# Patient Record
Sex: Male | Born: 1935 | Race: White | Hispanic: No | Marital: Married | Smoking: Never smoker
Health system: Southern US, Community
[De-identification: ages and names within clinical notes are randomized; demographics above are authoritative.]

## PROBLEM LIST (undated history)

## (undated) DIAGNOSIS — I472 Ventricular tachycardia, unspecified: Secondary | ICD-10-CM

## (undated) DIAGNOSIS — F32A Depression, unspecified: Secondary | ICD-10-CM

## (undated) DIAGNOSIS — M1990 Unspecified osteoarthritis, unspecified site: Secondary | ICD-10-CM

## (undated) DIAGNOSIS — I4821 Permanent atrial fibrillation: Secondary | ICD-10-CM

## (undated) DIAGNOSIS — I10 Essential (primary) hypertension: Secondary | ICD-10-CM

## (undated) DIAGNOSIS — I2510 Atherosclerotic heart disease of native coronary artery without angina pectoris: Secondary | ICD-10-CM

## (undated) DIAGNOSIS — I255 Ischemic cardiomyopathy: Secondary | ICD-10-CM

## (undated) DIAGNOSIS — N400 Enlarged prostate without lower urinary tract symptoms: Secondary | ICD-10-CM

## (undated) DIAGNOSIS — K219 Gastro-esophageal reflux disease without esophagitis: Secondary | ICD-10-CM

## (undated) DIAGNOSIS — N62 Hypertrophy of breast: Secondary | ICD-10-CM

## (undated) DIAGNOSIS — F329 Major depressive disorder, single episode, unspecified: Secondary | ICD-10-CM

## (undated) DIAGNOSIS — E785 Hyperlipidemia, unspecified: Secondary | ICD-10-CM

## (undated) DIAGNOSIS — F419 Anxiety disorder, unspecified: Secondary | ICD-10-CM

## (undated) HISTORY — DX: Major depressive disorder, single episode, unspecified: F32.9

## (undated) HISTORY — PX: CATARACT EXTRACTION, BILATERAL: SHX1313

## (undated) HISTORY — DX: Gastro-esophageal reflux disease without esophagitis: K21.9

## (undated) HISTORY — DX: Depression, unspecified: F32.A

## (undated) HISTORY — DX: Ischemic cardiomyopathy: I25.5

## (undated) HISTORY — DX: Ventricular tachycardia: I47.2

## (undated) HISTORY — DX: Hypertrophy of breast: N62

## (undated) HISTORY — DX: Anxiety disorder, unspecified: F41.9

## (undated) HISTORY — DX: Essential (primary) hypertension: I10

## (undated) HISTORY — DX: Hyperlipidemia, unspecified: E78.5

## (undated) HISTORY — PX: ABLATION OF DYSRHYTHMIC FOCUS: SHX254

## (undated) HISTORY — PX: CARDIAC DEFIBRILLATOR REMOVAL: SHX1291

## (undated) HISTORY — DX: Atherosclerotic heart disease of native coronary artery without angina pectoris: I25.10

## (undated) HISTORY — DX: Benign prostatic hyperplasia without lower urinary tract symptoms: N40.0

## (undated) HISTORY — DX: Ventricular tachycardia, unspecified: I47.20

## (undated) HISTORY — DX: Unspecified osteoarthritis, unspecified site: M19.90

---

## 1998-10-04 HISTORY — PX: CORONARY ARTERY BYPASS GRAFT: SHX141

## 1999-02-12 ENCOUNTER — Inpatient Hospital Stay (HOSPITAL_COMMUNITY): Admission: AD | Admit: 1999-02-12 | Discharge: 1999-02-20 | Payer: Self-pay | Admitting: Cardiology

## 1999-02-16 ENCOUNTER — Encounter: Payer: Self-pay | Admitting: Cardiothoracic Surgery

## 1999-02-17 ENCOUNTER — Encounter: Payer: Self-pay | Admitting: Cardiothoracic Surgery

## 1999-02-18 ENCOUNTER — Encounter: Payer: Self-pay | Admitting: Cardiothoracic Surgery

## 2000-05-01 ENCOUNTER — Inpatient Hospital Stay (HOSPITAL_COMMUNITY): Admission: EM | Admit: 2000-05-01 | Discharge: 2000-05-06 | Payer: Self-pay | Admitting: Cardiology

## 2000-05-02 ENCOUNTER — Encounter: Payer: Self-pay | Admitting: Cardiology

## 2000-05-06 ENCOUNTER — Encounter: Payer: Self-pay | Admitting: Cardiology

## 2001-06-10 ENCOUNTER — Inpatient Hospital Stay (HOSPITAL_COMMUNITY): Admission: EM | Admit: 2001-06-10 | Discharge: 2001-06-27 | Payer: Self-pay | Admitting: Cardiology

## 2001-06-22 ENCOUNTER — Encounter: Payer: Self-pay | Admitting: Cardiothoracic Surgery

## 2001-06-26 ENCOUNTER — Encounter: Payer: Self-pay | Admitting: Cardiology

## 2001-06-27 ENCOUNTER — Encounter: Payer: Self-pay | Admitting: Internal Medicine

## 2002-05-30 ENCOUNTER — Ambulatory Visit (HOSPITAL_COMMUNITY): Admission: RE | Admit: 2002-05-30 | Discharge: 2002-05-31 | Payer: Self-pay | Admitting: Cardiology

## 2002-05-30 ENCOUNTER — Encounter: Payer: Self-pay | Admitting: Internal Medicine

## 2002-08-24 ENCOUNTER — Encounter: Admission: RE | Admit: 2002-08-24 | Discharge: 2002-08-24 | Payer: Self-pay | Admitting: Gastroenterology

## 2002-08-24 ENCOUNTER — Encounter: Payer: Self-pay | Admitting: Gastroenterology

## 2004-08-28 ENCOUNTER — Ambulatory Visit: Payer: Self-pay | Admitting: Internal Medicine

## 2004-11-11 ENCOUNTER — Ambulatory Visit: Payer: Self-pay | Admitting: Internal Medicine

## 2005-03-08 ENCOUNTER — Ambulatory Visit: Payer: Self-pay | Admitting: Internal Medicine

## 2005-06-14 ENCOUNTER — Ambulatory Visit: Payer: Self-pay | Admitting: Internal Medicine

## 2005-09-14 ENCOUNTER — Ambulatory Visit: Payer: Self-pay | Admitting: Internal Medicine

## 2005-10-04 HISTORY — PX: US ECHOCARDIOGRAPHY: HXRAD669

## 2005-10-06 ENCOUNTER — Encounter: Payer: Self-pay | Admitting: Cardiology

## 2005-10-18 ENCOUNTER — Ambulatory Visit (HOSPITAL_COMMUNITY): Admission: RE | Admit: 2005-10-18 | Discharge: 2005-10-18 | Payer: Self-pay | Admitting: Cardiology

## 2006-08-16 ENCOUNTER — Encounter: Admission: RE | Admit: 2006-08-16 | Discharge: 2006-08-16 | Payer: Self-pay | Admitting: Cardiology

## 2007-06-30 ENCOUNTER — Ambulatory Visit: Payer: Self-pay | Admitting: Internal Medicine

## 2007-09-11 ENCOUNTER — Ambulatory Visit: Payer: Self-pay

## 2007-12-13 ENCOUNTER — Ambulatory Visit: Payer: Self-pay

## 2008-03-14 ENCOUNTER — Ambulatory Visit: Payer: Self-pay

## 2008-07-01 ENCOUNTER — Encounter: Payer: Self-pay | Admitting: Cardiology

## 2008-07-02 ENCOUNTER — Ambulatory Visit: Payer: Self-pay | Admitting: Internal Medicine

## 2008-09-18 ENCOUNTER — Ambulatory Visit: Payer: Self-pay

## 2008-11-07 ENCOUNTER — Encounter: Payer: Self-pay | Admitting: Internal Medicine

## 2008-11-21 ENCOUNTER — Ambulatory Visit: Payer: Self-pay

## 2008-12-02 HISTORY — PX: CARDIAC CATHETERIZATION: SHX172

## 2008-12-11 ENCOUNTER — Ambulatory Visit: Payer: Self-pay | Admitting: Internal Medicine

## 2008-12-11 LAB — CONVERTED CEMR LAB
Basophils Absolute: 0 10*3/uL (ref 0.0–0.1)
Basophils Relative: 0.7 % (ref 0.0–3.0)
CO2: 29 meq/L (ref 19–32)
Calcium: 9.2 mg/dL (ref 8.4–10.5)
Chloride: 102 meq/L (ref 96–112)
Glucose, Bld: 207 mg/dL — ABNORMAL HIGH (ref 70–99)
Hemoglobin: 13.3 g/dL (ref 13.0–17.0)
INR: 2.8 — ABNORMAL HIGH (ref 0.8–1.0)
Lymphocytes Relative: 17.4 % (ref 12.0–46.0)
MCHC: 34.3 g/dL (ref 30.0–36.0)
Monocytes Relative: 5 % (ref 3.0–12.0)
Neutro Abs: 5 10*3/uL (ref 1.4–7.7)
Neutrophils Relative %: 75.8 % (ref 43.0–77.0)
Prothrombin Time: 28.3 s — ABNORMAL HIGH (ref 10.9–13.3)
RBC: 4.41 M/uL (ref 4.22–5.81)
RDW: 14.2 % (ref 11.5–14.6)
Sodium: 139 meq/L (ref 135–145)
aPTT: 39.8 s — ABNORMAL HIGH (ref 21.7–29.8)

## 2008-12-19 ENCOUNTER — Inpatient Hospital Stay (HOSPITAL_COMMUNITY): Admission: RE | Admit: 2008-12-19 | Discharge: 2009-01-02 | Payer: Self-pay | Admitting: Internal Medicine

## 2008-12-19 ENCOUNTER — Ambulatory Visit: Payer: Self-pay | Admitting: Internal Medicine

## 2008-12-25 ENCOUNTER — Ambulatory Visit: Payer: Self-pay | Admitting: Thoracic Surgery (Cardiothoracic Vascular Surgery)

## 2008-12-25 ENCOUNTER — Encounter: Payer: Self-pay | Admitting: Thoracic Surgery (Cardiothoracic Vascular Surgery)

## 2008-12-25 HISTORY — PX: CARDIAC CATHETERIZATION: SHX172

## 2009-01-09 ENCOUNTER — Ambulatory Visit: Payer: Self-pay | Admitting: Internal Medicine

## 2009-01-09 DIAGNOSIS — I238 Other current complications following acute myocardial infarction: Secondary | ICD-10-CM | POA: Insufficient documentation

## 2009-01-09 DIAGNOSIS — D649 Anemia, unspecified: Secondary | ICD-10-CM

## 2009-01-09 DIAGNOSIS — E785 Hyperlipidemia, unspecified: Secondary | ICD-10-CM

## 2009-01-09 DIAGNOSIS — I472 Ventricular tachycardia, unspecified: Secondary | ICD-10-CM | POA: Insufficient documentation

## 2009-01-09 DIAGNOSIS — Z8719 Personal history of other diseases of the digestive system: Secondary | ICD-10-CM

## 2009-01-09 DIAGNOSIS — I219 Acute myocardial infarction, unspecified: Secondary | ICD-10-CM | POA: Insufficient documentation

## 2009-01-09 DIAGNOSIS — I10 Essential (primary) hypertension: Secondary | ICD-10-CM | POA: Insufficient documentation

## 2009-01-09 LAB — CONVERTED CEMR LAB
Basophils Absolute: 0 10*3/uL (ref 0.0–0.1)
Basophils Relative: 0.1 % (ref 0.0–3.0)
Eosinophils Absolute: 0.1 10*3/uL (ref 0.0–0.7)
Eosinophils Relative: 3.4 % (ref 0.0–5.0)
HCT: 34.1 % — ABNORMAL LOW (ref 39.0–52.0)
Hemoglobin: 11.8 g/dL — ABNORMAL LOW (ref 13.0–17.0)
Lymphocytes Relative: 18.5 % (ref 12.0–46.0)
Lymphs Abs: 0.8 10*3/uL (ref 0.7–4.0)
MCHC: 34.6 g/dL (ref 30.0–36.0)
MCV: 90.5 fL (ref 78.0–100.0)
Monocytes Absolute: 0.4 10*3/uL (ref 0.1–1.0)
Monocytes Relative: 10.3 % (ref 3.0–12.0)
Neutro Abs: 2.9 10*3/uL (ref 1.4–7.7)
Neutrophils Relative %: 67.7 % (ref 43.0–77.0)
Platelets: 263 10*3/uL (ref 150.0–400.0)
RBC: 3.76 M/uL — ABNORMAL LOW (ref 4.22–5.81)
RDW: 16.7 % — ABNORMAL HIGH (ref 11.5–14.6)
WBC: 4.2 10*3/uL — ABNORMAL LOW (ref 4.5–10.5)

## 2009-01-16 ENCOUNTER — Ambulatory Visit: Payer: Self-pay

## 2009-01-16 ENCOUNTER — Encounter: Payer: Self-pay | Admitting: Internal Medicine

## 2009-02-12 ENCOUNTER — Telehealth: Payer: Self-pay | Admitting: Internal Medicine

## 2009-02-19 ENCOUNTER — Telehealth: Payer: Self-pay | Admitting: Internal Medicine

## 2009-02-20 ENCOUNTER — Encounter: Payer: Self-pay | Admitting: Internal Medicine

## 2009-02-26 ENCOUNTER — Ambulatory Visit: Payer: Self-pay | Admitting: Internal Medicine

## 2009-02-26 DIAGNOSIS — I255 Ischemic cardiomyopathy: Secondary | ICD-10-CM

## 2009-02-26 DIAGNOSIS — R259 Unspecified abnormal involuntary movements: Secondary | ICD-10-CM | POA: Insufficient documentation

## 2009-02-26 DIAGNOSIS — I5023 Acute on chronic systolic (congestive) heart failure: Secondary | ICD-10-CM | POA: Insufficient documentation

## 2009-02-26 DIAGNOSIS — I495 Sick sinus syndrome: Secondary | ICD-10-CM

## 2009-03-04 ENCOUNTER — Encounter: Payer: Self-pay | Admitting: Internal Medicine

## 2009-04-09 ENCOUNTER — Encounter: Payer: Self-pay | Admitting: Internal Medicine

## 2009-06-18 ENCOUNTER — Encounter (INDEPENDENT_AMBULATORY_CARE_PROVIDER_SITE_OTHER): Payer: Self-pay | Admitting: *Deleted

## 2009-07-28 ENCOUNTER — Encounter (INDEPENDENT_AMBULATORY_CARE_PROVIDER_SITE_OTHER): Payer: Self-pay | Admitting: *Deleted

## 2010-05-19 ENCOUNTER — Ambulatory Visit: Payer: Self-pay | Admitting: Cardiology

## 2010-06-02 ENCOUNTER — Ambulatory Visit: Payer: Self-pay | Admitting: Cardiology

## 2010-06-30 ENCOUNTER — Ambulatory Visit: Payer: Self-pay | Admitting: Cardiology

## 2010-07-28 ENCOUNTER — Ambulatory Visit: Payer: Self-pay | Admitting: Cardiology

## 2010-07-31 ENCOUNTER — Ambulatory Visit: Payer: Self-pay | Admitting: Cardiovascular Disease

## 2010-08-11 ENCOUNTER — Ambulatory Visit: Payer: Self-pay | Admitting: Cardiovascular Disease

## 2010-09-02 ENCOUNTER — Ambulatory Visit: Payer: Self-pay | Admitting: Cardiology

## 2010-10-02 ENCOUNTER — Ambulatory Visit: Payer: Self-pay | Admitting: Cardiology

## 2010-11-02 ENCOUNTER — Ambulatory Visit: Payer: Self-pay | Admitting: Cardiology

## 2010-12-01 ENCOUNTER — Ambulatory Visit (INDEPENDENT_AMBULATORY_CARE_PROVIDER_SITE_OTHER): Payer: Medicare Other | Admitting: Cardiology

## 2010-12-01 ENCOUNTER — Encounter: Payer: Self-pay | Admitting: Cardiology

## 2010-12-01 DIAGNOSIS — I5021 Acute systolic (congestive) heart failure: Secondary | ICD-10-CM

## 2010-12-01 DIAGNOSIS — Z7901 Long term (current) use of anticoagulants: Secondary | ICD-10-CM

## 2010-12-01 DIAGNOSIS — I2510 Atherosclerotic heart disease of native coronary artery without angina pectoris: Secondary | ICD-10-CM

## 2010-12-17 ENCOUNTER — Other Ambulatory Visit: Payer: Self-pay | Admitting: Orthopaedic Surgery

## 2010-12-17 DIAGNOSIS — M48061 Spinal stenosis, lumbar region without neurogenic claudication: Secondary | ICD-10-CM

## 2010-12-25 ENCOUNTER — Ambulatory Visit
Admission: RE | Admit: 2010-12-25 | Discharge: 2010-12-25 | Disposition: A | Discharge status: Home / Self Care | Payer: Medicare Other | Source: Ambulatory Visit | Attending: Orthopaedic Surgery | Admitting: Orthopaedic Surgery

## 2010-12-25 DIAGNOSIS — M48061 Spinal stenosis, lumbar region without neurogenic claudication: Secondary | ICD-10-CM

## 2010-12-25 MED ORDER — GADOBENATE DIMEGLUMINE 529 MG/ML IV SOLN
20.0000 mL | Freq: Once | INTRAVENOUS | Status: AC | PRN
  Administered 2010-12-25: 20 mL via INTRAVENOUS

## 2010-12-29 ENCOUNTER — Ambulatory Visit (INDEPENDENT_AMBULATORY_CARE_PROVIDER_SITE_OTHER): Payer: Medicare Other | Admitting: *Deleted

## 2010-12-29 DIAGNOSIS — I2510 Atherosclerotic heart disease of native coronary artery without angina pectoris: Secondary | ICD-10-CM

## 2010-12-29 DIAGNOSIS — I4891 Unspecified atrial fibrillation: Secondary | ICD-10-CM | POA: Insufficient documentation

## 2010-12-29 DIAGNOSIS — Z7901 Long term (current) use of anticoagulants: Secondary | ICD-10-CM

## 2010-12-29 LAB — POCT INR: INR: 3.5

## 2011-01-12 ENCOUNTER — Ambulatory Visit (INDEPENDENT_AMBULATORY_CARE_PROVIDER_SITE_OTHER): Payer: Medicare Other | Admitting: *Deleted

## 2011-01-12 ENCOUNTER — Encounter: Payer: Medicare Other | Admitting: *Deleted

## 2011-01-12 DIAGNOSIS — I4891 Unspecified atrial fibrillation: Secondary | ICD-10-CM

## 2011-01-13 LAB — CBC
HCT: 28.3 % — ABNORMAL LOW (ref 39.0–52.0)
Hemoglobin: 9.9 g/dL — ABNORMAL LOW (ref 13.0–17.0)
MCV: 90.1 fL (ref 78.0–100.0)
RDW: 16.3 % — ABNORMAL HIGH (ref 11.5–15.5)

## 2011-01-13 LAB — BASIC METABOLIC PANEL
BUN: 14 mg/dL (ref 6–23)
CO2: 24 mEq/L (ref 19–32)
Chloride: 104 mEq/L (ref 96–112)
GFR calc non Af Amer: 51 mL/min — ABNORMAL LOW (ref >60)
Glucose, Bld: 94 mg/dL (ref 70–99)
Potassium: 3.8 mEq/L (ref 3.5–5.1)
Sodium: 133 mEq/L — ABNORMAL LOW (ref 135–145)

## 2011-01-13 LAB — GLUCOSE, CAPILLARY: Glucose-Capillary: 110 mg/dL — ABNORMAL HIGH (ref 70–99)

## 2011-01-14 LAB — GLUCOSE, CAPILLARY
Glucose-Capillary: 105 mg/dL — ABNORMAL HIGH (ref 70–99)
Glucose-Capillary: 108 mg/dL — ABNORMAL HIGH (ref 70–99)
Glucose-Capillary: 109 mg/dL — ABNORMAL HIGH (ref 70–99)
Glucose-Capillary: 115 mg/dL — ABNORMAL HIGH (ref 70–99)
Glucose-Capillary: 130 mg/dL — ABNORMAL HIGH (ref 70–99)
Glucose-Capillary: 132 mg/dL — ABNORMAL HIGH (ref 70–99)
Glucose-Capillary: 132 mg/dL — ABNORMAL HIGH (ref 70–99)
Glucose-Capillary: 132 mg/dL — ABNORMAL HIGH (ref 70–99)
Glucose-Capillary: 133 mg/dL — ABNORMAL HIGH (ref 70–99)
Glucose-Capillary: 134 mg/dL — ABNORMAL HIGH (ref 70–99)
Glucose-Capillary: 138 mg/dL — ABNORMAL HIGH (ref 70–99)
Glucose-Capillary: 139 mg/dL — ABNORMAL HIGH (ref 70–99)
Glucose-Capillary: 140 mg/dL — ABNORMAL HIGH (ref 70–99)
Glucose-Capillary: 140 mg/dL — ABNORMAL HIGH (ref 70–99)
Glucose-Capillary: 145 mg/dL — ABNORMAL HIGH (ref 70–99)
Glucose-Capillary: 145 mg/dL — ABNORMAL HIGH (ref 70–99)
Glucose-Capillary: 147 mg/dL — ABNORMAL HIGH (ref 70–99)
Glucose-Capillary: 147 mg/dL — ABNORMAL HIGH (ref 70–99)
Glucose-Capillary: 148 mg/dL — ABNORMAL HIGH (ref 70–99)
Glucose-Capillary: 150 mg/dL — ABNORMAL HIGH (ref 70–99)
Glucose-Capillary: 151 mg/dL — ABNORMAL HIGH (ref 70–99)
Glucose-Capillary: 154 mg/dL — ABNORMAL HIGH (ref 70–99)
Glucose-Capillary: 158 mg/dL — ABNORMAL HIGH (ref 70–99)
Glucose-Capillary: 162 mg/dL — ABNORMAL HIGH (ref 70–99)
Glucose-Capillary: 164 mg/dL — ABNORMAL HIGH (ref 70–99)
Glucose-Capillary: 167 mg/dL — ABNORMAL HIGH (ref 70–99)
Glucose-Capillary: 168 mg/dL — ABNORMAL HIGH (ref 70–99)
Glucose-Capillary: 171 mg/dL — ABNORMAL HIGH (ref 70–99)
Glucose-Capillary: 225 mg/dL — ABNORMAL HIGH (ref 70–99)
Glucose-Capillary: 257 mg/dL — ABNORMAL HIGH (ref 70–99)
Glucose-Capillary: 80 mg/dL (ref 70–99)

## 2011-01-14 LAB — BASIC METABOLIC PANEL
BUN: 13 mg/dL (ref 6–23)
BUN: 15 mg/dL (ref 6–23)
BUN: 15 mg/dL (ref 6–23)
BUN: 15 mg/dL (ref 6–23)
BUN: 20 mg/dL (ref 6–23)
Calcium: 8 mg/dL — ABNORMAL LOW (ref 8.4–10.5)
Calcium: 8.2 mg/dL — ABNORMAL LOW (ref 8.4–10.5)
Calcium: 8.2 mg/dL — ABNORMAL LOW (ref 8.4–10.5)
Calcium: 8.8 mg/dL (ref 8.4–10.5)
Chloride: 104 mEq/L (ref 96–112)
Chloride: 104 mEq/L (ref 96–112)
Chloride: 105 mEq/L (ref 96–112)
Chloride: 107 mEq/L (ref 96–112)
Creatinine, Ser: 1.18 mg/dL (ref 0.4–1.5)
Creatinine, Ser: 1.19 mg/dL (ref 0.4–1.5)
Creatinine, Ser: 1.21 mg/dL (ref 0.4–1.5)
Creatinine, Ser: 1.26 mg/dL (ref 0.4–1.5)
Creatinine, Ser: 1.29 mg/dL (ref 0.4–1.5)
GFR calc Af Amer: >60 mL/min (ref >60)
GFR calc Af Amer: >60 mL/min (ref >60)
GFR calc Af Amer: >60 mL/min (ref >60)
GFR calc Af Amer: >60 mL/min (ref >60)
GFR calc non Af Amer: 51 mL/min — ABNORMAL LOW (ref >60)
GFR calc non Af Amer: 52 mL/min — ABNORMAL LOW (ref >60)
GFR calc non Af Amer: 55 mL/min — ABNORMAL LOW (ref >60)
GFR calc non Af Amer: 59 mL/min — ABNORMAL LOW (ref >60)
GFR calc non Af Amer: >60 mL/min (ref >60)
GFR calc non Af Amer: >60 mL/min (ref >60)
GFR calc non Af Amer: >60 mL/min (ref >60)
Glucose, Bld: 129 mg/dL — ABNORMAL HIGH (ref 70–99)
Glucose, Bld: 133 mg/dL — ABNORMAL HIGH (ref 70–99)
Glucose, Bld: 148 mg/dL — ABNORMAL HIGH (ref 70–99)
Glucose, Bld: 158 mg/dL — ABNORMAL HIGH (ref 70–99)
Glucose, Bld: 235 mg/dL — ABNORMAL HIGH (ref 70–99)
Glucose, Bld: 90 mg/dL (ref 70–99)
Potassium: 3.3 mEq/L — ABNORMAL LOW (ref 3.5–5.1)
Potassium: 3.7 mEq/L (ref 3.5–5.1)
Potassium: 4.2 mEq/L (ref 3.5–5.1)
Sodium: 134 mEq/L — ABNORMAL LOW (ref 135–145)
Sodium: 135 mEq/L (ref 135–145)
Sodium: 137 mEq/L (ref 135–145)
Sodium: 138 mEq/L (ref 135–145)

## 2011-01-14 LAB — CBC
HCT: 24 % — ABNORMAL LOW (ref 39.0–52.0)
HCT: 28.2 % — ABNORMAL LOW (ref 39.0–52.0)
HCT: 28.6 % — ABNORMAL LOW (ref 39.0–52.0)
HCT: 28.9 % — ABNORMAL LOW (ref 39.0–52.0)
HCT: 30.8 % — ABNORMAL LOW (ref 39.0–52.0)
Hemoglobin: 12.7 g/dL — ABNORMAL LOW (ref 13.0–17.0)
Hemoglobin: 8.9 g/dL — ABNORMAL LOW (ref 13.0–17.0)
Hemoglobin: 9 g/dL — ABNORMAL LOW (ref 13.0–17.0)
Hemoglobin: 9.6 g/dL — ABNORMAL LOW (ref 13.0–17.0)
Hemoglobin: 9.8 g/dL — ABNORMAL LOW (ref 13.0–17.0)
MCHC: 34.3 g/dL (ref 30.0–36.0)
MCHC: 34.4 g/dL (ref 30.0–36.0)
MCHC: 34.8 g/dL (ref 30.0–36.0)
MCHC: 35 g/dL (ref 30.0–36.0)
MCV: 87.8 fL (ref 78.0–100.0)
MCV: 87.9 fL (ref 78.0–100.0)
MCV: 88.1 fL (ref 78.0–100.0)
MCV: 88.5 fL (ref 78.0–100.0)
MCV: 88.6 fL (ref 78.0–100.0)
MCV: 89.1 fL (ref 78.0–100.0)
MCV: 89.2 fL (ref 78.0–100.0)
MCV: 89.8 fL (ref 78.0–100.0)
Platelets: 116 10*3/uL — ABNORMAL LOW (ref 150–400)
Platelets: 147 10*3/uL — ABNORMAL LOW (ref 150–400)
Platelets: 150 10*3/uL (ref 150–400)
Platelets: 181 10*3/uL (ref 150–400)
Platelets: 203 10*3/uL (ref 150–400)
Platelets: 250 10*3/uL (ref 150–400)
Platelets: 251 10*3/uL (ref 150–400)
RBC: 2.89 MIL/uL — ABNORMAL LOW (ref 4.22–5.81)
RBC: 3.1 MIL/uL — ABNORMAL LOW (ref 4.22–5.81)
RBC: 3.15 MIL/uL — ABNORMAL LOW (ref 4.22–5.81)
RBC: 3.16 MIL/uL — ABNORMAL LOW (ref 4.22–5.81)
RBC: 3.24 MIL/uL — ABNORMAL LOW (ref 4.22–5.81)
RBC: 4.11 MIL/uL — ABNORMAL LOW (ref 4.22–5.81)
RDW: 14.9 % (ref 11.5–15.5)
RDW: 14.9 % (ref 11.5–15.5)
RDW: 15 % (ref 11.5–15.5)
RDW: 15.2 % (ref 11.5–15.5)
RDW: 15.6 % — ABNORMAL HIGH (ref 11.5–15.5)
RDW: 15.6 % — ABNORMAL HIGH (ref 11.5–15.5)
RDW: 15.7 % — ABNORMAL HIGH (ref 11.5–15.5)
RDW: 16.2 % — ABNORMAL HIGH (ref 11.5–15.5)
WBC: 4.4 10*3/uL (ref 4.0–10.5)
WBC: 5.8 10*3/uL (ref 4.0–10.5)
WBC: 7 10*3/uL (ref 4.0–10.5)
WBC: 7.7 10*3/uL (ref 4.0–10.5)
WBC: 8.1 10*3/uL (ref 4.0–10.5)
WBC: 8.4 10*3/uL (ref 4.0–10.5)
WBC: 9.9 10*3/uL (ref 4.0–10.5)

## 2011-01-14 LAB — POCT I-STAT 7, (LYTES, BLD GAS, ICA,H+H)
HCT: 32 % — ABNORMAL LOW (ref 39.0–52.0)
Hemoglobin: 10.9 g/dL — ABNORMAL LOW (ref 13.0–17.0)
Sodium: 138 mEq/L (ref 135–145)
pCO2 arterial: 39.2 mmHg (ref 35.0–45.0)
pH, Arterial: 7.4 (ref 7.350–7.450)
pO2, Arterial: 323 mmHg — ABNORMAL HIGH (ref 80.0–100.0)

## 2011-01-14 LAB — TYPE AND SCREEN

## 2011-01-14 LAB — HEPARIN LEVEL (UNFRACTIONATED)
Heparin Unfractionated: 0.11 IU/mL — ABNORMAL LOW (ref 0.30–0.70)
Heparin Unfractionated: 0.12 IU/mL — ABNORMAL LOW (ref 0.30–0.70)
Heparin Unfractionated: 0.15 IU/mL — ABNORMAL LOW (ref 0.30–0.70)
Heparin Unfractionated: 0.23 IU/mL — ABNORMAL LOW (ref 0.30–0.70)
Heparin Unfractionated: 0.31 IU/mL (ref 0.30–0.70)
Heparin Unfractionated: 0.42 IU/mL (ref 0.30–0.70)
Heparin Unfractionated: <0.1 IU/mL — ABNORMAL LOW (ref 0.30–0.70)
Heparin Unfractionated: <0.1 IU/mL — ABNORMAL LOW (ref 0.30–0.70)

## 2011-01-14 LAB — VANCOMYCIN, TROUGH: Vancomycin Tr: 33.7 ug/mL (ref 10.0–20.0)

## 2011-01-14 LAB — PROTIME-INR
INR: 1.4 (ref 0.00–1.49)
INR: 1.4 (ref 0.00–1.49)
INR: 1.6 — ABNORMAL HIGH (ref 0.00–1.49)
INR: 1.6 — ABNORMAL HIGH (ref 0.00–1.49)
INR: 1.6 — ABNORMAL HIGH (ref 0.00–1.49)
INR: 1.9 — ABNORMAL HIGH (ref 0.00–1.49)
INR: 2.5 — ABNORMAL HIGH (ref 0.00–1.49)
Prothrombin Time: 18.1 seconds — ABNORMAL HIGH (ref 11.6–15.2)
Prothrombin Time: 19.9 seconds — ABNORMAL HIGH (ref 11.6–15.2)
Prothrombin Time: 22.5 seconds — ABNORMAL HIGH (ref 11.6–15.2)
Prothrombin Time: 23.3 seconds — ABNORMAL HIGH (ref 11.6–15.2)
Prothrombin Time: 26.1 seconds — ABNORMAL HIGH (ref 11.6–15.2)

## 2011-01-14 LAB — DIFFERENTIAL
Basophils Absolute: 0 10*3/uL (ref 0.0–0.1)
Basophils Absolute: 0 10*3/uL (ref 0.0–0.1)
Basophils Absolute: 0 10*3/uL (ref 0.0–0.1)
Basophils Relative: 1 % (ref 0–1)
Eosinophils Absolute: 0 10*3/uL (ref 0.0–0.7)
Eosinophils Absolute: 0 10*3/uL (ref 0.0–0.7)
Eosinophils Absolute: 0 10*3/uL (ref 0.0–0.7)
Eosinophils Absolute: 0.1 10*3/uL (ref 0.0–0.7)
Eosinophils Absolute: 0.2 10*3/uL (ref 0.0–0.7)
Eosinophils Relative: 0 % (ref 0–5)
Eosinophils Relative: 1 % (ref 0–5)
Eosinophils Relative: 1 % (ref 0–5)
Eosinophils Relative: 1 % (ref 0–5)
Lymphocytes Relative: 4 % — ABNORMAL LOW (ref 12–46)
Lymphocytes Relative: 8 % — ABNORMAL LOW (ref 12–46)
Lymphocytes Relative: 8 % — ABNORMAL LOW (ref 12–46)
Lymphs Abs: 0.6 10*3/uL — ABNORMAL LOW (ref 0.7–4.0)
Lymphs Abs: 0.8 10*3/uL (ref 0.7–4.0)
Lymphs Abs: 0.8 10*3/uL (ref 0.7–4.0)
Lymphs Abs: 0.8 10*3/uL (ref 0.7–4.0)
Monocytes Absolute: 0.4 10*3/uL (ref 0.1–1.0)
Monocytes Relative: 10 % (ref 3–12)
Neutro Abs: 6.5 10*3/uL (ref 1.7–7.7)
Neutrophils Relative %: 77 % (ref 43–77)
Neutrophils Relative %: 80 % — ABNORMAL HIGH (ref 43–77)
Neutrophils Relative %: 83 % — ABNORMAL HIGH (ref 43–77)
Neutrophils Relative %: 86 % — ABNORMAL HIGH (ref 43–77)

## 2011-01-14 LAB — POCT I-STAT GLUCOSE: Glucose, Bld: 167 mg/dL — ABNORMAL HIGH (ref 70–99)

## 2011-01-14 LAB — COMPREHENSIVE METABOLIC PANEL
AST: 20 U/L (ref 0–37)
CO2: 25 mEq/L (ref 19–32)
Calcium: 8.4 mg/dL (ref 8.4–10.5)
Creatinine, Ser: 1.33 mg/dL (ref 0.4–1.5)
GFR calc Af Amer: >60 mL/min (ref >60)
GFR calc non Af Amer: 53 mL/min — ABNORMAL LOW (ref >60)
Sodium: 139 mEq/L (ref 135–145)
Total Protein: 5.3 g/dL — ABNORMAL LOW (ref 6.0–8.3)

## 2011-01-14 LAB — WOUND CULTURE

## 2011-01-14 LAB — VANCOMYCIN, RANDOM: Vancomycin Rm: 20.1 ug/mL

## 2011-01-14 LAB — MAGNESIUM: Magnesium: 2.3 mg/dL (ref 1.5–2.5)

## 2011-01-14 LAB — RETICULOCYTES: Retic Count, Absolute: 87.8 10*3/uL (ref 19.0–186.0)

## 2011-02-02 ENCOUNTER — Other Ambulatory Visit: Payer: Self-pay | Admitting: *Deleted

## 2011-02-02 ENCOUNTER — Ambulatory Visit (INDEPENDENT_AMBULATORY_CARE_PROVIDER_SITE_OTHER): Payer: Medicare Other | Admitting: *Deleted

## 2011-02-02 DIAGNOSIS — Z79899 Other long term (current) drug therapy: Secondary | ICD-10-CM

## 2011-02-02 DIAGNOSIS — I4891 Unspecified atrial fibrillation: Secondary | ICD-10-CM

## 2011-02-02 LAB — POCT INR: INR: 2.6

## 2011-02-16 NOTE — Discharge Summary (Signed)
[name], [name]                ACCOUNT NO.:  [id number]   MEDICAL RECORD NO.:  [id number]          PATIENT TYPE:  INP   LOCATION:  2006                         FACILITY:  [facility]   PHYSICIAN:  Colleen Can. Deborah Chalk, M.D.DATE OF BIRTH:  Feb 27, 1935   DATE OF ADMISSION:  12/19/2008  DATE OF DISCHARGE:  01/02/2009                               DISCHARGE SUMMARY   DISCHARGE DIAGNOSES:  1. Original implantable cardioverter-defibrillator upgrade with left      ventricular lead placement with subsequent implantable cardioverter-      defibrillator extraction secondary to noted abscess of the pocket.  2. Repeated attempts for lead extraction on December 20, 2008, December 23, 2008, and again on December 26, 2008.  3. Known ischemic cardiomyopathy with chronic left ventricular      systolic failure.  4. Coronary artery disease with remote anterior apical myocardial      infarction with remote coronary artery bypass grafting x5 in 2000.      He has undergone repeat cardiac catheterization in this admission      on December 25, 2008.  The bypass grafts are patent.  5. Severe left ventricular dysfunction, ejection fraction is 23%.  6. Chronic anxieties and depression.  7. History of ventricular tachycardia storm.  He has had previous      ablation in 2002.  8. Chronic antiarrhythmic therapy with amiodarone.  9. Paroxysmal atrial fibrillation.  He is currently back in sinus      rhythm on higher doses of amiodarone.  10.Coumadin therapy.  11.Hyperlipidemia.  12.Hypokalemia on low-dose replacement.  13.Confusion.  14.History of sinus node dysfunction.  15.Gynecomastia.  16.Diabetes.  17.Hypertension.  18.Degenerative joint disease.  19.Anemia secondary to acute blood loss.   HISTORY OF PRESENT ILLNESS:  [name] is a 76 year old white male  who was originally brought in for upgrade of his ICD because of  congestive symptoms manifested by fatigue.  He was admitted after having  his Coumadin  held on December 19, 2008.  His device had reached end of  life.  Significant effort went into placing the LV lead.  This was very  difficult for variety of reasons.  Subsequent lead placement was  obtained and when the pocket was subsequently opened, unfortunately  there was a flow of pus.  The device subsequently was going to need to  be explanted and the patient was subsequently admitted for further  medical management.   Please see the history and physical for further patient presentation and  profile.   LABORATORY DATA ON ADMISSION:  His CBC showed a hemoglobin of 12.7,  hematocrit was 36, white count was 4000, platelets 165.  His INR on  admission was down to 1.6.  His chemistries showed a BUN of 18,  creatinine was 1.3, glucose was 138.  Portable chest x-ray on admission  showed no acute findings.   PROCEDURES PERFORMED:  1. Initial successful LV lead placement with much difficulty on December 19, 2008, however, with subsequent identification of a pocket      abscess and subsequent  extraction.  2. Attempted lead extraction on December 20, 2008, with the subcutaneous      array being explanted by Dr. Lewayne Bunting.  3. Repeat attempts at lead extraction via femoral approach on December 23, 2008, and again subsequently on December 26, 2008.  It should be      noted that the distal tip of the distal bi-pole remains imbedded in      the RV myocardium, but all other material appears to be removed.  4. Cardiac catheterization performed by Dr. Roger Shelter on December 25, 2008, showing the left main to be relatively long with      irregularities.  The LAD is totally occluded proximally.  The      intermediate has irregularities.  The right coronary is totally      occluded proximally.  Saphenous vein graft to the LAD has a 30%      proximal narrowing and adequate distal flow.  Saphenous vein graft      to the acute marginal right coronary is patent.  Saphenous vein      graft to the  OM had a valve present, but the saphenous vein graft      is patent with satisfactory flow with a 30% proximal narrowing in      the body of the vein graft.  Left internal mammary to the LAD is      patent.  The ejection fraction was estimated to be 20%.   HOSPITAL COURSE:  As stated earlier, the patient was originally brought  in to have an upgrade of his ICD; however, following successful LV lead  placement and opening of the pocket, an abscess was discovered.  Explantation subsequently began.  The patient underwent lead extraction  by Dr. Lewayne Bunting the following day.  Antibiotics had been started.  The patient was followed closely by Infectious Disease.  The cultures  have remained negative throughout this admission.  On December 20, 2008,  the atrial lead was removed, the LV lead was easily removed, the subcu  array was removed.  However, the ICD lead removal resulted in breakage  of the distal half of the lead.  Repeat attempts were carried out via  femoral approach in order to snare the remaining ICD lead on December 23, 2008.  The patient did have significant bleeding and required  intermittent transfusions.  This was felt to be due to acute blood loss.  On December 23, 2008, repeat attempts at ICD extraction was carried out.  The lead was successfully snared on 2 attempts, but twice the lead broke  during the attempt to pull it out of the heart.  The subsequent  procedure was abandoned.  Following that, it was felt that the patient  was going to need to have possible surgical intervention.  In light of  those findings, it was felt he would need to have repeat cardiac  catheterization in order to ascertain if his bypass grafts were patent  when we were looking at the possibility for redo coronary artery bypass  grafting as well.  The patient was seen in consultation by Dr. Charlett Lango, who did not feel that it would be in Mr. Osburn's best  interest.  He thought that it would  be an extremely high-risk procedure,  especially in the setting of his low ejection fraction, his chronic  mental status changes, and the fact that his bypass grafts are patent  yet overall with poor performance status.  On December 26, 2008, Dr. Ladona Ridgel  once again attempted the ICD lead extraction via the right and left  femoral vein.  This was carried out without complications.  The distal  tip of the distal bi-pole does remain imbedded in the RV myocardium, but  all other material was removed successfully.  The patient tolerated the  procedure well, subsequently transferred where we then began to work on  mobilization and towards discharge planning.  He has not required any  further blood transfusions.  His cultures continue to be negative.  He  has had some runs of atrial fibrillation which has been paroxysmal.  He  is currently back in sinus rhythm and today on January 02, 2009, he is  doing well with plans for discharge.  It will be our plan to send the  patient home with a life jacket on here for the next 2-3 weeks before he  is brought back in to be readmitted for a new device implantation.  Based on the significant difficulty of the LV lead placement, there may  be discussion as to whether we should just hold off on re-implanting Mr.  Mcgillis with a new device in general.  These discussions will occur  between Dr. Deborah Chalk and Dr. Graciela Husbands early next week.  However, the patient  will be sent home with a life jacket in place for now.  It should be  noted that the patient has had no ICD discharges since his VT ablation  in 2002.   Discharge lab shows hemoglobin is stable at 28, his INR is 2.6, his  chemistries are satisfactory.   DISCHARGE DIET:  Low salt, heart healthy.   Activity is to be increased as tolerated.  He may shower and bathe.  He  is not to drive.   Home health has been arranged to follow up for wound care.  His sutures  will need to be removed at his first visit next  week.   DISCHARGE MEDICINES:  Lipitor 10 mg a day, baby aspirin daily,  finasteride 5 mg a day, Lexapro 10 mg a day, amiodarone is 200 mg  b.i.d., Tenormin 50 mg b.i.d., Coumadin will be cut back to 2-1/2 mg  each day starting on Friday, Protonix 40 mg a day, potassium 10 mEq a  day, stool softener if needed over the counter, and Xanax as he was  taking before at home 0.5 three times a day.   We will plan on seeing him back in the office on Tuesday, January 07, 2009,  at 11 a.m.  He will have sutures removed at that time and we will do  followup lab work.  He will follow up with Dr. Odessa Fleming office on  Thursday, January 09, 2009, at 2 p.m.  By that time, we  should be able to  have a formal plan in place for possible readmission versus withdrawal  implants for repeat device implantation.   He is to call if any problems arise in the interim.   Greater than 60 minutes spent for discharge.      Sharlee Blew, N.P.      Colleen Can. Deborah Chalk, M.D.  Electronically Signed    LC/MEDQ  D:  01/02/2009  T:  01/02/2009  Job:  [id number]   cc:   Duke Salvia, MD, [facility]

## 2011-02-16 NOTE — Letter (Signed)
December 11, 2008    Colleen Can. Deborah Chalk, M.D.  [address]  [place], [place] [address]   RE:  KHALIL, SZCZEPANIK  MRN:  [id number]  /  DOB:  05/24/1935   Dear Weyman Croon,   Mr. Pembleton came in today in followup for ventricular tachycardia in  the setting of ischemic [place] at [facility] about 4 or 5 years ago, since  which time he has had no recurrent ventricular tachycardia.   His complaint now is that he has increasing fatigue.  He is unable to  walk 100 feet without having to stop.  He does not have orthopnea.  He  has had a little bit of peripheral edema.  He denies intercurrent chest  pain.   He underwent Myoview scanning at your office.  I appreciated your  filling me in on the results of that.  His ejection fraction was 23% and  there was no ischemia.   His current medications include amiodarone 100 mg three times a week,  Lexapro 10, finasteride 5, aspirin, atenolol 50, Lipitor 10 and  Coumadin.   His review of systems is notable for the intercurrent development of  gynecomastia, which is attributed to his finasteride.  I have suggested  that he call Dr. Belva Crome office about that.   HE HAS NO KNOWN DRUG ALLERGIES.   On examination his blood pressure today was 130/71.  His pulse was 61.  His weight was 232, which was stable.  His HEENT exam demonstrated no icterus, no xanthoma.  The lungs were clear.  The heart sounds were regular with a S4 and a displaced PMI.  The device  pocket was well-healed.  The abdomen was protuberant but soft.  Neck veins were about 7 cm.  Femoral pulses were 2+.  Distal pulses were  intact.  There was 1+ peripheral edema on the left, not on the right.   Electrocardiogram dated today demonstrated AV pacing with a long AV  delay of 240 milliseconds and a broad QRS with a duration of 200  milliseconds.   IMPRESSION:  1. Ischemic cardiomyopathy with:      a.     Prior bypass.      b.     Recent ejection fraction of 23%.      c.     no  ischemia.  2. Congestive heart failure - acute on chronic - systolic.  3. Ventricular tachycardia with a prior history of storm, status post      ablation.  4. Status post implantable cardioverter defibrillator for the above,      now at elective replacement indicator.  5. Sinus node dysfunction with arteriovenous block resulting in dual-      chamber pacing.  6. Gynecomastia thought to be related to finasteride.   Mr. Abdulloh, Ullom, is at [facility], and with his symptoms of progressive  heart failure and his depressed LV function and a broad QRS, I think  upgrade to a CRT device is worthy of consideration.  I have reviewed  with him the potential benefits as well as the potential risks, not  limited to infection.  The REPLACE data were very sobering  in this  regard.  He understands and would like to proceed.   We will stop his Coumadin a couple of days ahead of time.   I have suggested that he take it up with Dr. Annabell Howells about his  finasteride.   We will see him and plan to get his blood work today  in anticipation.    Sincerely,      Duke Salvia, MD, [facility]  Electronically Signed    SCK/MedQ  DD: 12/11/2008  DT: 12/11/2008  Job #: [id number]

## 2011-02-16 NOTE — Op Note (Signed)
[name], [name]                ACCOUNT NO.:  [id number]   MEDICAL RECORD NO.:  [id number]          PATIENT TYPE:  INP   LOCATION:                               FACILITY:  [facility]   PHYSICIAN:  Doylene Canning. Ladona Ridgel, MD    DATE OF BIRTH:  October 23, 1934   DATE OF PROCEDURE:  12/20/2008  DATE OF DISCHARGE:                               OPERATIVE REPORT   PROCEDURE PERFORMED:  ICD pacemaker lead and subcutaneous array  extraction without immediate procedural complication.  Of note, the  distal half of the ICD lead broke at a portion between the proximal and  distal coils and with the distal portion not being able to be removed at  this explantation.   INTRODUCTION:  The patient is a 76 year old man who was admitted to the  hospital for upgrade to a biventricular ICD.  After LV lead placement  was carried out and the ICD pocket was opened, he was found to have  purulent material draining from the pocket.  This appeared to be a  chronic abscess with an indolent infection.  Cultures were sent.  The  patient's generator was removed, and the LV lead was allowed to remain  in place, and he is now referred for removal of his transvenous pacing  leads along with the removal of a subcutaneous array.   PROCEDURE:  After informed consent was obtained, the patient was taken  to the operating room in the fasting state.  After the usual preparation  and draping and an art line placed, he was placed under general  anesthesia.  A 9-cm transverse incision was carried out in the left  infraclavicular region.  Electrocautery was utilized to dissect down to  the leads.  It should be noted that the pocket was chronically inflamed  with very dense fibrous scar tissue and small areas of purulent material  throughout the pocket.  The pocket was debrided extensively, and it was  very difficult to free up the defibrillator and other leads because of  the very extensive scar tissue demonstrated.  At this point,  initial  attention was turned to removal of the subcutaneous array.  A transverse  incision was made along the mid axillary line, and electrocautery  utilized to dissect down to the array pocket.  The array was pulled  through from the pacemaker defibrillator pocket to the mid axillary line  pocket.  At this point, there was very dense fibrous adhesions and scar  tissue present.  After approximately 20 minutes of electrocautery and  debridement, it was deemed most appropriate at this point to save the  residual dissection of the subcutaneous array for later in the  procedure.  Attention was then turned to removal of the transvenous  pacing and defibrillator leads.  Initially, the atrial lead was chosen  for attempt to removal.  A Liberator locking stylet was advanced into  the lead after a 58-cm stylet was utilized to retract the active  fixation portion of the lead.  Having accomplished this and having  locked the Liberator locking stylet into the lead body and  with a silk  suture placed on the outer insulation of the more proximal portion of  the lead, the stainless steel sheath was advanced into the left  subclavian vein.  This sheath was removed, and the 13-[facility]  electrosurgical dissection sheath was advanced into the left subclavian  vein.  There was very extensive scar tissue present.  After  approximately 20 minutes of electrocautery dissection sheath action, the  sheath had gotten just into the innominate vein.  At this point, the  sheath was removed, and attention was then to turned removal of the  defibrillator lead.  Again, a locking stylet was advanced into the lead  after the lead body was cut and a silk suture placed on the outer  insulation of the more proximal portion of the lead.  Again, the  stainless steel sheath was advanced into the left subclavian vein,  freeing up the very dense fibrous adhesions.  The 13-[facility]  electrosurgical dissection sheath was  then advanced over the  defibrillator lead.  At this time and again, there was very dense scar  tissue and at least 30 minutes of a combination of pressure,  counterpressure, traction, countertraction with the electrosurgical  dissection sheath cautery on were utilized, and with a very significant  effort the dissection sheath was allowed to advance through the  innominate vein into the superior vena cava.  Getting through the  proximal coil was very difficult but eventually accomplished, and the  electrosurgical dissection sheath was then advanced across the tricuspid  valve into the right ventricle.  There was very dense fibrous tissue  ingrowth into the distal coil of the defibrillator lead at a distance  about literally about 2 cm from the distal tip of the distal coil  utilizing a combination of pressure, counterpressure, traction,  countertraction, the lead broke and further attempts to try to remove  the defibrillator lead were unsuccessful.  At this point, attention was  then returned to the atrial lead, and again with the [facility]  electrosurgical dissection sheath, the lead was subsequently removed  without particular difficulty.  The left ventricular lead was then also  removed without difficulty.  At this point, there was a residual half of  the ICD lead with the distal coil and a portion and the more proximal  portion of the lead still left in the heart.  The electrocautery and a  malleable blade were then advanced into the mid axillary line pocket,  and utilizing electrocautery and firm pressure the 3 subcutaneous arrays  were removed in total.  It should be noted that the leads were scarred  very, very tightly, and there was very extensive fibrous tissue bound  around the subcutaneous array which had been in place for approximately  5 years.  It should be noted that during this time the patient remained  hemodynamically stable.  At this point, each wound was irrigated with   gentamicin irrigation, and electrocautery was utilized to assure  hemostasis.  Each incision was closed with 3-0 Prolene utilizing simple  mattress sutures.  Pressure dressings were placed on each wound, and the  patient was returned to the recovery area in stable condition.   COMPLICATIONS:  There were no immediate procedure complications.   RESULTS:  Successful removal of all but the distal portion of the  defibrillator lead including removal of the atrial lead including  removal of the left ventricular lead including removal of the  subcutaneous array in a patient with a chronic  indolent ICD pocket  abscess.      Doylene Canning. Ladona Ridgel, MD  Electronically Signed     GWT/MEDQ  D:  12/20/2008  T:  12/21/2008  Job:  [id number]   cc:   Colleen Can. Deborah Chalk, M.D.  Duke Salvia, MD, [facility]

## 2011-02-16 NOTE — Consult Note (Signed)
NAMETRESHON, STANNARD                ACCOUNT NO.:  [id number]   MEDICAL RECORD NO.:  [id number]          PATIENT TYPE:  INP   LOCATION:  2312                         FACILITY:  [facility]   PHYSICIAN:  Doylene Canning. Ladona Ridgel, MD    DATE OF BIRTH:  [birth year]   DATE OF CONSULTATION:  12/20/2008  DATE OF DISCHARGE:                                 CONSULTATION   REQUESTING PHYSICIAN:  Duke Salvia, MD, [facility]   INDICATIONS FOR CONSULTATION:  Evaluation of ICD pocket infection.   HISTORY OF PRESENT ILLNESS:  The patient is a 76 year old man who is  admitted to the hospital for upgrade from a dual chamber to a  biventricular ICD.  Initially with insertion of the LV lead which was  quite difficult, care was taken not to enter the prior ICD pocket.  After the lead had been placed, the ICD pocket was subsequently entered  and was found to be purulent with clear abscess formation.  This was  carried out yesterday.  The patient has had night sweats but has not  been critically ill denying fevers and chills.  He was noted to have  some swelling at his ICD insertion site.  He is now referred for  additional evaluation.   His past medical history is notable for coronary artery disease with  prior MI in 1984 and angioplasty at that time.  He has also had a  angioplasty in 2001 and bypass surgery apparently in 2002.  The patient  developed a VT and initially underwent ICD implantation in 2001 with  lead revision in 2002 and device exchange in 2003.  He subsequently  developed ventricular tachycardia, underwent ablation in 2005 and has  been fairly free with 4 ICD therapies since then.  The patient also has  a history of dyslipidemia, a history of anemia a history of prior  cataract surgery, and a history of hypertension.   SOCIAL HISTORY:  The patient is married.  Denies tobacco or ethanol  abuse.   FAMILY HISTORY:  Noncontributory.   REVIEW OF SYSTEMS:  Except for the problem of night sweats, fatigue,  and  weakness and dyspnea on exertion, all systems were negative except as  noted above.   PHYSICAL EXAMINATION:  GENERAL:  He is a pleasant, well-appearing 55-  year-old man in no acute distress.  VITAL SIGNS:  The blood pressure was 110/60, the pulse was 62 and  regular, respirations were 18, temperature is 99.  HEENT:  Normocephalic and atraumatic.  Pupils are equal and round.  Oropharynx is moist.  Sclerae are anicteric.  NECK:  No jugular distention.  CHEST:  The chest wall demonstrated an area of erythema and swelling  over his ICD insertion site with fresh suture material in place.  CARDIOVASCULAR:  Regular rate and rhythm.  Normal S1 and S2.  No  murmurs, rubs, or gallops.  The PMI was enlarged and laterally  displaced.  LUNGS:  Clear bilaterally on auscultation.  No wheezes, rales, or  rhonchi were demonstrated.  ABDOMEN:  Soft, nontender.  There was no organomegaly.  EXTREMITIES:  No cyanosis,  clubbing, or edema.  The pulses were 2+ and  symmetric.   The EKG demonstrates sinus rhythm with a fairly prolonged  intraventricular conduction delay.   IMPRESSION:  1. Implantable cardioverter-defibrillator pocket infection discovered      1 day ago at the time of attempted upgrade to a biventricular      device.  2. Congestive heart failure with severe left ventricular dysfunction,      ejection fraction varying between 20 and 35%.  3. Ischemic cardiomyopathy, status post myocardial infarction.  4. Ventricular tachycardia, status post ablation with ventricular      tachycardia being fairly quiescent since his ablation.   DISCUSSION:  I have discussed the treatment options with the patient.  The risks, benefits, goals, and expectations of ICD lead extraction has  been discussed with the patient along with removal of his subcutaneous  array.  The patient wishes to proceed with the procedure.      Doylene Canning. Ladona Ridgel, MD  Electronically Signed     GWT/MEDQ  D:  12/20/2008   T:  12/21/2008  Job:  [id number]   cc:   Colleen Can. Deborah Chalk, M.D.  Duke Salvia, MD, [facility]

## 2011-02-16 NOTE — Op Note (Signed)
[name], [name]                ACCOUNT NO.:  [id number]   MEDICAL RECORD NO.:  [id number]          PATIENT TYPE:  INP   LOCATION:  3740                         FACILITY:  [facility]   PHYSICIAN:  Duke Salvia, MD, FACCDATE OF BIRTH:  10-18-1934   DATE OF PROCEDURE:  12/19/2008  DATE OF DISCHARGE:                               OPERATIVE REPORT   PREOPERATIVE DIAGNOSIS:  Previously implanted implantable cardioverter-  defibrillator with congestive symptoms manifested by fatigue.   POSTOPERATIVE DIAGNOSES:  Previously implanted implantable cardioverter-  defibrillator with congestive symptoms manifested by fatigue; device  pocket abscess; occlusion of the left subclavian vein.   PROCEDURES:  Contrast venogram, left ventricular lead placement, and  device explantation.   DESCRIPTION OF PROCEDURE:  Following obtaining informed consent, the  patient was brought to the electrophysiology laboratory and placed on  the fluoroscopic table in the supine position.  After routine prep and  drape, contrast venography demonstrated occlusion of the left subclavian  vein, but rapid reconstitution just in the intrathoracic cavity.  Lidocaine was then infiltrated along the line of the previous incision  and incision was made and carried down to the prepectoral fascia with  care to avoid the pocket.  We then spent a little bit of time and some  difficulty getting access to the left subclavian vein, but we were able  to do that.  There was no aspiration of air.  We able to pass a  micropuncture wire and then we gradually upsized using 5-French, 6-  [place], 7-French, 8-French, 9-French, and 10-French dilators and  subsequently, placed a 9.5-French sheath through which was passed  Medtronic MB2 coronary sinus cannulation catheter, which allowed for  ready cannulation of the coronary sinus.  However, there was a posterior  bend to the coronary sinus os and it was difficult to get the MB2  catheters to  seat.  We ended up using an Attain 1 Select dilator, which  allowed for deep seating, but unfortunately there was still torque and  the sheath almost prolapsed out.  We then used an Attain 2 system to  allow for deeper seating and we were then able to accomplish deep  seating of the vein.  Contrast venography demonstrated a paucity of  veins with the best prospect being high anterolateral branch.   We then spent the next hour and half or so trying to gain access to  this.  It was very difficult for a variety of reasons.  Dr. Tonny Bollman came in and helped and ultimately, what we were able to do was  using an Attain 2 90-degree sheath, able to cannulate this vein, and  place a 4196 88-cm lead into the left lateral branch, serial number  [id number].  In its final location, the parameters were really quite good  with a threshold of 2.7 V with an R-wave of 30, impedance of 1529 at the  current of 2.4 mA, and no diaphragmatic pacing left at 10 V.  With great  alacrity, we removed the insertion systems because of all the associated  torque and the lead placement was  stable.  We then secured this lead to  the prepectoral fascia, then proceeded to open the pocket.  It took some  time to open the pocket and it was noted that leads were placed on top  of the device.  The pocket was exceedingly difficult to open and when I  finally was able to open through the scar tissue, we were greeted by a  flow of pus.  Cultures were obtained and sent albeit with antecedent  antibiotics.  I then discussed the case with Dr. Ladona Ridgel over the phone  and it was appreciated that the device was going to need to be  explanted.  The system was going to need to be explanted and no new  device was appropriate.  We then further excavated the pocket and opened  the pocket, so we could remove the device.  The leads were not  interrogated.  They were placed into the pocket and loosely secured with  a 2-0 silk suture.  The  pocket was copiously irrigated with antibiotic-  containing saline solution.  A little bit of Surgicel was used and the  pocket was then closed relatively firmly with hopes that there would be  a sterile abscess, although I think at the end of the day it was not  going to matter and the device was going to come out anyway.   The patient tolerated the procedure without apparent complication.      Duke Salvia, MD, [facility]  Electronically Signed     SCK/MEDQ  D:  12/19/2008  T:  12/20/2008  Job:  [id number]   cc:   Colleen Can. Deborah Chalk, M.D.

## 2011-02-16 NOTE — Cardiovascular Report (Signed)
[name], [name]                ACCOUNT NO.:  [id number]   MEDICAL RECORD NO.:  [id number]           PATIENT TYPE:   LOCATION:                                 FACILITY:   PHYSICIAN:  Colleen Can. Deborah Chalk, M.D.DATE OF BIRTH:  17-Oct-1934   DATE OF PROCEDURE:  DATE OF DISCHARGE:                            CARDIAC CATHETERIZATION   HISTORY:  [name] has a known ischemic cardiomyopathy with previous  bypass grafting.  He had an ICD in place and reached end-of-life and  plans were to upgrade to a biventricular ICD.  In the ICD pocket,  infection was found.  Defibrillator lead in the right ventricle broke  during the extraction leaving an uncoiled wire extending into the RV  outflow tract.  He is referred for cardiac catheterization in large part  as a prelude to planned extraction of the uncoiled lead in the right  ventricle.   PROCEDURE:  Left heart catheterization with selective coronary  angiography, left ventricular angiography, and saphenous vein graft  angiography with angiography of the left internal mammary artery.   TYPE AND SITE OF ENTRY:  Percutaneous right femoral artery.   CATHETERS:  A 6-French 4-curved Judkins right and left coronary  catheter, 6-French pigtail ventriculographic catheter.   CONTRAST MATERIAL:  Omnipaque.   MEDICATIONS GIVEN PRIOR TO PROCEDURE:  Valium 10 mg p.o.   MEDICATIONS GIVEN DURING THE PROCEDURE:  Versed 2 mg IV.   COMMENT:  The patient tolerated the procedure well.   HEMODYNAMIC DATA:  The aortic pressure is 122/65, LV was 123/17-25.  There is no aortic valve gradient noted on pullback.   ANGIOGRAPHIC DATA:  1. The left main coronary artery is relatively long with      irregularities.  2. The left anterior descending was totally occluded proximally.  3. There is an intermediate with irregularities.  4. Right coronary artery was totally occluded proximally.  5. Saphenous vein graft to left anterior descending had 30% proximal  narrowing and adequate distal flow.  6. Saphenous vein graft to the acute marginal right coronary artery      was patent.  It was a large vessel.  7. Saphenous vein graft to obtuse marginal had a valve present, but      the saphenous vein graft were patent with satisfactory flow.  There      is 30% proximal narrowing in the saphenous vein graft.  8. Left internal mammary graft to the LAD is patent.  There is      satisfactory distal runoff present.   The left ventricular angiogram was then performed in the RAO position.  The left ventricle was enlarged.  There is anterior apical and inferior  apical akinesia.  The global ejection fraction estimated to be 20%.  No  mitral regurgitation could be appreciated.   The uncoiled ICD lead was clearly visible and extending into the right  ventricular outflow tract.   OVERALL IMPRESSION:  1. Severe left ventricular dysfunction.  2. There is severe three-vessel coronary artery disease.  3. Patent saphenous vein grafts x4 with patent left internal mammary  artery x1.   ADDENDUM:  Angio-Seal was performed.      Colleen Can. Deborah Chalk, M.D.  Electronically Signed     SNT/MEDQ  D:  12/25/2008  T:  12/26/2008  Job:  [id number]   cc:   Duke Salvia, MD, [facility]  Doylene Canning. Ladona Ridgel, MD

## 2011-02-16 NOTE — Assessment & Plan Note (Signed)
[facility]                         ELECTROPHYSIOLOGY OFFICE NOTE   STALEY, LUNZ                       MRN:          [id number]  DATE:07/02/2008                            DOB:          04-26-1935    [name] was seen in followup for ventricular tachycardia in the  setting of ischemic heart disease.  He underwent catheter ablation at  [facility] a couple of years ago and has had no recurrent ventricular  tachycardia.  His biggest issue now is related to his vision and  cataract surgery, which apparently did not go all that smoothly.   His medications include:  1. Cordarone 500 mg to take 5 days a week.  2. Coumadin.  3. Lipitor.  4. Atenolol 50.  5. Aspirin.  6. Finasteride.  7. Lexapro.   On examination, his blood pressure is 126/76 with a pulse of 98.  His  lungs are clear.  Heart sounds are regular.  The extremities had trace  edema.   Interrogation of Medtronic Franklin ICD demonstrates that he is  approaching ERI with a battery voltage of 2.64.  The P wave was 1.1 with  impedance of 440, threshold of 1 volt at 0.2.  That was also the RV  threshold.  The impedance was 628 and the amplitude was 11.  There was  no intercurrent therapies.  There was a beat of nonsustained VT.   IMPRESSION:  1. Ventricular tachycardia - history of storm status post catheter      ablation without clinical recurrence.  2. Status post implantable cardioverter-defibrillator for the above.  3. Ischemic heart disease.  4. Anxiety disorder.  5. Amiodarone therapy for ventricular tachycardia.   I have discussed with [name] and his wife further down titration  of his amiodarone.  Given his history and anxiety associated with this,  we will decrease it today from 500 mg a week to 300 mg a week to be  taken on Mondays, Wednesdays, and Fridays   We will plan to see him again in the [facility] in 3 months' time and  think over the next year about trying  to take him off it altogether.     Duke Salvia, MD, [facility]  Electronically Signed    SCK/MedQ  DD: 07/02/2008  DT: 07/03/2008  Job #: [id number]   cc:   Colleen Can. Deborah Chalk, M.D.

## 2011-02-16 NOTE — Op Note (Signed)
NAMESABIR, CHARTERS                ACCOUNT NO.:  [id number]   MEDICAL RECORD NO.:  [id number]          PATIENT TYPE:  INP   LOCATION:  2312                         FACILITY:  [facility]   PHYSICIAN:  Doylene Canning. Ladona Ridgel, MD    DATE OF BIRTH:  1935/09/03   DATE OF PROCEDURE:  12/26/2008  DATE OF DISCHARGE:                               OPERATIVE REPORT   PROCEDURE PERFORMED:  Implantable cardioverter defibrillator remnant  lead extraction via the right and left femoral vein.   INDICATIONS:  Retained defibrillator lead which had uncoiled and was  lodged in the pulmonary artery.   INTRODUCTION:  The patient is a 76 year old man who was subsequently  found at the time of BiV ICD upgrade to have a occult ICD pocket  infection.  He ultimately underwent removal of his ICD generator  followed by removal of his defibrillator lead system.  His LV and right  atrial leads and subcutaneous array were removed in total.  His  defibrillator lead broke between the proximal and distal coil.  Attempts  to initially snare the lead several days ago resulted in successful  snaring of the lead from the right femoral vein but unfortunately  resulted in recurrent breakage of the lead.  The lead was initially  abandoned.  Surgical consultation was requested but the patient was  deemed to be too high a risk for surgery, though there was ongoing  consideration made for a lateral thoracotomy approach with the patient  on bypass.  Because of the very extraordinary risk of a redo open chest  procedure with cardiopulmonary bypass, he is referred back today for  another attempt at removal of his residual defibrillator lead.   PROCEDURE IN DETAIL:  After informed consent was obtained, the patient  was taken to the diagnostic EP lab in a fasting state.  After usual  preparation, he was placed in the supine position.  An 8-French sheath  was inserted into the right jugular vein.  An 8-French hemostatic sheath  was inserted  into the left femoral vein.  A 16-French [employer]  was inserted in the right femoral vein.  The 7-French quadripolar  ablation catheter (B curved Cordis) was inserted by way of the left  femoral vein into the right atrium and then into the right ventricle.  The lead was encircled with the ablation catheter and a free portion was  pulled back into the inferior vena cava.  At this point, the goose-  necked 35-mm snare was advanced over the free portion of the  defibrillator lead into the right atrium and was withdrawn back until  the snare was attached to the free end of the defibrillator lead.  At  this point, the lead was successfully snared and retracted back into the  sheath.  At this point, the [employer] and sheath were  advanced as the snare was pulled back and there remained very dense  fibrous binding sites with the residual lead in the floor of the right  ventricle.  Applying gentle traction to the back into the lead and  gentle pressure on  the workstation, the lead coil began to unravel.  Over the next 5 minutes, gentle pressure and traction were applied to  the lead and uncoiling action began to develop, so that the  defibrillator coil was gradually uncoiled until a point where the coil  almost completely uncoiled, the lead pulled free, and was removed.  It  should be noted that the distal tip of the distal electrode remained  embedded in the ventricular myocardium but there appeared to be no  strands or other fibrous tissue attached to this distal electrode tip.  Further, it certainly appeared to be embedded in the myocardium.  The  patient's blood pressure was very carefully observed and during this  time, he remained hemodynamically stable.  The 16-French [facility] was removed and hemostasis was obtained after pressure was  applied to the right femoral vein.  During this time, the remnant of the  lead was inspected.  The 8-French sheath in the  left femoral vein along  with the ablation catheter were removed and the 8-French sheath in the  right jugular vein also removed and hemostasis was applied.  The patient  remained hemodynamically stable and he was returned to his room in  satisfactory condition.   COMPLICATIONS:  There were no immediate procedure complications.   RESULTS:  This demonstrates a successful removal of the patient's  residual defibrillator lead as previously described.      Doylene Canning. Ladona Ridgel, MD  Electronically Signed     GWT/MEDQ  D:  12/26/2008  T:  12/27/2008  Job:  [id number]   cc:   Colleen Can. Deborah Chalk, M.D.  Cliffton Asters, M.D.

## 2011-02-19 NOTE — Discharge Summary (Signed)
[place]. [facility]  Patient:    [name], [name]                       MRN: [id number] Adm. Date:  [id number] Disc. Date: [id number] Attending:  Eleanora Neighbor Dictator:   Jennet Maduro. Earl Gala, R.N., A.N.P. CC:         Nathen May, M.D., [facility]             Jonelle Sports. Cheryll Cockayne, M.D.                           Discharge Summary  PRIMARY DISCHARGE DIAGNOSES: 1. Chest pain with associated ventricular tachycardia requiring cardioversion    with subsequent positive cardiac enzymes with cardiac catheterization and    angioplasty to the left circumflex as well as left anterior descending. 2. Ventricular tachycardia with subsequent electrophysiology study and    subsequent automatic implanted cardioverter defibrillator implantation on    May 05, 2000.  SECONDARY DISCHARGE DIAGNOSES: 1. Atherosclerotic cardiovascular disease with a history of a large anterior    myocardial infarction sustained in 1984, with previous angioplasty to the    circumflex and right coronary with subsequent coronary artery bypass    grafting in May 2000, with left internal mammary to the second diagonal,    vein graft to the first diagonal, vein graft to the left anterior    descending, sequential vein graft to the right coronary and posterior    descending. 2. Left ventricular dysfunction, currently initiated on angiotensin-converting    enzyme inhibitor. 3. Non-insulin-dependent diabetes. 4. Hyperlipidemia, currently on Lopid and Lipitor. 5. Hypertension, currently controlled.  HISTORY OF PRESENT ILLNESS:  [name] is a 76 year old white male who has multiple medical problems.  He has had previous known coronary disease with subsequent coronary artery bypass grafting in May 2000.  He, basically, had been doing well over this past 12-14 months, with really no cardiac symptoms. He was in his usual state of health until the evening of admission when he and his wife had gone  out and eaten a steak sandwich at [employer] and, after returning home, he had the onset of tachycardia and palpitations and near syncope.  There was some associated substernal chest burning.  EMS was alerted, and he was found to be in a wide complex tachycardia.  He received IV adenosine without any effect and was subsequently cardioverted with 100 watts, which resulted in conversion back to normal sinus rhythm.  He was taken initially to [facility], where his first CK was negative, and he was subsequently transferred to [facility] for further evaluation.  Please see the dictated history and physical per Dr. Viann Fish for further patient presentation and profile.  ADMISSION LABORATORY DATA:  First cardiac enzyme was negative.  Second cardiac enzyme showed a CK total of 465 with an MB of 83.1.  This was the peak MB. Hematocrit was 34, white count 7.8, platelets 217.  Chemistries revealed sodium 138, potassium 3.7, chloride 106, CO2 27, BUN 14, creatinine 1.0, and glucose of 98.  Chest x-ray showed no evidence of acute disease.  Twelve-lead electrocardiogram showed sinus bradycardia with a first degree AV block with lateral T-wave changes.  HOSPITAL COURSE:  The patient was referred for admission from [facility] and was accepted initially to the service of Dr. Viann Fish.  He did rule in positive for myocardial infarction per serial enzymes.  It  was felt that it was best to proceed on with elective coronary angiography, and this procedure was performed on May 02, 2000.  The left ventricle demonstrated anterior apical hypokinesia.  Ejection fraction was 30%. Saphenous vein graft to the LAD was patent.  The native LAD had a 99% narrowing, and angioplasty was subsequently performed with a 2.5 mm balloon. The left internal mammary to the second diagonal was unremarkable.  The saphenous vein graft to the acute marginal/PDA was unremarkable.  Saphenous vein graft to the  intermediate was unremarkable.  The native left circumflex had a 90% narrowing, and angioplasty with a 2.5 mm cutting balloon was performed with an overall satisfactory result obtained.  However, in light of the patients presentation with ventricular ectopy, it was felt that electrophysiology study would be needed, and this was carried out the following day.  Subsequent EP study was performed to assess for risk as well as recurrence.  This procedure was performed on May 04, 2000, which showed easily inducible ventricular tachycardia, and AICD was recommended at that time.  On May 05, 2000, the patient underwent AICD implantation by a nonthoracotomy approach.  A Medtronic Gem III, serial number [id number], was implanted with overall satisfactory thresholds obtained.  Postprocedure, the patient was transferred back to 2000 for further monitoring and evaluation.  He has continued to do well throughout the remainder of his hospitalization.  He has had no recurrence of chest pain.  There have been no episodes of arrhythmia. Today, on May 06, 2000, he is doing well, with no complaints.  Rhythm has remained stable.  AICD site is stable.  Overall physical exam is unremarkable and, from a cardiac standpoint, he is felt to be stable for discharge today, with further evaluation on an outpatient basis.  CONDITION ON DISCHARGE:  Stable.  DISCHARGE MEDICATIONS: 1. Altace 2.5 mg daily. 2. Aspirin daily. 3. Glucotrol XL 5 mg daily. 4. Atenolol 50 mg daily. 5. Tagamet 400 mg daily. 6. Lipitor 10 mg daily. 7. Lopid 600 mg daily. 8. Nitroglycerin p.r.n.  ACTIVITY:  Light for the next one to two weeks.  He is not to raise the left arm above his head for the next one to two weeks.  As well, he is not to drive for the next six months.  DIET:  Low fat, diabetic.  WOUND CARE:  He is to paint the AICD site daily with Betadine swabs for the next three days.  DISCHARGE INSTRUCTIONS:  He is to  call if any problems arise.  FOLLOW-UP:  Will ask him to follow up with Dr. Graciela Husbands in approximately 7-10  days and be seen in our office in approximately 10-14 days, or sooner if problems arise. DD:  05/06/00 TD:  05/08/00 Job: [id number]

## 2011-02-19 NOTE — Consult Note (Signed)
[place]. [facility]  Patient:    [name], [name] Visit Number: [id number] MRN: [id number]          Service Type: MED Location: MICU 2102 01 Attending Physician:  Eleanora Neighbor Dictated by:   Chinita Pester, N.P. Proc. Date: 06/12/01 Admit Date:  06/10/2001   CC:         Colleen Can. Deborah Chalk, M.D.  Lucretia Kern, M.D., [facility]   Consultation Report  REASON FOR CONSULTATION:  VT storm.  HISTORY OF PRESENT ILLNESS:  This 76 year old gentleman with a past medical history of coronary artery disease, status post CABG, ventricular tachycardia, status post ICD who presented after multiple ICD discharges to [facility] and subsequently transferred to [facility].  He had greater than 25 shocks.  Positive cardiac enzymes on admission.  He states he was in his usual state of health with the exception of an upset "queasy" stomach before defibrillator fired.  He denies chest pain or shortness of breath. Recent Cardiolite in August 2002 showed an EF of 28% with apical akinesis.  No ischemia.  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  PAST SURGICAL HISTORY:  CABG in May 2000.  AICD August 2000, Medtronics device.  Low back surgery in 1999.  SOCIAL HISTORY:  Married.  Denies tobacco, alcohol, or recreational drugs.  FAMILY HISTORY:  Positive for CVA and CAD.  ALLERGIES:  No known drug allergies.  MEDICATIONS: 1. Glucotrol 2.5 daily. 2. Tenormin 25 daily. 3. Zocor 20 q.h.s. 4. Lopid 600 b.i.d. 5. Coumadin 5 q.h.s. 6. Amiodarone drip at 0.5 mg per minute.  REVIEW OF SYSTEMS:  HEENT:  No visual changes.  CARDIOVASCULAR:  No chest pain.  Positive chest wall tenderness.  RESPIRATORY:  No DOE, no shortness of breath.  GI:  Positive nausea.  No vomiting.  GU:   Negative dysuria. MUSCULOSKELETAL:  No joint pain.  LABORATORY DATA:  Sodium 139, potassium 3.9, chloride 108, CO2 25, BUN 13, creatinine 1.0, glucose 103.  H&H 12.6 and 35.4, white count 7.6, platelets 208.  Cardiac enzymes:  First set were CK 803, MB 62.1 with a troponin of 6.26.  #2:  CK 954, MB 25.3, troponin 4.51.  INR of 2.5.  Chest x-ray shows no active disease.  PHYSICAL EXAMINATION:  VITAL SIGNS:  130/70, 79, 20.  Telemetry shows normal sinus rhythm.  GENERAL:  Well-developed 76 year old lying in bed, anxious.  HEENT:  Sclera is clear.  NECK:  Supple, nontender, no bruit.  CARDIOVASCULAR:  Rate regular rhythm.  Positive S1, S2.  No murmur.  CHEST:  Clear to auscultation bilaterally.  ABDOMEN:  Soft, round, nontender.  Normoactive bowel sounds.  EXTREMITIES:  No edema.  NEUROLOGIC:  Awake, alert, and oriented x 3.  ASSESSMENT AND PLAN:  As per Dr. Lewayne Bunting: 1. Ischemic cardiomyopathy. 2. VT storm with episodes not terminated by implantable cardioverter    defibrillator shocks.  Cycle length 310 to 330 msec. 3. Elevated cardiac enzymes, questionable trigger.  RECOMMENDATIONS: 1. Agree with amiodarone therapy, could switch to p.o. today or tomorrow at    400 p.o. t.i.d. 2. Adjust antitachycardia pacing treatment.  He only had one attempt at    antitachycardia pacing before going to shock. 3. Left heart catheterization to exclude an acute ischemic trigger. 4. Electrophysiologic testing after amiodarone later this week.  If ______    still high, he will require ______.  Dictated by:   Chinita Pester, N.P. Attending Physician:  Eleanora Neighbor DD:  06/13/01 TD:  06/13/01 Job: [id number]

## 2011-02-19 NOTE — Op Note (Signed)
[place]. [facility]  Patient:    [name]                        MRN: [id number] Proc. Date: 05/04/00 Adm. Date:  [id number] Attending:  Eleanora Neighbor                           Operative Report  PREOPERATIVE DIAGNOSIS:  Ventricular tachycardia.  POSTOPERATIVE DIAGNOSIS:  Ventricular tachycardia.  OPERATION:  Invasive electrophysiological study.  SURGEON:  Nathen May, M.D. [facility]  DESCRIPTION OF PROCEDURE:  Cardiac catheterization was performed with local anesthesia and conscious sedation.  Noninvasive blood pressure monitoring and transcutaneous oxygen saturation monitoring, were performed continuously throughout the procedure.  Following insertion of the catheters and stimulation protocol included  1. Increment of atrial pacing 2. Increment of ventricular pacing. 3. Single atrial extrastimuli to pace cycle length of 600 milliseconds. 4. Single ventricular extrastimuli from the right ventricular apex at a pace    cycle length of 600 and 400 milliseconds. 5. Single and double ventricular extrastimuli from the right ventricular apex    at a pace cycle length of 400 and 600 milliseconds.  Surface electrocardiogram. Rhythm:  Rhythm is sinus. Cycle length:  1012 msec. P-R interval: 225 msec. QRS duration: 108 msec. Q-T interval:  462 msec.  Final is 376 msec. P wave duration:  143 msec. Bundle branch block:  Present, Nonspecific IVCD. Preexcitation:  Absent.  AV nodal function: A-H interval:  89 msec. AV Wenckebach:  Was 315 msec. VA Wenckebach:  600 msec.  AV nodal effector refractor:  Had a pace cycle length of 600 msec and was less than or equal to 325 msec representing a functional refractory period of the atrium.  Dual AV nodal physiology was present based on P-R intervals longer than R-R intervals.  His-Purkinje system function: HV interval:  57 msec. His bundle duration:  14 msec.  Accessory pathway function:   No evidence of a accessory pathway was identified.  Ventricular response to programmed stimulation. The effective refractory period at the right ventricular apex had a pace cycle length of 600 msec.  It was 280 msec and 400 msec it was 240 msec.  Arrhythmias induced:  Sustained monomorphic ventricular tachycardia with a left bundle-branch block, left access deviation tachycardia with a cycle length of 268 milliseconds was induced with double ventricular extrastimuli. This is a different ventricular tachycardia from the patients clinical ventricular tachycardia.  It was not paced terminal and the patient was then cardioverted with 200 joules delivered synchronously via ______ defibrillator.  IMPRESSION: 1. Normal sinus function apart from sinus bradycardia. 2. First degree AV block. 3. Prolonged atrioventricular conduction. 4. Evidence of dual AV nodal physiology without inducible echocardiogram    beats. 5. No accessory pathway. 6. Normal HV intervals with a nonspecific interventricular conduction delay. 7. Inducible sustained monomorphic ventricular tachycardia requiring    cardioversion.  SUMMARY:  In conclusion, the results of the electrophysiological testing identified a persistent substrate for ventricular tachycardia albeit with a distinct morphology from his previous VT.  This was not amenable to paced termination and required cardioversion.  RECOMMENDATIONS:  Therapeutic options would include amiodarone therapy which would certainly require backup bradycardia pacing given his resting bradycardia versus primary therapy with implantable defibrillator.  I will review these options with Dr. Deborah Chalk. DD:  05/04/00 TD:  05/04/00 Job: [id number]

## 2011-02-19 NOTE — Letter (Signed)
July 03, 2007    Colleen Can. Deborah Chalk, M.D.  [address]  [place], [place] [address]   RE:  AEMON, KOELLER  MRN:  [id number]  /  DOB:  11-May-1935   Dear Weyman Croon:   Mr. Joost came in today.  He is doing okay.  His rhythm is stable.  He is able to walk without shortness of breath, and he is still working  pretty hard out in the yard.   His medications include:  1. Cordarone 100 mg five days a week.  2. Coumadin.  3. Lipitor.  4. Atenolol 50.  5. Aspirin.  6. Finasteride.  7. Lexapro.   PHYSICAL EXAMINATION:  VITAL SIGNS:  Blood pressure 118/70.  His pulse  was 60.  LUNGS:  Clear.  HEART:  Sounds were regular.  EXTREMITIES:  Were without edema.  SKIN:  Warm and dry.   Interrogation of his Medtronic Marquis [phone] ICD demonstrates a P wave of  1 with impedance of 440, threshold of 1 volt at 0.2.  The R wave was 8.8  with impedance of 654, threshold of 1 volt at 0.2.  High voltage  impedances were 38/45.  Battery voltage was 2.86.   IMPRESSION:  1. Ventricular tachycardia, status post implantable cardioverter-      defibrillator implantation with device revision.  2. Ventricular tachycardia storm despite that status post successful      ablation at [facility].  3. Ischemic heart disease with depressed left ventricular function.  4. High grade conduction system disease with 100% ventricular pacing.  5. Anxiety disorder.   Mr. Wagley is doing pretty well at this time.  He had contacted [place] to  see if we could re-establish device followup here.  Weyman Croon, we told him we  would be glad to do that as long as that was okay with you and we  tentatively set him up for a 57-month repeat visit.    Sincerely,      Duke Salvia, MD, [facility]  Electronically Signed    SCK/MedQ  DD: 06/30/2007  DT: 06/30/2007  Job #: [id number]

## 2011-02-19 NOTE — H&P (Signed)
[place]. [facility]  Patient:    [name], [name] Visit Number: [id number] MRN: [id number]          Service Type: MED Location: MICU 2102 01 Attending Physician:  Eleanora Neighbor Dictated by:   Colleen Can. Deborah Chalk, M.D. Admit Date:  06/10/2001   CC:         Dr. Blanchard Mane   History and Physical  HISTORY:  The patient is admitted for evaluation of multiple ICD discharges (approximately ten).  HISTORY OF PRESENT ILLNESS:  The patient has ICD implanted in August 2000 secondary to ventricular tachycardia by Dr. Berton Mount. He had a history of coronary artery bypass grafting by Dr. Donata Clay in May 2000 with saphenous vein graft to the LAD, saphenous vein graft to the first diagonal, saphenous vein graft to the right coronary artery posterior descending and a LIMA to the second diagonal. Previously he had angina with myocardial infarction in 1984, history of angioplasty to the right coronary artery and left circumflex in 1992. He has had an ICD placed with Medtronics 7275 in the DVR mode. Care Link monitoring him when he was being transferred from [facility] showed runs of ventricular tachycardia. His EKG shows chronic left bundle branch block.  Pertinent labs from [facility] showed potassium level 4.0, BUN and creatinine normal. Glucose 239. CK 237 with MB 38.4. No clear cut precipitating event that triggered these discharges from the patients standpoint. He does have a history of longstanding hypertension dating back to his teenage years. History of hypercholesterolemia, gastroesophageal reflux, diabetes mellitus, ACE intolerance, history of left bundle branch block. He recently was noted to have an apical mural thrombus and was started on Coumadin anticoagulation. He had Adenosine Cardiolite study, ejection fraction 28%, apical akinesis, but no ischemia. This was done on May 15, 2001.  CURRENT MEDICATIONS: 1. Glucotrol XL 5 mg taking 2.5 mg a  day. 2. Atenolol 50 mg a day. 3. Lipitor 10 mg a day. 4. Lopid 600 mg b.i.d. 5. Coumadin 5 mg a day.  PAST SURGICAL HISTORY:  He has had low back surgery in 1999.  ALLERGIES:  NO KNOWN DRUG ALLERGIES.  FAMILY HISTORY:  Father died at age 56 of CVA. Mother died at age 59 of a history of heart disease. Three sisters and two brothers. Family history is positive for heart disease.  SOCIAL HISTORY:  He has been married on three different occasions. He was last married for 15 years. Nonsmoker. No current ethanol use. He is on disability.  REVIEW OF SYSTEMS:  Essentially negative. Somewhat limited in activity because of cardiac condition. No chest pain, otherwise has been negative. There has been some mild chest tenderness because of the ICD discharges.  PHYSICAL EXAMINATION:  GENERAL:  Pleasant and in no acute distress. Alert.  NECK:  Supple. No lymphadenopathy.  HEENT:  Unremarkable. Oropharynx clear.  VITAL SIGNS:  Blood pressure 142/84, heart rate 75.  CHEST:  ICD in place. No real frank tenderness.  LUNGS:  Clear.  HEART:  Regular rate and rhythm. No S3, no murmur.  ABDOMEN:  Normal. Normal bowel sounds. No masses, no hepatosplenomegaly.  NEUROLOGICAL:  Intact.  EKG shows sinus with left bundle branch block.  OVERALL IMPRESSION: 1. Multiple ICD discharges, probably related to ventricular tachycardia. 2. Atherosclerotic cardiovascular disease. 3. Congestive heart failure. 4. Adult onset diabetes mellitus. 5. Hypercholesterolemia. 6. History of hypertension.  PLAN:  Will start him on IV Amiodarone. Plan ICD interrogation. He will be loaded with Amiodarone, at this  point in time. Dictated by:   Colleen Can Deborah Chalk, M.D. Attending Physician:  Eleanora Neighbor DD:  06/11/01 TD:  06/11/01 Job: [id number]

## 2011-02-19 NOTE — Op Note (Signed)
[facility]  Patient:    [name], [name] Visit Number: [id number] MRN: [id number]          Service Type: MED Location: 2000 2010 01 Attending Physician:  Eleanora Neighbor Proc. Date: 06/26/01 Admit Date:  06/10/2001                             Operative Report  NO DICTATION. Attending Physician:  Eleanora Neighbor DD:  06/26/01 TD:  06/26/01 Job: [id number]

## 2011-02-19 NOTE — H&P (Signed)
[place]. [facility]  Patient:    [name]                        MRN: [id number] Adm. Date:  [id number] Attending:  Eleanora Neighbor CC:         Colleen Can. Deborah Chalk, M.D.             Rodrigo Ran, M.D.                         History and Physical  REASON FOR ADMISSION:  Ventricular tachycardia.  HISTORY OF PRESENT ILLNESS:  The patient is a 76 year old male who has a longstanding history of coronary artery disease.  He had an anterior MI in 1984.  He had previous angioplasty to the circumflex and right coronary artery in 1992 and had progressive angina and three vessel disease noted in May of 2000.  On Feb 12, 1999, he had coronary artery bypass grafting by Dr. Kathlee Nations Trigt III with internal mammary graft to the second diagonal with vein graft to the first diagonal, a vein graft to the LAD, a sequential vein graft to the right coronary artery and posterior descending artery.  At operative findings, the anteroapical wall was scar and thin.  The circumflex was not felt to be large enough for grafting.  The patient had a fairly unremarkable course following his coronary artery bypass grafting and has not had any recurrent cardiac symptoms.  He was in his usual state of health until the evening of admission when he and his wife had gone out and eaten a steak sandwich at [employer].  After returning home, he had the onset of tachypalpitations and near syncope in association with his burning substernal pain.  He may have had some indigestion on the way home.  EMS was called and he was found to be in a wide complex tachycardia.  He received Adenosine without effect and then was cardioverted with 100 watts when he had near syncope and resulted in conversion to sinus rhythm.  He was seen at the [facility] and had negative CPK and a minimally elevated troponin and was transferred here by ambulance for further evaluation.  The patient is  asymptomatic and feels well.  He has not had a prior history of arrhythmia or tachypalpitations and has not had any recurrent angina or shortness of breath.  He has not had any recent alcohol abuse, excess caffeine use, or smoking.  PAST MEDICAL HISTORY:  Remarkable for diabetes mellitus, hypertension, dyslipidemia, previous history of osteoarthritis of the low spine, and a previous history of dyslipidemia.  PAST SURGICAL HISTORY:  Lower back surgery in 1999, coronary artery bypass grafting in 2000, tonsillectomy.  ALLERGIES:  None.  CURRENT MEDICATIONS: 1. Glucotrol XL 5 mg q.d. 2. Lopid 600 mg q.d. 3. Atenolol 50 mg q.d. 4. Cimetidine 400 mg q.d. 5. Aspirin 325 mg q.d. 6. Lipitor 10 mg q.d.  FAMILY HISTORY:  His family history is recorded in old records, reviewed, and is unchanged.  SOCIAL HISTORY:  He has been married three times.  He has four children with previously adopted children.  He is currently married to his wife for the past 15 years.  He is currently a nonsmoker.  He may have drank some in the past but none recently.  He is currently on disability and formerly worked as a Public affairs consultant.  REVIEW OF SYSTEMS:  He has had  a history of some rectal pain for which he has seen Dr. Angelia Mould. Derrell Lolling.  His dyslipidemia has been adequately controlled. He does not have any claudication and does not have any edema.  He does have history of gastroesophageal reflux disease.  He is thought to have some hemorrhoids previously.  He has diabetes and says his sugar is under adequate control.  The remainder of the review of systems is unremarkable except as noted above.  PHYSICAL EXAMINATION:  GENERAL:  He is a pleasant male who appears his stated age and is in no acute distress.  VITAL SIGNS:  Blood pressure is 110/80, pulse is currently 65 with occasional PVCs noted.  SKIN:  Warm and dry.  HEENT:  Shows him to wear glasses.  PERRLA.  CNS clear.  Fundi were not examined.  Pharynx  was negative.  NECK:  Supple without masses.  No thyromegaly or carotid bruits.  JVD is flat.  LUNGS:  Clear to auscultation and percussion.  CARDIOVASCULAR:  Normal S1 and S2.  There was no S3, S4, or murmur.  LYMPH NODES:  Unremarkable.  ABDOMEN:  Obese, soft, and nontender.  No masses or organomegaly.  No aneurysm noted.  PULSES:  Femoral pulses are 3+ bilaterally without bruits.  Peripheral pulses were 3+ bilaterally.  EXTREMITIES:  There are mild venous varicosities noted.  There was no significant edema present.  NEUROLOGICAL:  Grossly normal.  LABORATORY DATA:  A 12-lead ECG done at [facility] shows ST depression in I, aVL, standard lead II, old anterior septal infarction, and occasional PVCs.  Chest x-ray shows cardiomegaly, clear lung fields, previous coronary artery bypass graft surgery.  Laboratory data reviewed from [facility] showed a normal PT and PTT. Hemoglobin is 12, hematocrit is 36.  CPK was 92 with an MB of 3.7.  Glucose is 258, BUN 17, creatinine 1.3, potassium 1.1.  Review of strips show a wide complex tachycardia with an upright QRS in standard lead V6 as well as V1.  QRS width is 0.16, axis was rightward.  IMPRESSION: 1. Wide complex tachycardia likely ventricular tachycardia that was    cardioverted, no response to Adenosine. 2. Coronary artery disease.    a. Previous anterior myocardial infarction.    b. Previous coronary artery bypass grafting x 5 in May 2000.    c. Ungrafted circumflex noted at that time. 3. Noninsulin-dependent diabetes mellitus. 4. Hypertension under treatment. 5. Dyslipidemia under treatment. 6. Gastroesophageal reflux disease. 7. Osteoarthritis of the spine with previous back surgery. 8. Obesity.  RECOMMENDATIONS:  He is begun on heparin, beta blockers, aspirin, and will have a MI ruled out.  With his ventricular tachycardia, he will be evaluated for any recurrence.  He will need to have a cardiac  catheterization to assess  his graft patency which will be done by Dr. Colleen Can. Deborah Chalk and he will also need an electrophysiology consultation for evaluation of the wide complex tachycardia and recommendations for further therapy.DD:  05/02/00 TD:  05/02/00 Job: [id number]

## 2011-02-19 NOTE — Cardiovascular Report (Signed)
[place]. [facility]  Patient:    [name], [name] Visit Number: [id number] MRN: [id number]          Service Type: MED Location: MICU 2102 01 Attending Physician:  Eleanora Neighbor Dictated by:   [name], M.D. Proc. Date: 06/14/01 Admit Date:  06/10/2001   CC:         [name], M.D.   Cardiac Catheterization  PROCEDURE:  Left heart catheterization with selective coronary angiography, left ventricular angiography, saphenous vein graft angiography x 3, and angiography of left internal mammary artery.  CARDIOLOGIST:  [name], M.D.  TYPE AND SITE OF ENTRY:  Percutaneous right femoral artery with Perclose.  CATHETERS:  6-French 4 curved Judkins right and left coronary catheters, 6-French pigtail ventricular guiding catheter.  CONTRAST:  Omnipaque.  MEDICATIONS GIVEN DURING PROCEDURE:  Versed 2 mg IV, Ancef 1 g IV.  MEDICATIONS GIVEN PRIOR TO PROCEDURE:  Valium 10 mg p.o.  HEMODYNAMIC DATA:  The LV pressure was 144/25, aortic pressure 144/80.  There was no aortic valve gradient noted on pullback.  ANGIOGRAPHIC DATA: 1. Left main: The left main coronary artery had mild irregularities, no    significant obstruction. 2. Left circumflex: The left circumflex was relatively small.  It had    irregularities but no significant obstructive disease. 3. Intermediate: The intermediate is totally occluded proximally. 4. Left anterior descending artery: The left anterior descending is totally    occluded proximally. 5. Right coronary artery: The right coronary artery is totally occluded    proximally. 6. Saphenous vein graft to acute marginal and distal right coronary artery is    patent with satisfactory flow. 7. Saphenous vein graft to the left anterior descending is patent.  There    is slow flow which is felt to probably be secondary to infarcted area in    this distribution. 8. Saphenous vein graft to intermediate has a valve  in place in the body of    the graft but overall is patent with satisfactory flow and distal runoff. 9. Left internal mammary artery to the diagonal is patent with excellent    flow and distal runoff.  LEFT VENTRICULAR ANGIOGRAM:  The left ventricular angiogram was performed in the RAO position.  Overall cardiac size is enlarged and somewhat elongated. The global left ventricular ejection fraction is approximately 25%.  There is distal anteroapical and inferoapical akinesis with mild dyskinesis as well. The more proximal portions of the septum contracted reasonably well.  There was no evidence of mural thrombus present.  There was no mitral regurgitation.  OVERALL IMPRESSION: 1. Severe left ventricular dysfunction. 2. Totally occluded left anterior descending, intermediate, and right coronary    artery. 3. Patent grafts x 5.  DISCUSSION:  It is felt that [name] current arrhythmic events are not ischemic in nature.  He does not really have significant areas that I would consider to need revascularization.Dictated by:   [name] Can Deborah Cook, M.D.  Attending Physician:  Eleanora Neighbor DD:  06/14/01 TD:  06/14/01 Job: [id number]

## 2011-02-19 NOTE — Discharge Summary (Signed)
[place]. [facility]  Patient:    [name], [name] Visit Number: [id number] MRN: [id number]          Service Type: MED Location: [facility] 2922 01 Attending Physician:  Eleanora Neighbor Dictated by:   Jennet Maduro Earl Gala, A.N.P.-C. Admit Date:  06/10/2001 Disc. Date: 06/27/01   CC:         Nathen May, M.D. [facility]  Doylene Canning. Ladona Ridgel, M.D. [facility]  Rodrigo Ran, M.D.   Discharge Summary  PRIMARY DISCHARGE DIAGNOSIS:  1. Ventricular tachycardia storm, with multiple implantable     cardioverter defibrillator discharges, with subsequent revision     and upgrading of the automatic implantable cardioverter defibrillator,     with recurrent ventricular tachycardia.  SECONDARY DISCHARGE DIAGNOSES:  1. Atherosclerotic cardiovascular disease with previous coronary artery     bypass grafting in May 2000, with the saphenous vein graft to the left     anterior descending coronary artery, saphenous vein grafting to the first     diagonal, saphenous vein graft to the right coronary artery, posterior     descending, and left internal mammary to the second diagonal.  He had     a repeat cardiac catheterization on June 14, 2001, showing severe     left ventricular dysfunction with a totally-occluded left anterior     descending coronary artery, intermediate, and right coronary arteries.     The grafts are patent x 5.  2. Significant left ventricular dysfunction.  3. Recently-noted apical mural thrombus, on Coumadin anticoagulation in     early August 2002, with most recent 2-D echocardiogram showing no     evidence of mural thrombus.  4. Hypertension.  5. Hypercholesterolemia.  6. Gastroesophageal reflux disease.  7. Non-insulin-dependent diabetes mellitus.  8. History of angiotensin converting enzyme intolerance.  9. Chronic left bundle branch block. 10. Anxiety.  HISTORY OF PRESENT ILLNESS:  [name] is a very pleasant 76 year old white male who has  multiple medical problems.  He presented for his routine care at the early part of August.  At that time we proceeded on with a 2-D echocardiogram as well as an adenosine Cardiolite study as part of his follow-up visit.  The 2-D echocardiogram did reveal an apical mural thrombus. His Cardiolite showed no evidence of ischemia, with an ejection fraction of approximately 28%, however.  He was subsequently started on Coumadin and initially did quite well.  He presented to [facility] per EMS on June 10, 2001, after having multiple ICD discharges.  In transport from [facility] to [facility] per [facility], he had multiple runs of ventricular tachycardia.  He was subsequently started on IV amiodarone and transferred here to the intensive care unit for further monitoring and evaluation.  LABORATORY DATA:  On admission his potassium was low at 3.3, sodium 140, chloride 109, CO2 of 25, BUN 13, creatinine 1.0, glucose 139.  Hematocrit was stable at 35, with a white count of 7000, platelets 208. INR was therapeutic at 3.3, CPK-MB 803 total with an MB fraction of 62.1.  We continued to load the patient initially with IV amiodarone.  An EP consultation was carried out and his potassium was replaced accordingly.  It was felt that we would need to rule out ischemia as the origin, and subsequently proceeded on with a coronary angiography on June 14, 2001; however, on June 13, 2001, at approximately 7:30 a.m. a code blue was initiated.  The patient had recurrent ventricular tachycardia.  He was already maintained on his amiodarone drip.  Extra boluses were given.  He had multiple discharges which would only transiently restore normal sinus rhythm, only for him to return to ventricular tachycardia each time.  He remained awake throughout all of these episodes.  He has subsequently been sedated, and tried to have a resolution of his arrhythmias through the  defibrillator, but has required external defibrillation attempts as well.  He was subsequently maintained on IV amiodarone, lidocaine, as well as Pronestyl.  We proceeded on with a coronary angiography on June 14, 2001.  Those results are as noted above.  It is felt that ischemia is not the trigger for this arrhythmia.  We continued medical management from that point on with IV amiodarone, Pronestyl, as well as lidocaine.  Anti-anxiety agents were initiated as well.  [name] had recurrent episodes of ventriclar tachycardia on the afternoon of June 15, 2001.  He had to be rebolused with IV amiodarone and was cardioverted x 3.  Subsequently a magnet has been placed over the defibrillator, and from then on plans were for the patient to be sedated per anesthesia prior to any electrical cardioversions.  He has never lost consciousness with any of these episodes.  By June 16, 2001, the patient continued to remain in the intensive care unit.  At this point he was stable without recurrence of ventricular tachycardia.  He continued to be maintained on Pronestyl and amiodarone, as well as IV lidocaine.  IV heparin had been initiated for anticoagulation, as there was still some concern as to possible mural thrombus.  A transthoracic 2-D echocardiogram was obtained which showed a fibrotic apex with some shadowing, but there was no thrombus.  The cardiac catheterization films were reviewed, the LV-gram particularly.  This picture showed a thin and smooth apex, with no thrombus noted.  At that point in time it was felt that we would need to proceed on with ablation if needed; however, throughout the remainder of the week of June 15, 2001, he remained stable, without any recurrence of arrhythmia or ICD discharges.  We then elected to proceed on with ICD revision in the operating room on June 26, 2001, as well as the placement  of a new sensing lead to be placed laterally  under the skin per Dr. Kathlee Nations Trigt III and Dr. Nathen May in collaboration.  The weekend of June 23, 2001, he remained stable.  His rhythm was holding on the current medicines which had subsequently been switched over to amiodarone and Mexitil. In conjunction with Dr. Graciela Husbands, we felt that it was best to proceed on at this point in time with the subcutaneous patch placement and upgrade of the ICD, and that if [name] arrhythmia would return, at that point we would consider ablation.  On June 26, 2001, he underwent an ICD revision in the operating room with placement of a new sending lead.  The overall procedure was tolerated well. Shortly after his arrival to the post anesthesia care unit he had recurrent ventricular tachycardia, with a total of six ICD discharges that failed to convert.  There was the appearance that he did try to have tachycardia pacing performed, which was also unsuccessful.  At that point in time his medicines were switched over to quinidine, and he was continued on oral amiodarone.  Today on June 27, 2001, he has had recurrent ventricular tachycardia. The ICD is currently programmed off.  Plans will now be made for the patient to  be transferred to [facility] for probable ventricular tachycardia ablation.  At the present time he is maintained back on IV amiodarone.  His oral antiarrhythmics have been held.  His overall cardiac status remains stable.  He is currently sedated due to previous sedatives that have been given.  DISPOSITION:  The plans are now for him to be transferred to [facility] for further evaluation and care.  CONDITION ON TRANSFER:  Critical. Dictated by:   Jennet Maduro. Earl Gala, A.N.P.-C. Attending Physician:  Eleanora Neighbor DD:  06/27/01 TD:  06/27/01 Job: [id number]

## 2011-02-19 NOTE — Discharge Summary (Signed)
NAMEDEMETRIES, [name]                          ACCOUNT NO.:  [id number]   MEDICAL RECORD NO.:  [id number]                   PATIENT TYPE:  OIB   LOCATION:  4733                                 FACILITY:  [facility]   PHYSICIAN:  Doylene Canning. Ladona Ridgel, M.D. [facility]           DATE OF BIRTH:  01-23-1935   DATE OF ADMISSION:  05/30/2002  DATE OF DISCHARGE:  05/31/2002                                 DISCHARGE SUMMARY   DISCHARGE DIAGNOSES:  1. Ventricular tachycardia.  2. Status post generator change out for implantable cardioverter     defibrillator; status post device modification with subcutaneous array     secondary to apical ventricular tachycardia [facility]).  3. Ischemic heart disease.  4. Left ventricular dysfunction.  5. Anxiety.   HOSPITAL COURSE:  The patient is a 76 year old male with a history of  ventricular tachycardia as well as VT storm.  In addition, he has known  ischemic cardiac disease with post bypass surgery.  He was seen in the  [facility] offices on May 25, 2002, by Duke Salvia, M.D.  After  interrogation of his defibrillator, Dr. Graciela Husbands decided that the patient's  device had reached ERI.  As a result, he arranged for outpatient replacement  of the device.   On May 30, 2002, the patient was taken to electrophysiology lab by Dr.  Sherryl Manges.  His ICD generator was  exchanged for a Medtronic Butler DR  unit.  The patient was noted to tolerate the procedure well.   The next day the patient was seen by Dr. Doylene Canning. Ladona Ridgel.  He felt the  patient was doing well and was ready for discharge.  It should be noted,  that the patient reports some recent productive cough and started him on  Tequin for presumed bronchitis.   DISCHARGE MEDICATIONS:  1. Amiodarone 100 mg (one half 200 mg) q.d.  2. Tequin 400 mg q.d. x10 days.  3. Lisinopril 10 mg q.d.  4. Atenolol 50 mg q.d.  5. Lipitor 10 mg q.h.s.  6. Coumadin 2.5 mg q.p.m.  7. Enteric coated aspirin 81 mg  q.d.  8. Tagamet as previously taken.   DISCHARGE INSTRUCTIONS:  1. The patient was advised to gradual increase activity using his left arm     until it could be raised over his head in approximately one week.  He is     advised to avoid lifting over 10 pounds until seen in the office.  2. He is to follow a low fat diet.  3. He is advised to keep the site dry.  4. He is to follow up with Chinita Pester, C.R.N.P., in the pacemaker clinic on     June 19, 2002, at 2 p.m.  5.     He is to follow up with Dr. Deborah Chalk on July 02, 2002, as scheduled;     with change in amiodarone dosing as well  as addition of Tequin, he needs     close follow-up on his INR.  6. He is to follow up with Dr. Waynard Edwards as needed or as scheduled.     Annett Fabian, P.A. [facility]                  Doylene Canning. Ladona Ridgel, M.D. [facility]    CKM/MEDQ  D:  05/31/2002  T:  05/31/2002  Job:  [id number]   cc:   Loraine Leriche A. Perini, M.D.   Colleen Can. Deborah Chalk, M.D.  [address]  [place]  [place] [address]  Fax: [phone]   Duke Salvia, M.D. [facility]

## 2011-02-19 NOTE — Cardiovascular Report (Signed)
[place]. [facility]  Patient:    MARKHI, KLECKNER                      MRN: [id number] Proc. Date: 05/02/00 Attending:  Colleen Can. Deborah Chalk, M.D. CC:         Jonelle Sports. Cheryll Cockayne, M.D.                        Cardiac Catheterization  PROCEDURE:  Left heart catheterization with selective coronary angiography, left ventricular angiography and bypass graft angiography x 3 with angiography internal mammary artery and subsequent angioplasty of the left anterior descending coronary artery (native coronary through the saphenous vein graft; and angioplasty of the native left circumflex with a 2.5 x 10 mm Cutting Balloon.  TYPE AND SITE OF ENTRY:  Percutaneous right femoral artery with Perclose.  CATHETERS:  A 6 French 4 curved Judkins right and left coronary catheters, 6 French pigtail ventriculographic catheter, 7 French 3.5 Voda guide, 7 [place] JR4 right coronary guide, Hi-Torque Floppy guide wire, a 2.5 x 15 mm Maverick balloon and a 2.5 x 10 mm Cutting Balloon.  MEDICATIONS GIVEN PRIOR TO THE PROCEDURE:  Valium 10 mg p.o.  MEDICATIONS GIVEN DURING THE PROCEDURE:  Heparin 4000 units IV, Integrilin, and IV nitroglycerin.  COMMENTS:  The patient tolerated the procedure well.  HEMODYNAMIC DATA:  The aortic pressure was 127/65,  LV was 126/21. There was no aortic valve gradient noted on pullback.  ANGIOGRAPHIC DATA: 1. Left main coronary artery:  Left main coronary artery is normal. 2. Left circumflex:  The left circumflex is a 2.5 mm vessel on the back side    of the heart.  There is a 90% focal stenosis proximally. 3. Left anterior descending:  The left anterior descending is totally    occluded proximally. 4. Right coronary artery:  The right coronary artery has segmental 70%    then 90% stenosis before a moderate sized acute marginal vessel which    has a 90% stenosis proximally.  This vessel fills antegrade and is not    bypassed.  There is some retrograde  fill from the bypass right coronary    artery. 5. Saphenous vein graft to the right coronary artery:  The saphenous vein    graft to the right coronary artery attaches to the acute margin and to    the posterior descending vessels.  It has nice insertion with good distal    runoff.  There is some retrograde filling of this proximal acute    marginal vessel and some collateral filling to the septal perforating    branches. 6. Saphenous vein graft to the left anterior descending had somewhat slow    flow and the insertion site is satisfactory but after the insertion there    is a 99% stenosis in the native vessel.  There is further 70% narrowings    distally in the left anterior descending.  This left anterior descending    does not go to considerable viable myocardium from his previous infarction. 7. Saphenous vein graft to the intermediate has some irregularities but    satisfactory insertion and distal runoff. 8. Left internal mammary graft to the second diagonal vessel has satisfactory    insertion and distal runoff.  LEFT VENTRICULAR ANGIOGRAM:  The left ventricular angiogram was performed in the RAO projection.  The overall cardiac size is enlarged.  There anterior apical akinesis.  The global ejection fraction is  probably 30%.  There is 1+ mitral regurgitation that could be arrhythmia induced.  The inferior wall and anterior basilar portions contract reasonably well.  ANGIOPLASTY PROCEDURE:  We changed catheter to a 7 Jamaica system using a JR4 guide and a Hi-Torque Floppy guide wire were able to pass the wire through the saphenous vein graft into the distal left anterior descending.  The 2.5 mm balloon was inflated to a maximum of 11 atmospheres for a maximum of 90 seconds.  There was a very satisfactory angioplasty result with a 99% stenosis in the left anterior descending being reduced to less than 30%.  We then turned our attention to the left circumflex.  Using a 3.5 Voda  guide the same Hi-Torque Floppy guide wire was passed into the left circumflex. With some difficulty, a 2.5 x 10 mm Cutting Balloon was maneuvered across the lesion.  It was inflated in two different locations for a total of four times in the artery with two dilatations in both locations.  The 90% stenosis in the left circumflex was reduced to less than 20-30% and was felt to be a very satisfactory angioplasty result.  OVERALL IMPRESSION: 1. Severe stenosis in the left circumflex and native left anterior descending    with successful angioplasty of both lesions. 2. Persistent patency of all five coronary artery bypass grafts. 3. Totally occluded right coronary artery and left anterior descending with    a severe stenosis in left circumflex as noted above. 4. Left ventricular dysfunction with anteroapical akinesis.  DISCUSSION:  Because of the patients presentation with ventricular tachycardia, and some confusion about the actual enzyme rise and degree of myocardial infarction that was either before arrhythmia or after arrhythmia, the patient will be referred for electrophysiology study and consultation. DD:  05/02/00 TD:  05/03/00 Job: [id number]

## 2011-02-19 NOTE — Op Note (Signed)
[place]. [facility]  Patient:    [name], [name] Visit Number: [id number] MRN: [id number]          Service Type: MED Location: 2000 2010 01 Attending Physician:  Eleanora Neighbor Proc. Date: 06/26/01 Admit Date:  06/10/2001   CC:         CVTS Office  [place] electrophysiology office, Dr. Graciela Husbands   Operative Report  PROCEDURE:  Revision of AICD leads.  SURGEON:  Mikey Bussing, M.D.  ANESTHESIA:  General anesthesia.  PREOPERATIVE DIAGNOSIS:  Failure of AICD system to adequately sense slow ventricular tachycardia.  POSTOPERATIVE DIAGNOSIS:  Failure of AICD system to adequately sense slow ventricular tachycardia.  INDICATIONS:  The patient is a 76 year old male who had undergone previous four vessel coronary artery bypass grafting and AICD placement several years previously.  He had a preoperative anterior apical MI and had perioperative ventricular tachycardia.  He subsequently was admitted at this time with a ventricular tachycardia Storm and with indicated by EP testing that the AICD was not sensing a slow ventricular tachycardia.  His electrophysiologist, Dr. Graciela Husbands, recommended revision of the AICD system by the additional placement of a subcutaneous array of sensing electrodes over the left ventricular apex.  I discussed the indications and rationale of this procedure to the patient and family.  I discussed the surgical incisions that would be required, the use of general anesthesia, and the expected postoperative recovery period.  I reviewed the alternatives to surgical therapy for his ventricular arrhythmias and the consequences of not changing the current AICD system.  He understood these aspects of the operation and agreed to proceed with the operation after discussing the risks and benefits as outlined.  DESCRIPTION OF PROCEDURE:  The patient was brought to the operating room and placed supine on the operating table where  general anesthesia was induced. The left chest and axilla were prepped and draped as a sterile field.  The previous AICD pocket was opened and the AICD device (endotach gem) was removed from the pocket.  An incision in the left anterior axillary line at the fifth interspace was then performed and a subcutaneous pocket was created for placement of three new sensing leads arranged in an array from a common linkage point.  The sensing electrode from the original AICD system was removed from the generator and capped with a plastic lead cap.  The array of three new sensing electrodes were then placed over the left anterolateral chest wall using the Guidant system of the introducing leads and sheaths.  The pockets were irrigated with Vancomycin irrigation.  The common lead from the array was then tunneled back underneath the pectoralis muscle into the AICD generator pocket and connected to the sensing terminal of the AICD generator (positive electrode).  The device was then interrogated and tested by Dr. Graciela Husbands which included several trials of inducing ventricular tachycardia at which time the device properly sensed and either paced or cardioverted the abnormal rhythm.  The AICD was secured into the pocket and the new array of electrodes were also secured in their pocket on the anterior chest wall.  Both pockets were irrigated with antibiotic irrigation, closed in layers using Vicryl.  Sterile dressings were applied.  A chest x-ray was obtained in the operating room.  Prior to leaving the operating room, there was no pneumothorax and the new electrodes were in a proper orientation in the subcutaneous tissue.  The patient was extubated and returned to the recovery room in stable  condition. Attending Physician:  Eleanora Neighbor DD:  06/26/01 TD:  06/26/01 Job: [id number]

## 2011-02-19 NOTE — Op Note (Signed)
NAMEMarland Kitchen  [name], [name]                          ACCOUNT NO.:  [id number]   MEDICAL RECORD NO.:  [id number]                   PATIENT TYPE:  OIB   LOCATION:  2872                                 FACILITY:  [facility]   PHYSICIAN:  Duke Salvia, M.D. [facility]           DATE OF BIRTH:  Aug 17, 1935   DATE OF PROCEDURE:  05/30/2002  DATE OF DISCHARGE:                                 OPERATIVE REPORT   PREOPERATIVE DIAGNOSES:  Ventricular tachycardia, status post implantable  cardioverter-defibrillator placement, with refractory ventricular status  post ablation, status post subcutaneous array implantation.   POSTOPERATIVE DIAGNOSES:  Ventricular tachycardia, status post implantable  cardioverter-defibrillator placement, with refractory ventricular status  post ablation, status post subcutaneous array implantation.   PROCEDURE PERFORMED:  Explantation of previously-implanted device and  implantation of a new device, with intraoperative defibrillation threshold  testing.   DESCRIPTION OF PROCEDURE:  Following the obtaining of informed consent, the  patient was brought to the electrophysiology laboratory and placed on the  fluoroscopic table in the supine position.  After routine prep and drape,  lidocaine was infiltrated in the prepectoral supraclavicular region along  the line of the previous incision and carried down to the layer of the  device implantation using sharp dissection.  At this point, however, it was  noted that the leads were cephalad and anterior to the device from his  previous device procedure.  Great care was then taken to free up the leads  from the device both anterior and cephalad.  This having been accomplished,  the system was explanted.   The previously-implanted Guidant subcu Array, serial number [id number], was  inspected.  The ventricular defibrillator coil was a model [id number], serial  number [id number], and the atrial lead was a 6940, [id number].  The atrial  lead was  analyzed with a P-wave of 2, a pacing threshold of 0.9 and 0.5,  impedance of 739, and current threshold of 1.6 MA.  The R-wave was 16.6  millivolts with an impedance of 825, a pacing threshold of 0.5 and 0.5 with  impedance of 0.78.  The subcu Array impedance was 719.  In the distal coil  the impedance was 690.   With these acceptable parameters recorded, the lead having been freed up  more extensively, the lead was then attached to a [employer] Sarben [id number]  ICD, serial number [id number].  Through the device the bipolar P-wave was  1.2 millivolts with an impedance of 600 ohms and a pacing threshold of 1  volt at 0.4 msec.  The R-wave was 13.2 millivolts with a pacing impedance of  704 ohms and a threshold of 1 volt at 0.2 msec.   The pocket was copiously irrigated with antibiotic-containing saline  solution, and the leads and the pulse generator were then placed inside the  pocket with great care to try and minimize the redundant bends of the curve  and to put as much  of the leads behind the can as we could.   It was then attempted to try and put the patient to sleep using Versed and  fentanyl.  This turned out to be unsuccessful probably because the patient  was using a great deal of outpatient Xanax.  Anesthesia under the care of  [employer]. Zoila Shutter, M.D., was then consulted, and they used etomidate.  Ventricular fibrillation was then induced.  After a total duration of six  seconds, a 20-joule shock was delivered through a measured resistance of 42  ohms following the induction of ventricular fibrillation, restoring AV paced  rhythm.   With these acceptable parameters recorded, the procedure was terminated.  It  should be noted that prior to the anesthesia, because of the duration, the  pocket had been closed in three layers in the normal fashion.   We were also concerned about the patient's productive cough and his  saturations that were in the high 80s to low 90s.  Because  of this, we will  plan to observe the patient overnight and resume antibiotics.                                                Duke Salvia, M.D. [facility]    SCK/MEDQ  D:  05/30/2002  T:  06/04/2002  Job:  [id number]   cc:   Colleen Can. Deborah Chalk, M.D.  [address]  [place]  [place] [address]  Fax: [phone]   Electrophysiology Lab   [place]   [place] office, [place], [place]

## 2011-02-19 NOTE — Consult Note (Signed)
[name]  Patient:    [name]                        MRN: [id number] Proc. Date: 05/04/00 Adm. Date:  [id number] Attending:  Eleanora Neighbor CC:         Electrophysiology laboratory  Colleen Can. Deborah Chalk, M.D.  Delsa Grana, R.N., [facility] in [place]   Consultation Report  REASON FOR CONSULTATION:  Thank you very much for asking me to see [name] for electrophysiology consultation for wide complex tachycardia in the setting of ischemic heart disease.  [name] is a 76 year old gentleman with a history of myocardial infarction in 1984, PTCA in 1992 with CABG in the year 2000 with a LIMA to his diagonal and a vein graft to his RCA and PDA and a vein graft to his diagonal done by Dr. Donata Clay.  Since that time he has done relatively well without exercise limitations.  On the day of admission (May 01, 2000) the patient developed abrupt onset of tachy palpitations and presyncope.  He was taken to the outside hospital where a diagnoses of SVT was made.  Adenosine was given and then he was subsequently cardioverted with 100 joules.  He was transferred to [facility] for [facility] evaluation.  He subsequently ruled in for myocardial infarction with a peak MB of 84.  He underwent cardiac catheterization by Dr. Roger Shelter demonstrating an ejection fraction of 30%.  His vein graft to his LAD was patent but he had 99% disease in his native and this was PTCAd.  His LIMA to his diagonal was okay.  His vein graft to his ______ PD and his vein graft to his intermediate were also okay.  There was a circumflex lesion of 90% and this was submitted for revascularization as well.  He is now submitted for electrophysiological testing.  As noted the patient has no functional limitations.  He has no orthopnea or pedal edema and no other episodes of presyncope or tachycardia palpitations apart from the one  mentioned above.  PAST MEDICAL HISTORY:  Notable for diabetes, hyperlipidemia and a history of hypertension.  PAST SURGICAL HISTORY:  Notable for back surgery and tonsillectomy in addition to his bypass surgery.  REVIEW OF SYSTEMS:  Noncontributory.  MEDICATIONS ON ADMISSION: 1. Glucotrol 2. Lopid 3. Atenolol 4. Cimetidine 5. Aspirin 6. Lipitor  MEDICATIONS PRESENT INCLUDE: 7. Lopressor 50 b.i.d. 8. Altace 2.5 q.d 9. Nitropaste.  ALLERGIES:  No known drug allergies.  SOCIAL HISTORY:  He is married x 3.  He has 4 children.  He is currently married to his wife of the last 15 years.  He is a nonsmoker.  PHYSICAL EXAMINATION:  GENERAL:  He is in no acute distress.  VITAL SIGNS:  Blood pressure is 110/50.  He is an elderly Caucasian male. Heart rates were 58.  NECK:  Neck veins were flat.  Carotids were brisk and full bilaterally without bruits.  HEENT:  Normocephalic and atraumatic no scleral icterus, no xanthoma.  BACK:  Without kyphosis or scoliosis.  LUNGS:  Clear.  His PMI was displaced.  S1 was diminished.  S2 was split. There is no significant murmurs.  ABDOMEN:  Soft with active bowel sounds without hepatomegaly or midline pulsation.  EXTREMITIES:  Femoral pulses were 2+.  Distal pulses were intact.  There is no clubbing, cyanosis, or edema.  NEUROLOGIC:  Grossly normal.  Electrocardiogram done  April 25, 2000, demonstrated sinus rhythm at 64 with intervals of 0.22/0.09/ ______ with an axis that is leftward at minus 30 degrees.  Electrocardiogram of his tachycardia demonstrated a right axis deviation tachycardia with right bundle-branch block and precordial concordants at a cycle length of 320 milliseconds.  IMPRESSION: 1. Ventricular tachycardia based on precordial concordants, cycle length of    320. 2. Ischemic cardiomyopathy with:    a. Prior MI.    b. Status post CABG.    c. Status post post CABG PCI.    d. Non Q wave MI with this episode.     e. EF of 30%. 3. Bradycardia with first degree AV block. 4. Diabetes mellitus, hyperlipidemia, hypertension. 5. Functional class 1 to 2.  DISCUSSION:  [name] has ventricular tachycardia in the setting of ischemic heart disease.  Almost always this is related to substrate and is not related specifically to myocardial infarction.  Electrophysiological testing is indicated to assess this substrate for persistent risk for ventricular arrhythmias.  I have discussed these risks with him and his wife, and he understands and is willing to proceed.  RECOMMENDATIONS:  Based on the above therefore: 1. Undertake electrophysiological testing. 2. No driving x 6 months. DD:  05/04/00 TD:  05/04/00 Job: [id number]

## 2011-02-25 ENCOUNTER — Encounter: Payer: Self-pay | Admitting: Nurse Practitioner

## 2011-03-02 ENCOUNTER — Encounter: Payer: Self-pay | Admitting: Nurse Practitioner

## 2011-03-02 ENCOUNTER — Ambulatory Visit (INDEPENDENT_AMBULATORY_CARE_PROVIDER_SITE_OTHER): Payer: Medicare Other | Admitting: *Deleted

## 2011-03-02 ENCOUNTER — Ambulatory Visit (INDEPENDENT_AMBULATORY_CARE_PROVIDER_SITE_OTHER): Payer: Medicare Other | Admitting: Nurse Practitioner

## 2011-03-02 ENCOUNTER — Other Ambulatory Visit (INDEPENDENT_AMBULATORY_CARE_PROVIDER_SITE_OTHER): Payer: Medicare Other | Admitting: *Deleted

## 2011-03-02 VITALS — BP 152/78 | HR 60 | Ht 69.0 in | Wt 217.8 lb

## 2011-03-02 DIAGNOSIS — I4891 Unspecified atrial fibrillation: Secondary | ICD-10-CM

## 2011-03-02 DIAGNOSIS — Z79899 Other long term (current) drug therapy: Secondary | ICD-10-CM

## 2011-03-02 DIAGNOSIS — I2589 Other forms of chronic ischemic heart disease: Secondary | ICD-10-CM

## 2011-03-02 DIAGNOSIS — Z7901 Long term (current) use of anticoagulants: Secondary | ICD-10-CM

## 2011-03-02 DIAGNOSIS — I472 Ventricular tachycardia: Secondary | ICD-10-CM

## 2011-03-02 DIAGNOSIS — R259 Unspecified abnormal involuntary movements: Secondary | ICD-10-CM

## 2011-03-02 LAB — BASIC METABOLIC PANEL
BUN: 14 mg/dL (ref 6–23)
CO2: 25 mEq/L (ref 19–32)
Calcium: 8.9 mg/dL (ref 8.4–10.5)
Creatinine, Ser: 1.5 mg/dL (ref 0.4–1.5)
Glucose, Bld: 118 mg/dL — ABNORMAL HIGH (ref 70–99)

## 2011-03-02 LAB — CBC WITH DIFFERENTIAL/PLATELET
Eosinophils Relative: 0.6 % (ref 0.0–5.0)
HCT: 39 % (ref 39.0–52.0)
Hemoglobin: 13.5 g/dL (ref 13.0–17.0)
Lymphs Abs: 1.4 10*3/uL (ref 0.7–4.0)
Monocytes Relative: 8.5 % (ref 3.0–12.0)
Neutro Abs: 4 10*3/uL (ref 1.4–7.7)
RDW: 14.6 % (ref 11.5–14.6)
WBC: 6 10*3/uL (ref 4.5–10.5)

## 2011-03-02 LAB — TSH: TSH: 2.6 u[IU]/mL (ref 0.35–5.50)

## 2011-03-02 MED ORDER — LOSARTAN POTASSIUM 50 MG PO TABS: 50.0000 mg | ORAL_TABLET | Freq: Every day | ORAL | Status: DC

## 2011-03-02 NOTE — Assessment & Plan Note (Signed)
He remains on his coumadin. He does have a tendency towards bradycardia. Fortunately, he has not had recurrence of his atrial fib. We will continue with his amiodarone.
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
## 2011-03-02 NOTE — Assessment & Plan Note (Signed)
Coumadin is therapeutic and will be rechecked in 4 weeks. No complications noted.

## 2011-03-02 NOTE — Assessment & Plan Note (Signed)
I have restarted the Losartan at 50 mg daily. He will need follow up in one month. He is very resistant to up titration of his medicines as a general rule. BMET will need to be rechecked on return as well. He will be follow up with Dr. Antoine Poche with Dr. Ronnald Nian upcoming retirement.

## 2011-03-02 NOTE — Assessment & Plan Note (Signed)
He remains on his amiodarone just 5 days per week. Labs are checked in follow up today.

## 2011-03-02 NOTE — Patient Instructions (Signed)
Lets get you back on Losartan at 50 mg each day. You need to be seen back in 1 month. I will have you see Dr. Antoine Poche in one month for follow up. You need to talk with Dr. Waynard Edwards with your anxiety/tremor issues. Keep on your same dose of coumadin and recheck in 4 weeks.

## 2011-03-02 NOTE — Assessment & Plan Note (Signed)
He remains on chronic Xanax use per Dr. Waynard Edwards.

## 2011-03-04 ENCOUNTER — Telehealth: Payer: Self-pay | Admitting: *Deleted

## 2011-03-04 NOTE — Telephone Encounter (Signed)
Labs reported 

## 2011-03-04 NOTE — Telephone Encounter (Signed)
LMOM for pt to call back for lab report

## 2011-03-22 ENCOUNTER — Ambulatory Visit (INDEPENDENT_AMBULATORY_CARE_PROVIDER_SITE_OTHER): Payer: Medicare Other | Admitting: *Deleted

## 2011-03-22 DIAGNOSIS — I4891 Unspecified atrial fibrillation: Secondary | ICD-10-CM

## 2011-04-06 ENCOUNTER — Encounter: Payer: Self-pay | Admitting: Cardiology

## 2011-04-06 ENCOUNTER — Ambulatory Visit (INDEPENDENT_AMBULATORY_CARE_PROVIDER_SITE_OTHER): Payer: Medicare Other | Admitting: Cardiology

## 2011-04-06 ENCOUNTER — Ambulatory Visit: Payer: Medicare Other | Admitting: Cardiology

## 2011-04-06 VITALS — BP 148/76 | HR 61 | Resp 18 | Ht 70.0 in | Wt 196.0 lb

## 2011-04-06 DIAGNOSIS — I5022 Chronic systolic (congestive) heart failure: Secondary | ICD-10-CM

## 2011-04-06 DIAGNOSIS — R259 Unspecified abnormal involuntary movements: Secondary | ICD-10-CM

## 2011-04-06 DIAGNOSIS — I219 Acute myocardial infarction, unspecified: Secondary | ICD-10-CM

## 2011-04-06 DIAGNOSIS — I5023 Acute on chronic systolic (congestive) heart failure: Secondary | ICD-10-CM

## 2011-04-06 DIAGNOSIS — I4891 Unspecified atrial fibrillation: Secondary | ICD-10-CM

## 2011-04-06 DIAGNOSIS — E785 Hyperlipidemia, unspecified: Secondary | ICD-10-CM

## 2011-04-06 DIAGNOSIS — I10 Essential (primary) hypertension: Secondary | ICD-10-CM

## 2011-04-06 DIAGNOSIS — I2510 Atherosclerotic heart disease of native coronary artery without angina pectoris: Secondary | ICD-10-CM

## 2011-04-06 LAB — TSH: TSH: 2.42 u[IU]/mL (ref 0.35–5.50)

## 2011-04-06 LAB — T3, FREE: T3, Free: 2.8 pg/mL (ref 2.3–4.2)

## 2011-04-06 MED ORDER — CARVEDILOL 3.125 MG PO TABS: 3.1250 mg | ORAL_TABLET | Freq: Two times a day (BID) | ORAL | Status: DC

## 2011-04-06 NOTE — Progress Notes (Signed)
HPI  No Known Allergies  Current Outpatient Prescriptions  Medication Sig Dispense Refill  . ALPRAZolam (XANAX) 0.5 MG tablet Take 0.5 mg by mouth at bedtime as needed.        Marland Kitchen amiodarone (PACERONE) 100 MG tablet Take 100 mg by mouth daily. Taking 5 days a week Mon-Fri      . aspirin 81 MG tablet Take 81 mg by mouth daily.        Marland Kitchen atorvastatin (LIPITOR) 10 MG tablet Take 10 mg by mouth daily.        Marland Kitchen escitalopram (LEXAPRO) 20 MG tablet Take 20 mg by mouth daily.        . finasteride (PROSCAR) 5 MG tablet Take 5 mg by mouth daily.        Marland Kitchen levothyroxine (SYNTHROID, LEVOTHROID) 25 MCG tablet Take 25 mcg by mouth daily.        Marland Kitchen losartan (COZAAR) 50 MG tablet Take 1 tablet (50 mg total) by mouth daily.  30 tablet  11  . saw palmetto 160 MG capsule Take 160 mg by mouth 2 (two) times daily.        . sitaGLIPtan (JANUVIA) 100 MG tablet Take 50 mg by mouth daily.       Marland Kitchen warfarin (COUMADIN) 5 MG tablet Take 5 mg by mouth as directed.        . carvedilol (COREG) 3.125 MG tablet Take 1 tablet (3.125 mg total) by mouth 2 (two) times daily.  60 tablet  11    Past Medical History  Diagnosis Date  . Chest discomfort   . Low back pain   . Ischemic cardiomyopathy     EF 23%  . Hearing loss   . SOB (shortness of breath)   . Joint pain   . VT (ventricular tachycardia)     s/p ICD explanted for infection.  The decision has been not to reimplant this given these difficulties.  . Anxiety   . Depression   . PAF (paroxysmal atrial fibrillation)   . Hyperlipidemia   . Gynecomastia   . BPH (benign prostatic hypertrophy)   . Diabetes mellitus   . Hypertension   . Degenerative joint disease   . GERD (gastroesophageal reflux disease)   . CAD (coronary artery disease)     Past Surgical History  Procedure Date  . Cardiac defibrillator removal   . Coronary artery bypass graft 2000    X5  . Cardiac catheterization 12/25/2008    THE LEFT VENTRICLE WAS ENLARGED. THERE IS ANTERIOR APICAL AND  INFERIOR APICAL AKINESIA. EF ESTIMATED 20%. NO MITRAL REGURGITATION.  . Cardiac catheterization 12/2008    REPEAT CATH, SHOWED BYPASS GRAFTS WERE PATENT  . US echocardiography 2007    EF 20 to 25%    ROS: Anxiety, tremor. Otherwise as stated in the HPI and negative for all other systems.  PHYSICAL EXAM BP 148/76  Pulse 61  Resp 18  Ht 5\' 10"  (1.778 m)  Wt 196 lb (88.905 kg)  BMI 28.12 kg/m2 GENERAL:  Well appearing HEENT:  Pupils equal round and reactive, fundi not visualized, oral mucosa unremarkable NECK:  No jugular venous distention, waveform within normal limits, carotid upstroke brisk and symmetric, no bruits, no thyromegaly LYMPHATICS:  No cervical, inguinal adenopathy LUNGS:  Clear to auscultation bilaterally BACK:  No CVA tenderness CHEST:  Well healed sternotomy scar, ICD scar healed. HEART:  PMI not displaced or sustained,S1 and S2 within normal limits, no S3, no S4, no clicks, no rubs, no  murmurs ABD:  Flat, positive bowel sounds normal in frequency in pitch, no bruits, no rebound, no guarding, no midline pulsatile mass, no hepatomegaly, no splenomegaly EXT:  2 plus pulses throughout, no edema, no cyanosis no clubbing SKIN:  No rashes no nodules NEURO:  Cranial nerves II through XII grossly intact, motor grossly intact throughout PSYCH:  Cognitively intact, oriented to person place and time  EKG:  Sinus rhythm with first degree AV block, IVCD, old inferior infarct, anterolateral infarct  ASSESSMENT AND PLAN

## 2011-04-06 NOTE — Assessment & Plan Note (Signed)
The patient presents for followup of a complicated past cardiac history. At this point he has no overt cardiovascular symptoms. I have reviewed extensively previous notes. (Greater than 40 minutes with patient and chart).  The one medication missing from this therapy is a beta blocker. I do note some bradycardia in the past I am going to try to start carvedilol 3.125 mg b.i.d. At this point no further cardiovascular testing is suggested. We will continue with risk reduction.

## 2011-04-06 NOTE — Assessment & Plan Note (Signed)
He seems to be euvolemic.  At this point, no change in therapy is indicated.  We have reviewed salt and fluid restrictions.  No further cardiovascular testing is indicated.   

## 2011-04-06 NOTE — Patient Instructions (Signed)
Please start Carvedilol 3.125 mg  One twice a day. You may take 1/2 tablet of Xanax as needed. Continue all other medications as listed. Follow up with Dr Antoine Poche in 3 months.

## 2011-04-06 NOTE — Assessment & Plan Note (Signed)
His biggest complaint seems to be anxiety. I told him that should be, and it looks like it has been, managed by his primary physician. I did tell him to try taking half of his Xanax two or three times daily if he is particularly anxious.  Otherwise I will defer to Ezequiel Kayser, MD

## 2011-04-06 NOTE — Assessment & Plan Note (Signed)
He tolerates coumadin and he will continue on this drug.

## 2011-04-20 ENCOUNTER — Ambulatory Visit (INDEPENDENT_AMBULATORY_CARE_PROVIDER_SITE_OTHER): Payer: Medicare Other | Admitting: *Deleted

## 2011-04-20 DIAGNOSIS — I4891 Unspecified atrial fibrillation: Secondary | ICD-10-CM

## 2011-05-18 ENCOUNTER — Ambulatory Visit (INDEPENDENT_AMBULATORY_CARE_PROVIDER_SITE_OTHER): Payer: Medicare Other | Admitting: *Deleted

## 2011-05-18 DIAGNOSIS — I4891 Unspecified atrial fibrillation: Secondary | ICD-10-CM

## 2011-05-18 LAB — POCT INR: INR: 2.4

## 2011-06-15 ENCOUNTER — Ambulatory Visit (INDEPENDENT_AMBULATORY_CARE_PROVIDER_SITE_OTHER): Payer: Medicare Other | Admitting: *Deleted

## 2011-06-15 DIAGNOSIS — I4891 Unspecified atrial fibrillation: Secondary | ICD-10-CM

## 2011-07-12 ENCOUNTER — Ambulatory Visit (INDEPENDENT_AMBULATORY_CARE_PROVIDER_SITE_OTHER): Payer: Medicare Other | Admitting: Cardiology

## 2011-07-12 ENCOUNTER — Encounter: Payer: Self-pay | Admitting: Cardiology

## 2011-07-12 DIAGNOSIS — I5023 Acute on chronic systolic (congestive) heart failure: Secondary | ICD-10-CM

## 2011-07-12 DIAGNOSIS — I2510 Atherosclerotic heart disease of native coronary artery without angina pectoris: Secondary | ICD-10-CM

## 2011-07-12 DIAGNOSIS — I4891 Unspecified atrial fibrillation: Secondary | ICD-10-CM

## 2011-07-12 NOTE — Assessment & Plan Note (Signed)
I tried to start a beta blocker but he did not tolerate this.  I will not try this again.  He will remain on meds as listed.

## 2011-07-12 NOTE — Patient Instructions (Signed)
Continue current medications  Follow up in 6 months with Dr Hochrein.  You will receive a letter in the mail 2 months before you are due.  Please call us when you receive this letter to schedule your follow up appointment.  

## 2011-07-12 NOTE — Progress Notes (Signed)
HPI The patient presents for follow up of CAD.  At the last visit I tried to start Coreg.  He says it made his heart beat too strong.  He stopped this. The patient denies any new symptoms such as chest discomfort, neck or arm discomfort. There has been no new shortness of breath, PND or orthopnea. There have been no reported presyncope or syncope.  He does have chronic muscle aches and complains of neck and shoulder pain.  He is also anxious and has tremors and problems with his gait.  No Known Allergies  Current Outpatient Prescriptions  Medication Sig Dispense Refill  . ALPRAZolam (XANAX) 0.5 MG tablet Take 0.5 mg by mouth at bedtime as needed.        Marland Kitchen amiodarone (PACERONE) 100 MG tablet Take 100 mg by mouth daily. Taking 5 days a week Mon-Fri      . aspirin 81 MG tablet Take 81 mg by mouth daily.        Marland Kitchen atorvastatin (LIPITOR) 10 MG tablet Take 10 mg by mouth daily.        . carvedilol (COREG) 3.125 MG tablet Take 1 tablet (3.125 mg total) by mouth 2 (two) times daily.  60 tablet  11  . escitalopram (LEXAPRO) 20 MG tablet Take 20 mg by mouth daily.        . finasteride (PROSCAR) 5 MG tablet Take 5 mg by mouth daily.        Marland Kitchen ketorolac (ACULAR) 0.4 % SOLN as directed.      Marland Kitchen levothyroxine (SYNTHROID, LEVOTHROID) 25 MCG tablet Take 25 mcg by mouth daily.        Marland Kitchen losartan (COZAAR) 50 MG tablet Take 1 tablet (50 mg total) by mouth daily.  30 tablet  11  . saw palmetto 160 MG capsule Take 160 mg by mouth 2 (two) times daily.        . sitaGLIPtan (JANUVIA) 100 MG tablet Take 50 mg by mouth daily.       Marland Kitchen warfarin (COUMADIN) 5 MG tablet Take 5 mg by mouth as directed.          Past Medical History  Diagnosis Date  . Chest discomfort   . Low back pain   . Ischemic cardiomyopathy     EF 23%  . Hearing loss   . SOB (shortness of breath)   . Joint pain   . VT (ventricular tachycardia)     s/p ICD explanted for infection.  The decision has been not to reimplant this given these  difficulties.  . Anxiety   . Depression   . PAF (paroxysmal atrial fibrillation)   . Hyperlipidemia   . Gynecomastia   . BPH (benign prostatic hypertrophy)   . Diabetes mellitus   . Hypertension   . Degenerative joint disease   . GERD (gastroesophageal reflux disease)   . CAD (coronary artery disease)     Past Surgical History  Procedure Date  . Cardiac defibrillator removal   . Coronary artery bypass graft 2000    X5  . Cardiac catheterization 12/25/2008    THE LEFT VENTRICLE WAS ENLARGED. THERE IS ANTERIOR APICAL AND INFERIOR APICAL AKINESIA. EF ESTIMATED 20%. NO MITRAL REGURGITATION.  . Cardiac catheterization 12/2008    REPEAT CATH, SHOWED BYPASS GRAFTS WERE PATENT  . US echocardiography 2007    EF 20 to 25%    ROS: Anxiety, tremor. Otherwise as stated in the HPI and negative for all other systems.  PHYSICAL EXAM BP 138/76  Pulse 58  Ht 5\' 10"  (1.778 m)  Wt 215 lb (97.523 kg)  BMI 30.85 kg/m2 GENERAL:  Well appearing HEENT:  Pupils equal round and reactive, fundi not visualized, oral mucosa unremarkable NECK:  No jugular venous distention, waveform within normal limits, carotid upstroke brisk and symmetric, no bruits, no thyromegaly LYMPHATICS:  No cervical, inguinal adenopathy LUNGS:  Clear to auscultation bilaterally BACK:  No CVA tenderness CHEST:  Well healed sternotomy scar, ICD scar healed. HEART:  PMI not displaced or sustained,S1 and S2 within normal limits, no S3, no S4, no clicks, no rubs, no murmurs ABD:  Flat, positive bowel sounds normal in frequency in pitch, no bruits, no rebound, no guarding, no midline pulsatile mass, no hepatomegaly, no splenomegaly EXT:  2 plus pulses throughout, no edema, no cyanosis no clubbing SKIN:  No rashes no nodules NEURO:  Cranial nerves II through XII grossly intact, motor grossly intact throughout PSYCH:  Cognitively intact, oriented to person place and time, tremor  EKG:  Sinus rhythm rate 58 with first degree AV  block, IVCD, old inferior infarct, anterolateral infarct  ASSESSMENT AND PLAN

## 2011-07-12 NOTE — Assessment & Plan Note (Signed)
While it is true that amiodarone might be leading to some weakness or even ataxia, I doubt this.  I discussed this with the patient and his wife.  I think that he should continue the meds as listed.  He tolerates blood thinner. I will make sure that he is up to date with labs.

## 2011-07-12 NOTE — Assessment & Plan Note (Signed)
The patient has no new sypmtoms.  No further cardiovascular testing is indicated.  We will continue with aggressive risk reduction and meds as listed.  

## 2011-07-13 ENCOUNTER — Ambulatory Visit (INDEPENDENT_AMBULATORY_CARE_PROVIDER_SITE_OTHER): Payer: Medicare Other | Admitting: *Deleted

## 2011-07-13 DIAGNOSIS — I4891 Unspecified atrial fibrillation: Secondary | ICD-10-CM

## 2011-07-23 ENCOUNTER — Ambulatory Visit (HOSPITAL_COMMUNITY): Payer: Medicare Other

## 2011-07-23 ENCOUNTER — Ambulatory Visit (HOSPITAL_COMMUNITY)
Admission: RE | Admit: 2011-07-23 | Discharge: 2011-07-23 | Disposition: A | Discharge status: Home / Self Care | Payer: Medicare Other | Source: Ambulatory Visit | Attending: Ophthalmology | Admitting: Ophthalmology

## 2011-07-23 DIAGNOSIS — E119 Type 2 diabetes mellitus without complications: Secondary | ICD-10-CM | POA: Insufficient documentation

## 2011-07-23 DIAGNOSIS — I10 Essential (primary) hypertension: Secondary | ICD-10-CM | POA: Insufficient documentation

## 2011-07-23 DIAGNOSIS — Z9889 Other specified postprocedural states: Secondary | ICD-10-CM

## 2011-07-23 DIAGNOSIS — H35349 Macular cyst, hole, or pseudohole, unspecified eye: Secondary | ICD-10-CM | POA: Insufficient documentation

## 2011-07-23 DIAGNOSIS — I2510 Atherosclerotic heart disease of native coronary artery without angina pectoris: Secondary | ICD-10-CM | POA: Insufficient documentation

## 2011-07-23 LAB — PROTIME-INR: INR: 2.28 — ABNORMAL HIGH (ref 0.00–1.49)

## 2011-07-23 LAB — CBC
HCT: 36 % — ABNORMAL LOW (ref 39.0–52.0)
Hemoglobin: 12.5 g/dL — ABNORMAL LOW (ref 13.0–17.0)
MCH: 30.5 pg (ref 26.0–34.0)
MCHC: 34.7 g/dL (ref 30.0–36.0)
RDW: 14.2 % (ref 11.5–15.5)

## 2011-07-23 LAB — SURGICAL PCR SCREEN
MRSA, PCR: NEGATIVE
Staphylococcus aureus: NEGATIVE

## 2011-07-23 LAB — BASIC METABOLIC PANEL WITH GFR
BUN: 14 mg/dL (ref 6–23)
CO2: 27 meq/L (ref 19–32)
Calcium: 9.1 mg/dL (ref 8.4–10.5)
Chloride: 101 meq/L (ref 96–112)
Creatinine, Ser: 1.48 mg/dL — ABNORMAL HIGH (ref 0.50–1.35)
GFR calc Af Amer: 52 mL/min — ABNORMAL LOW
GFR calc non Af Amer: 45 mL/min — ABNORMAL LOW
Glucose, Bld: 102 mg/dL — ABNORMAL HIGH (ref 70–99)
Potassium: 3.9 meq/L (ref 3.5–5.1)
Sodium: 136 meq/L (ref 135–145)

## 2011-07-23 LAB — GLUCOSE, CAPILLARY
Glucose-Capillary: 111 mg/dL — ABNORMAL HIGH (ref 70–99)
Glucose-Capillary: 82 mg/dL (ref 70–99)

## 2011-08-09 ENCOUNTER — Other Ambulatory Visit: Payer: Self-pay | Admitting: *Deleted

## 2011-08-09 MED ORDER — WARFARIN SODIUM 5 MG PO TABS: 5.0000 mg | ORAL_TABLET | ORAL | Status: DC

## 2011-08-13 ENCOUNTER — Ambulatory Visit (INDEPENDENT_AMBULATORY_CARE_PROVIDER_SITE_OTHER): Payer: Medicare Other | Admitting: *Deleted

## 2011-08-13 DIAGNOSIS — Z7901 Long term (current) use of anticoagulants: Secondary | ICD-10-CM

## 2011-08-13 DIAGNOSIS — I4891 Unspecified atrial fibrillation: Secondary | ICD-10-CM

## 2011-08-20 ENCOUNTER — Other Ambulatory Visit: Payer: Self-pay | Admitting: Cardiology

## 2011-09-10 ENCOUNTER — Ambulatory Visit (INDEPENDENT_AMBULATORY_CARE_PROVIDER_SITE_OTHER): Payer: Medicare Other | Admitting: *Deleted

## 2011-09-10 DIAGNOSIS — I4891 Unspecified atrial fibrillation: Secondary | ICD-10-CM

## 2011-09-10 DIAGNOSIS — Z7901 Long term (current) use of anticoagulants: Secondary | ICD-10-CM

## 2011-09-14 ENCOUNTER — Encounter (HOSPITAL_COMMUNITY): Payer: Self-pay

## 2011-09-22 ENCOUNTER — Other Ambulatory Visit: Payer: Self-pay | Admitting: Ophthalmology

## 2011-09-22 ENCOUNTER — Other Ambulatory Visit (HOSPITAL_COMMUNITY): Payer: Self-pay

## 2011-09-22 ENCOUNTER — Encounter (HOSPITAL_COMMUNITY): Payer: Self-pay

## 2011-09-22 NOTE — Progress Notes (Signed)
Called Dr. Rankin's office, spoke with Jennifer, requested orders. 

## 2011-09-23 NOTE — H&P (Signed)
[name] is an 76 y.o. male.   Chief Complaint:VISION LOSS,  LEFT EYE HPI: PAINLESS LOSS OF VISION, MACULAR HOLE OS  Past Medical History  Diagnosis Date  . Chest discomfort   . Low back pain   . Ischemic cardiomyopathy     EF 23%  . Hearing loss   . SOB (shortness of breath)   . Joint pain   . VT (ventricular tachycardia)     s/p ICD explanted for infection.  The decision has been not to reimplant this given these difficulties.  . Anxiety   . Depression   . PAF (paroxysmal atrial fibrillation)   . Hyperlipidemia   . Gynecomastia   . BPH (benign prostatic hypertrophy)   . Diabetes mellitus   . Hypertension   . Degenerative joint disease   . GERD (gastroesophageal reflux disease)   . CAD (coronary artery disease)   . Pneumonia     Past Surgical History  Procedure Date  . Cardiac defibrillator removal   . Coronary artery bypass graft 2000    X5  . Cardiac catheterization 12/25/2008    THE LEFT VENTRICLE WAS ENLARGED. THERE IS ANTERIOR APICAL AND INFERIOR APICAL AKINESIA. EF ESTIMATED 20%. NO MITRAL REGURGITATION.  . Cardiac catheterization 12/2008    REPEAT CATH, SHOWED BYPASS GRAFTS WERE PATENT  . US echocardiography 2007    EF 20 to 25%  . Ablation of dysrhythmic focus     No family history on file. Social History:  reports that he has never smoked. He has never used smokeless tobacco. He reports that he drinks alcohol. He reports that he does not use illicit drugs.  Allergies: No Known Allergies  No current facility-administered medications on file as of 07/23/2011.   Medications Prior to Admission  Medication Sig Dispense Refill  . ALPRAZolam (XANAX) 0.5 MG tablet Take 0.5 mg by mouth 3 (three) times daily as needed. For anxiety.      Marland Kitchen amiodarone (PACERONE) 100 MG tablet Take 100 mg by mouth See admin instructions. Taking 5 days a week Mon-Fri      . aspirin 81 MG tablet Take 81 mg by mouth daily.        Marland Kitchen atorvastatin (LIPITOR) 10 MG tablet Take 10 mg by  mouth daily.        . finasteride (PROSCAR) 5 MG tablet Take 5 mg by mouth daily.        Marland Kitchen losartan (COZAAR) 50 MG tablet Take 1 tablet (50 mg total) by mouth daily.  30 tablet  11  . saw palmetto 160 MG capsule Take 160 mg by mouth daily.         No results found for this or any previous visit (from the past 48 hour(s)). No results found.  Review of Systems  Constitutional: Positive for malaise/fatigue.  HENT: Negative.   Eyes: Positive for blurred vision.  Respiratory: Negative.   Cardiovascular: Negative.   Gastrointestinal: Negative.   Genitourinary: Negative.   Musculoskeletal: Negative.   Skin: Negative.   Neurological: Negative.   Endo/Heme/Allergies: Negative.   Psychiatric/Behavioral: Negative.     There were no vitals taken for this visit. Physical Exam  Constitutional: He is oriented to person, place, and time. He appears well-developed. No distress.  HENT:  Head: Normocephalic and atraumatic.  Eyes: Conjunctivae, EOM and lids are normal. Pupils are equal, round, and reactive to light.       MACULAR HOLE OS  Neck: Trachea normal and normal range of motion. Neck supple.  Cardiovascular: Normal pulses.  An irregular rhythm present.  Respiratory: Effort normal.  GI: Soft.  Musculoskeletal: Normal range of motion.  Neurological: He is alert and oriented to person, place, and time.  Skin: Skin is warm. He is not diaphoretic.  Psychiatric: He has a normal mood and affect. His behavior is normal. Judgment normal. Cognition and memory are normal.     Assessment/Plan MACULAR HOLE OS,  PLAN IS SURGICAL REPAIR UNDER LOCAL MAC ANESTHESIA, OF MACULAR HOLE, VIA VITRECTOMY AND MEMBRANE PEEL OS, AND INJECTION OF OIL.  PATIENT UNABLE TO POSTION FOR SURGICAL REPAIR, WILL USE OIL OS   [name] A 09/23/2011, 9:44 PM

## 2011-09-24 ENCOUNTER — Ambulatory Visit (HOSPITAL_COMMUNITY)
Admission: RE | Admit: 2011-09-24 | Discharge: 2011-09-24 | Disposition: A | Discharge status: Home / Self Care | Payer: Medicare Other | Source: Ambulatory Visit | Attending: Ophthalmology | Admitting: Ophthalmology

## 2011-09-24 ENCOUNTER — Encounter (HOSPITAL_COMMUNITY): Payer: Self-pay | Admitting: Critical Care Medicine

## 2011-09-24 ENCOUNTER — Encounter (HOSPITAL_COMMUNITY)
Admission: RE | Disposition: A | Discharge status: Home / Self Care | Payer: Self-pay | Source: Ambulatory Visit | Attending: Ophthalmology

## 2011-09-24 ENCOUNTER — Ambulatory Visit (HOSPITAL_COMMUNITY): Payer: Medicare Other | Admitting: Critical Care Medicine

## 2011-09-24 ENCOUNTER — Encounter (HOSPITAL_COMMUNITY): Payer: Self-pay | Admitting: *Deleted

## 2011-09-24 DIAGNOSIS — H35349 Macular cyst, hole, or pseudohole, unspecified eye: Secondary | ICD-10-CM | POA: Insufficient documentation

## 2011-09-24 DIAGNOSIS — H35342 Macular cyst, hole, or pseudohole, left eye: Secondary | ICD-10-CM

## 2011-09-24 HISTORY — PX: PARS PLANA VITRECTOMY: SHX2166

## 2011-09-24 LAB — BASIC METABOLIC PANEL
GFR calc Af Amer: 52 mL/min — ABNORMAL LOW (ref >90)
GFR calc non Af Amer: 44 mL/min — ABNORMAL LOW (ref >90)
Glucose, Bld: 125 mg/dL — ABNORMAL HIGH (ref 70–99)
Potassium: 3.7 mEq/L (ref 3.5–5.1)
Sodium: 138 mEq/L (ref 135–145)

## 2011-09-24 LAB — CBC
HCT: 37.3 % — ABNORMAL LOW (ref 39.0–52.0)
Hemoglobin: 12.9 g/dL — ABNORMAL LOW (ref 13.0–17.0)
MCH: 30.3 pg (ref 26.0–34.0)
MCHC: 34.6 g/dL (ref 30.0–36.0)
MCV: 87.6 fL (ref 78.0–100.0)
Platelets: 140 10*3/uL — ABNORMAL LOW (ref 150–400)
RBC: 4.26 MIL/uL (ref 4.22–5.81)
RDW: 13.8 % (ref 11.5–15.5)
WBC: 5 10*3/uL (ref 4.0–10.5)

## 2011-09-24 LAB — PROTIME-INR
INR: 2.07 — ABNORMAL HIGH (ref 0.00–1.49)
Prothrombin Time: 23.7 seconds — ABNORMAL HIGH (ref 11.6–15.2)

## 2011-09-24 LAB — APTT: aPTT: 37 seconds (ref 24–37)

## 2011-09-24 LAB — SURGICAL PCR SCREEN
MRSA, PCR: NEGATIVE
Staphylococcus aureus: NEGATIVE

## 2011-09-24 LAB — GLUCOSE, CAPILLARY
Glucose-Capillary: 99 mg/dL (ref 70–99)
Glucose-Capillary: 91 mg/dL (ref 70–99)

## 2011-09-24 SURGERY — PARS PLANA VITRECTOMY WITH 25 GAUGE
Anesthesia: Monitor Anesthesia Care | Laterality: Left

## 2011-09-24 SURGERY — PARS PLANA VITRECTOMY WITH 25 GAUGE
Anesthesia: Monitor Anesthesia Care | Site: Eye | Laterality: Left | Wound class: Clean

## 2011-09-24 MED ORDER — CYCLOPENTOLATE HCL 1 % OP SOLN
OPHTHALMIC | Status: AC
  Administered 2011-09-24: 1 [drp] via OPHTHALMIC
  Filled 2011-09-24: qty 2

## 2011-09-24 MED ORDER — MORPHINE SULFATE 2 MG/ML IJ SOLN: 0.0500 mg/kg | INTRAMUSCULAR | Status: DC | PRN

## 2011-09-24 MED ORDER — DEXAMETHASONE SODIUM PHOSPHATE 10 MG/ML IJ SOLN
INTRAMUSCULAR | Status: DC | PRN
  Administered 2011-09-24: 10 mg

## 2011-09-24 MED ORDER — PROPOFOL 10 MG/ML IV EMUL
INTRAVENOUS | Status: DC | PRN
  Administered 2011-09-24: 40 mg via INTRAVENOUS

## 2011-09-24 MED ORDER — GATIFLOXACIN 0.5 % OP SOLN
1.0000 [drp] | OPHTHALMIC | Status: AC | PRN
  Administered 2011-09-24 (×3): 1 [drp] via OPHTHALMIC

## 2011-09-24 MED ORDER — SODIUM CHLORIDE 0.9 % IV SOLN
INTRAVENOUS | Status: DC
  Administered 2011-09-24: 11:00:00 via INTRAVENOUS

## 2011-09-24 MED ORDER — MUPIROCIN 2 % EX OINT
TOPICAL_OINTMENT | Freq: Two times a day (BID) | CUTANEOUS | Status: DC
  Administered 2011-09-24: 1 via NASAL
  Filled 2011-09-24: qty 22

## 2011-09-24 MED ORDER — MIDAZOLAM HCL 5 MG/5ML IJ SOLN
INTRAMUSCULAR | Status: DC | PRN
  Administered 2011-09-24: 1 mg via INTRAVENOUS

## 2011-09-24 MED ORDER — MEPERIDINE HCL 25 MG/ML IJ SOLN: 6.2500 mg | INTRAMUSCULAR | Status: DC | PRN

## 2011-09-24 MED ORDER — PHENYLEPHRINE HCL 2.5 % OP SOLN
OPHTHALMIC | Status: AC
  Administered 2011-09-24: 1 [drp] via OPHTHALMIC
  Filled 2011-09-24: qty 3

## 2011-09-24 MED ORDER — GATIFLOXACIN 0.5 % OP SOLN
OPHTHALMIC | Status: AC
  Administered 2011-09-24: 1 [drp] via OPHTHALMIC
  Filled 2011-09-24: qty 2.5

## 2011-09-24 MED ORDER — LIDOCAINE HCL 2 % IJ SOLN
INTRAMUSCULAR | Status: DC | PRN
  Administered 2011-09-24: 10 mL

## 2011-09-24 MED ORDER — PROMETHAZINE HCL 25 MG/ML IJ SOLN: 6.2500 mg | INTRAMUSCULAR | Status: DC | PRN

## 2011-09-24 MED ORDER — CYCLOPENTOLATE HCL 1 % OP SOLN
1.0000 [drp] | OPHTHALMIC | Status: AC | PRN
  Administered 2011-09-24 (×3): 1 [drp] via OPHTHALMIC

## 2011-09-24 MED ORDER — ONDANSETRON HCL 4 MG/2ML IJ SOLN
INTRAMUSCULAR | Status: DC | PRN
  Administered 2011-09-24: 4 mg via INTRAVENOUS

## 2011-09-24 MED ORDER — HYDROMORPHONE HCL PF 1 MG/ML IJ SOLN: 0.2500 mg | INTRAMUSCULAR | Status: DC | PRN

## 2011-09-24 MED ORDER — SODIUM CHLORIDE 0.9 % IV SOLN
INTRAVENOUS | Status: DC | PRN
  Administered 2011-09-24: 11:00:00 via INTRAVENOUS

## 2011-09-24 MED ORDER — PHENYLEPHRINE HCL 2.5 % OP SOLN
1.0000 [drp] | OPHTHALMIC | Status: AC | PRN
  Administered 2011-09-24 (×3): 1 [drp] via OPHTHALMIC

## 2011-09-24 MED ORDER — INDOCYANINE GREEN 25 MG IV SOLR
25.0000 mg | INTRAVENOUS | Status: AC
  Administered 2011-09-24: 25 mg via INTRAVENOUS
  Filled 2011-09-24: qty 25

## 2011-09-24 MED ORDER — MIDAZOLAM HCL 2 MG/2ML IJ SOLN: 0.5000 mg | Freq: Once | INTRAMUSCULAR | Status: DC | PRN

## 2011-09-24 MED ORDER — EPINEPHRINE HCL 1 MG/ML IJ SOLN
INTRAOCULAR | Status: DC | PRN
  Administered 2011-09-24: 11:00:00

## 2011-09-24 MED ORDER — BSS PLUS IO SOLN
INTRAOCULAR | Status: DC | PRN
  Administered 2011-09-24: 1 via INTRAOCULAR

## 2011-09-24 MED ORDER — MUPIROCIN 2 % EX OINT
TOPICAL_OINTMENT | CUTANEOUS | Status: AC
  Administered 2011-09-24: 1 via NASAL
  Filled 2011-09-24: qty 22

## 2011-09-24 SURGICAL SUPPLY — 57 items
ACCESSORY FRAGMATOME (MISCELLANEOUS) IMPLANT
APL SRG 3 HI ABS STRL LF PLS (MISCELLANEOUS)
APPLICATOR COTTON TIP 6IN STRL (MISCELLANEOUS) ×2 IMPLANT
APPLICATOR DR MATTHEWS STRL (MISCELLANEOUS) IMPLANT
CANNULA ANT CHAM MAIN (OPHTHALMIC RELATED) IMPLANT
CANNULA FLEX TIP 25G (CANNULA) IMPLANT
CLOTH BEACON ORANGE TIMEOUT ST (SAFETY) ×2 IMPLANT
CORDS BIPOLAR (ELECTRODE) IMPLANT
DRAPE OPHTHALMIC 77X100 STRL (CUSTOM PROCEDURE TRAY) ×2 IMPLANT
EYE SHIELD UNIVERSAL CLEAR (GAUZE/BANDAGES/DRESSINGS) IMPLANT
FILTER BLUE MILLIPORE (MISCELLANEOUS) ×1 IMPLANT
FILTER STRAW FLUID ASPIR (MISCELLANEOUS) ×2 IMPLANT
FORCEPS ECKARDT ILM 25G SERR (OPHTHALMIC RELATED) ×1 IMPLANT
FORCEPS HORIZONTAL 25G DISP (OPHTHALMIC RELATED) IMPLANT
GAS OPHTHALMIC (MISCELLANEOUS) ×1 IMPLANT
GLOVE BIOGEL PI IND STRL 6.5 (GLOVE) IMPLANT
GLOVE BIOGEL PI IND STRL 8 (GLOVE) IMPLANT
GLOVE BIOGEL PI INDICATOR 6.5 (GLOVE) ×1
GLOVE BIOGEL PI INDICATOR 8 (GLOVE) ×1
GLOVE SS BIOGEL STRL SZ 8 (GLOVE) ×1 IMPLANT
GLOVE SUPERSENSE BIOGEL SZ 8 (GLOVE) ×1
GOWN STRL NON-REIN LRG LVL3 (GOWN DISPOSABLE) ×2 IMPLANT
ILLUMINATOR ENDO 25GA (MISCELLANEOUS) ×2 IMPLANT
KIT PERFLUORON PROCEDURE 5ML (MISCELLANEOUS) IMPLANT
KIT ROOM TURNOVER OR (KITS) ×2 IMPLANT
KNIFE CRESCENT 2.5 55 ANG (BLADE) IMPLANT
LENS BIOM SUPER VIEW SET DISP (OPHTHALMIC RELATED) ×2 IMPLANT
MARKER SKIN DUAL TIP RULER LAB (MISCELLANEOUS) ×1 IMPLANT
MICROPICK 25G (MISCELLANEOUS)
NDL 18GX1X1/2 (RX/OR ONLY) (NEEDLE) IMPLANT
NDL 25GX 5/8IN NON SAFETY (NEEDLE) IMPLANT
NEEDLE 18GX1X1/2 (RX/OR ONLY) (NEEDLE) ×6 IMPLANT
NEEDLE 25GX 5/8IN NON SAFETY (NEEDLE) ×2 IMPLANT
NS IRRIG 1000ML POUR BTL (IV SOLUTION) ×2 IMPLANT
PACK VITRECTOMY CUSTOM (CUSTOM PROCEDURE TRAY) ×2 IMPLANT
PAD ARMBOARD 7.5X6 YLW CONV (MISCELLANEOUS) ×3 IMPLANT
PAD EYE OVAL STERILE LF (GAUZE/BANDAGES/DRESSINGS) ×1 IMPLANT
PAK VITRECTOMY PIK 25 GA (OPHTHALMIC RELATED) ×2 IMPLANT
PENCIL BIPOLAR 25GA STR DISP (OPHTHALMIC RELATED) IMPLANT
PICK MICROPICK 25G (MISCELLANEOUS) IMPLANT
PROBE DIRECTIONAL LASER (MISCELLANEOUS) IMPLANT
ROLLS DENTAL (MISCELLANEOUS) IMPLANT
SCRAPER DIAMOND 25GA (OPHTHALMIC RELATED) IMPLANT
SET FLUID INJECTOR (SET/KITS/TRAYS/PACK) IMPLANT
STOCKINETTE IMPERVIOUS 9X36 MD (GAUZE/BANDAGES/DRESSINGS) ×4 IMPLANT
STOPCOCK 4 WAY LG BORE MALE ST (IV SETS) IMPLANT
SUT ETHILON 10 0 CS140 6 (SUTURE) IMPLANT
SUT ETHILON 8 0 BV130 4 (SUTURE) IMPLANT
SUT MERSILENE 5 0 RD 1 DA (SUTURE) IMPLANT
SUT PROLENE 10 0 CIF 4 DA (SUTURE) IMPLANT
SUT VICRYL 7 0 TG140 8 (SUTURE) IMPLANT
SYR 30ML SLIP (SYRINGE) IMPLANT
SYR 5ML LL (SYRINGE) IMPLANT
SYR TB 1ML LUER SLIP (SYRINGE) ×1 IMPLANT
SYRINGE 10CC LL (SYRINGE) ×1 IMPLANT
TOWEL OR 17X24 6PK STRL BLUE (TOWEL DISPOSABLE) ×4 IMPLANT
WATER STERILE IRR 1000ML POUR (IV SOLUTION) ×2 IMPLANT

## 2011-09-24 NOTE — Brief Op Note (Signed)
09/24/2011  12:20 PM  PATIENT:  [name]  76 y.o. male  PRE-OPERATIVE DIAGNOSIS:  MACULOR HOLE LEFT SIDE  POST-OPERATIVE DIAGNOSIS:  SAME, CHRONIC  PROCEDURE:  Procedure(s): PARS PLANA VITRECTOMY WITH 25 GAUGE, MEMBRANE PEEL 25 G+, INTERNAL LIMITING MEMBRANE, WITH INJECTION C3F8 8%     SURGEON:  Surgeon(s): Lyncoln Ledgerwood A Abdimalik Mayorquin  PHYSICIAN ASSISTANT: NONE  ASSISTANTS: none   ANESTHESIA:   IV sedation, MAC   EBL:  Total I/O In: 400 [I.V.:400] Out: -   BLOOD ADMINISTERED:none  DRAINS: none   LOCAL MEDICATIONS USED:  XYLOCAINE 10CC  SPECIMEN:  No Specimen  DISPOSITION OF SPECIMEN:  N/A  COUNTS:  YES  TOURNIQUET:  * No tourniquets in log *  DICTATION: .Other Dictation: Dictation Number [id number] ?  PLAN OF CARE: Discharge to home after PACU  PATIENT DISPOSITION:  PACU - hemodynamically stable.   Delay start of Pharmacological VTE agent (>24hrs) due to surgical blood loss or risk of bleeding:  {YES/NO/NOT APPLICABLE:20182

## 2011-09-24 NOTE — Anesthesia Preprocedure Evaluation (Signed)
Anesthesia Evaluation  Patient identified by MRN, date of birth, ID band Patient awake    Airway Mallampati: II      Dental   Pulmonary shortness of breath and with exertion, pneumonia ,  clear to auscultation        Cardiovascular hypertension, + CAD and + Past MI + dysrhythmias Regular Normal    Neuro/Psych    GI/Hepatic GERD-  ,  Endo/Other  Diabetes mellitus-, Well Controlled  Renal/GU      Musculoskeletal negative musculoskeletal ROS (+)   Abdominal   Peds  Hematology negative hematology ROS (+)   Anesthesia Other Findings   Reproductive/Obstetrics                           Anesthesia Physical Anesthesia Plan  ASA: III  Anesthesia Plan: MAC   Post-op Pain Management:    Induction: Intravenous  Airway Management Planned: Mask  Additional Equipment:   Intra-op Plan:   Post-operative Plan:   Informed Consent: I have reviewed the patients History and Physical, chart, labs and discussed the procedure including the risks, benefits and alternatives for the proposed anesthesia with the patient or authorized representative who has indicated his/her understanding and acceptance.     Plan Discussed with: CRNA  Anesthesia Plan Comments:         Anesthesia Quick Evaluation

## 2011-09-24 NOTE — Progress Notes (Signed)
Lindsey at Dr Luciana Axe office noted follow up call needed today to verify pt F/U appt.  Attempted to contact MD via cell phone.

## 2011-09-24 NOTE — OR Nursing (Signed)
Late entry: C3F8 alert bracelet put on patient's right wrist by PACU, RN postop. Also given safety information card.

## 2011-09-24 NOTE — Anesthesia Postprocedure Evaluation (Signed)
  Anesthesia Post-op Note  Patient: [name]  Procedure(s) Performed:  PARS PLANA VITRECTOMY WITH 25 GAUGE - MEMBRANE PEEL, SILICONE OIL  Patient Location: PACU  Anesthesia Type: MAC  Level of Consciousness: awake  Airway and Oxygen Therapy: Patient Spontanous Breathing  Post-op Pain: mild  Post-op Assessment: Post-op Vital signs reviewed  Post-op Vital Signs: stable  Complications: No apparent anesthesia complications

## 2011-09-24 NOTE — Preoperative (Signed)
Beta Blockers   Reason not to administer Beta Blockers:Not Applicable 

## 2011-09-24 NOTE — Transfer of Care (Signed)
Immediate Anesthesia Transfer of Care Note  Patient: [name]  Procedure(s) Performed:  PARS PLANA VITRECTOMY WITH 25 GAUGE - MEMBRANE PEEL, SILICONE OIL  Patient Location: PACU  Anesthesia Type: MAC  Level of Consciousness: awake, alert  and oriented  Airway & Oxygen Therapy: Patient Spontanous Breathing and Patient connected to nasal cannula oxygen  Post-op Assessment: Report given to PACU RN, Post -op Vital signs reviewed and stable and Patient moving all extremities X 4  Post vital signs: Reviewed and stable  Complications: No apparent anesthesia complications

## 2011-09-25 NOTE — Op Note (Signed)
[name], [name]                ACCOUNT NO.:  [id number]  MEDICAL RECORD NO.:  [id number]  LOCATION:  [facility]                         FACILITY:  [facility]  PHYSICIAN:  Jillyn Hidden A. Rankin, M.D.   DATE OF BIRTH:  16-Apr-1935  DATE OF PROCEDURE:  09/24/2011 DATE OF DISCHARGE:  09/24/2011                              OPERATIVE REPORT   PREOPERATIVE DIAGNOSIS:  Macular holes, stage III, chronic, status post vitrectomy and membrane peel with gas injection and poor postoperative positioning and compliance.  POSTOPERATIVE DIAGNOSIS:  Macular holes, stage III, chronic, status post vitrectomy and membrane peel with gas injection and poor postoperative positioning and compliance.  SURGEON:  Alford Highland. Rankin, MD  ANESTHESIA:  Local retrobulbar with monitored anesthesia control (Xylocaine 10 mL, 5 mL retrobulbar).  INDICATION FOR PROCEDURE:  The patient is a 76 year old man who has profound visual loss on the left eye on the basis of large kind of macular hole status post failed internal limiting membrane peel and gas injection.  The patient understands this is an attempt to look for any residual internal limiting membrane which might be contributing the hole staying open.  He understands the risks of anesthesia, occurrence of death, loss of the eye including but not limited to hemorrhage, infection, scarring, need for another surgery, change in vision, loss of vision, and progressive disease despite intervention.  He understands possible need for silicone oil.  Appropriate signed consent was obtained.  The patient was taken to the operating room.  In the operating room, appropriate monitors were followed by mild sedation.  Appropriate site selection was confirmed with the operative staff, the left eye . Thereafter, under mild sedation, 2% Xylocaine 5 mL was injected retrobulbar with additional 5 mL laterally in fashion of modified Darel Hong.  The right periocular region was sterilely prepped and  draped in the usual sterile fashion.  Lid speculum was applied.  A 25-gauge trocar was placed in the inferotemporal quadrant.  Infusion was verified in the vitreous cavity, infusion was turned on.  Superior trocar was applied. Core vitrectomy had been previously done which spaced in feather shade. At this time, fluid exchange was completed.  A dilute solution of ICG injected over the macular region and this did in fact confirmed that there was a wide area of ILM dissection over 1 disc diameter in all meridians and nearly 2 disc diameters inferiorly, nasally, and superiorly.  The residual internal limiting membrane temporarily was further engaged with 25 gauge forceps.  This was removed under fluid.  Thereafter, fluid exchange completed.  Using a flexible tip cannula, the edges of the macular home were confirmed to be fairly mobile.  There was no atrophy at the base of the macular hole at the time of surgery.  Small microscopic amounts of dark blood occurred and developed in the macular region which were intentionally left in place.  At this time, fluid exchange completed.  Under air, a diluted solution of C3F8 8% was selected.  Because of the patient's overall condition, I elected not to put him on silicone oil because it would require another surgery and with poor visual acuity recovery even with hole closures. This would avoid the  need for another surgery, but also if his current macular holes stayed open he would still have similar vision and not need another surgery with gas.  Long-acting gas, I selected C3F8 8% would obviate and prevent the need for face down positioning.  At this time, superior trocars had been removed as the air-C3F8 was exchanged completely.  At this time, all the remaining infusion was then removed.  Subconjunctival Decadron was applied.  Sterile patch and fox shield were applied.  The patient tolerated the procedure without complications, taken to  PACU.     Alford Highland Rankin, M.D.     GAR/MEDQ  D:  09/24/2011  T:  09/25/2011  Job:  [id number]

## 2011-09-30 ENCOUNTER — Encounter (HOSPITAL_COMMUNITY): Payer: Self-pay | Admitting: Ophthalmology

## 2011-10-12 DIAGNOSIS — E039 Hypothyroidism, unspecified: Secondary | ICD-10-CM | POA: Diagnosis not present

## 2011-10-12 DIAGNOSIS — I2510 Atherosclerotic heart disease of native coronary artery without angina pectoris: Secondary | ICD-10-CM | POA: Diagnosis not present

## 2011-10-12 DIAGNOSIS — E119 Type 2 diabetes mellitus without complications: Secondary | ICD-10-CM | POA: Diagnosis not present

## 2011-10-12 DIAGNOSIS — I4891 Unspecified atrial fibrillation: Secondary | ICD-10-CM | POA: Diagnosis not present

## 2011-10-12 DIAGNOSIS — E785 Hyperlipidemia, unspecified: Secondary | ICD-10-CM | POA: Diagnosis not present

## 2011-10-12 DIAGNOSIS — I509 Heart failure, unspecified: Secondary | ICD-10-CM | POA: Diagnosis not present

## 2011-10-12 DIAGNOSIS — D518 Other vitamin B12 deficiency anemias: Secondary | ICD-10-CM | POA: Diagnosis not present

## 2011-10-12 DIAGNOSIS — Z125 Encounter for screening for malignant neoplasm of prostate: Secondary | ICD-10-CM | POA: Diagnosis not present

## 2011-10-21 DIAGNOSIS — D518 Other vitamin B12 deficiency anemias: Secondary | ICD-10-CM | POA: Diagnosis not present

## 2011-10-22 ENCOUNTER — Ambulatory Visit (INDEPENDENT_AMBULATORY_CARE_PROVIDER_SITE_OTHER): Payer: Medicare Other | Admitting: *Deleted

## 2011-10-22 DIAGNOSIS — Z7901 Long term (current) use of anticoagulants: Secondary | ICD-10-CM

## 2011-10-22 DIAGNOSIS — I4891 Unspecified atrial fibrillation: Secondary | ICD-10-CM | POA: Diagnosis not present

## 2011-10-22 LAB — POCT INR: INR: 2.3

## 2011-11-01 ENCOUNTER — Other Ambulatory Visit: Payer: Self-pay | Admitting: *Deleted

## 2011-11-01 MED ORDER — ATORVASTATIN CALCIUM 10 MG PO TABS: 10.0000 mg | ORAL_TABLET | Freq: Every day | ORAL | Status: DC

## 2011-11-02 DIAGNOSIS — D518 Other vitamin B12 deficiency anemias: Secondary | ICD-10-CM | POA: Diagnosis not present

## 2011-11-09 DIAGNOSIS — D518 Other vitamin B12 deficiency anemias: Secondary | ICD-10-CM | POA: Diagnosis not present

## 2011-12-03 ENCOUNTER — Ambulatory Visit (INDEPENDENT_AMBULATORY_CARE_PROVIDER_SITE_OTHER): Payer: Medicare Other | Admitting: *Deleted

## 2011-12-03 DIAGNOSIS — Z7901 Long term (current) use of anticoagulants: Secondary | ICD-10-CM

## 2011-12-03 DIAGNOSIS — I4891 Unspecified atrial fibrillation: Secondary | ICD-10-CM

## 2011-12-25 ENCOUNTER — Other Ambulatory Visit: Payer: Self-pay | Admitting: Cardiology

## 2011-12-30 DIAGNOSIS — H43819 Vitreous degeneration, unspecified eye: Secondary | ICD-10-CM | POA: Diagnosis not present

## 2011-12-30 DIAGNOSIS — H35359 Cystoid macular degeneration, unspecified eye: Secondary | ICD-10-CM | POA: Diagnosis not present

## 2011-12-30 DIAGNOSIS — H43829 Vitreomacular adhesion, unspecified eye: Secondary | ICD-10-CM | POA: Diagnosis not present

## 2011-12-30 DIAGNOSIS — H3570 Unspecified separation of retinal layers: Secondary | ICD-10-CM | POA: Diagnosis not present

## 2011-12-30 DIAGNOSIS — H35349 Macular cyst, hole, or pseudohole, unspecified eye: Secondary | ICD-10-CM | POA: Diagnosis not present

## 2012-01-05 DIAGNOSIS — D518 Other vitamin B12 deficiency anemias: Secondary | ICD-10-CM | POA: Diagnosis not present

## 2012-01-14 ENCOUNTER — Ambulatory Visit (INDEPENDENT_AMBULATORY_CARE_PROVIDER_SITE_OTHER): Payer: Medicare Other | Admitting: *Deleted

## 2012-01-14 ENCOUNTER — Other Ambulatory Visit: Payer: Self-pay | Admitting: Cardiology

## 2012-01-14 DIAGNOSIS — I4891 Unspecified atrial fibrillation: Secondary | ICD-10-CM

## 2012-01-14 DIAGNOSIS — Z7901 Long term (current) use of anticoagulants: Secondary | ICD-10-CM

## 2012-01-14 LAB — POCT INR: INR: 2.3

## 2012-01-15 ENCOUNTER — Telehealth: Payer: Self-pay | Admitting: Physician Assistant

## 2012-01-15 NOTE — Telephone Encounter (Signed)
Patient's wife called the answering service re: husband's Amiodarone. Has been out for about a week. They have been called pharmacy for refills. Needs to be called in. Patient otherwise asymptomatic. Encouraged to make follow-up appointment in May as he has not been seen in the office since 07/12. Will call in Amiodarone prescription. They understood and agreed.    Jacqulyn Bath, PA-C 01/15/2012 10:08 AM

## 2012-01-17 MED ORDER — AMIODARONE HCL 200 MG PO TABS: 200.0000 mg | ORAL_TABLET | Freq: Every day | ORAL | Status: DC

## 2012-01-17 NOTE — Telephone Encounter (Signed)
..   Requested Prescriptions   Pending Prescriptions Disp Refills  . amiodarone (PACERONE) 200 MG tablet 90 tablet 0    Sig: Take 1 tablet (200 mg total) by mouth daily.   

## 2012-01-17 NOTE — Telephone Encounter (Signed)
..   Requested Prescriptions   Pending Prescriptions Disp Refills  . amiodarone (PACERONE) 200 MG tablet 90 tablet 0    Sig: Take 1 tablet (200 mg total) by mouth daily.

## 2012-02-02 ENCOUNTER — Ambulatory Visit (INDEPENDENT_AMBULATORY_CARE_PROVIDER_SITE_OTHER): Payer: Medicare Other | Admitting: Cardiology

## 2012-02-02 ENCOUNTER — Encounter: Payer: Self-pay | Admitting: Cardiology

## 2012-02-02 DIAGNOSIS — I4891 Unspecified atrial fibrillation: Secondary | ICD-10-CM

## 2012-02-02 DIAGNOSIS — I428 Other cardiomyopathies: Secondary | ICD-10-CM | POA: Diagnosis not present

## 2012-02-02 DIAGNOSIS — I2589 Other forms of chronic ischemic heart disease: Secondary | ICD-10-CM

## 2012-02-02 DIAGNOSIS — I10 Essential (primary) hypertension: Secondary | ICD-10-CM

## 2012-02-02 DIAGNOSIS — I2510 Atherosclerotic heart disease of native coronary artery without angina pectoris: Secondary | ICD-10-CM

## 2012-02-02 NOTE — Assessment & Plan Note (Signed)
He seems to be maintaining sinus rhythm and tolerating anticoagulation. I make no change in his therapy.  I will check a TSH.

## 2012-02-02 NOTE — Assessment & Plan Note (Signed)
I will reassess his EF with an echocardiogram.  He will for now continue the meds as listed.  He has not wanted to consider reimplantation of his ICD.

## 2012-02-02 NOTE — Assessment & Plan Note (Signed)
The blood pressure is at target. No change in medications is indicated. We will continue with therapeutic lifestyle changes (TLC).  

## 2012-02-02 NOTE — Progress Notes (Signed)
HPI The patient presents for follow up of CAD and ischemic cardiomyopathy.  Since I last saw him he has had no new cardiovascular complaints. He's been bothered mostly by constipation and urinary retention. He denies any new shortness of breath, PND or orthopnea. He has had no new palpitations, presyncope or syncope. He does some activities such as pushing a lawnmower. Of note in the past he did not tolerate up titration of his medications and is resistant to this.  No Known Allergies  Current Outpatient Prescriptions  Medication Sig Dispense Refill  . ALPRAZolam (XANAX) 0.5 MG tablet Take 0.5 mg by mouth 3 (three) times daily as needed. For anxiety.      Marland Kitchen amiodarone (PACERONE) 200 MG tablet Take 200 mg by mouth daily. 1/2 tab daily      . aspirin 81 MG tablet Take 81 mg by mouth daily.        Marland Kitchen atorvastatin (LIPITOR) 10 MG tablet Take 1 tablet (10 mg total) by mouth daily.  30 tablet  6  . escitalopram (LEXAPRO) 10 MG tablet Take 10 mg by mouth daily.        . finasteride (PROSCAR) 5 MG tablet Take 5 mg by mouth daily.        Marland Kitchen levothyroxine (SYNTHROID, LEVOTHROID) 25 MCG tablet TAKE ONE TABLET BY MOUTH EVERY DAY  30 tablet  6  . losartan (COZAAR) 50 MG tablet Take 1 tablet (50 mg total) by mouth daily.  30 tablet  11  . saw palmetto 160 MG capsule Take 160 mg by mouth daily.       . sitaGLIPtin (JANUVIA) 50 MG tablet Take 25 mg by mouth daily.        Marland Kitchen warfarin (COUMADIN) 5 MG tablet Take as directed by [facility]  30 each  2  . DISCONTD: amiodarone (PACERONE) 200 MG tablet Take 1 tablet (200 mg total) by mouth daily.  90 tablet  0  . DISCONTD: warfarin (COUMADIN) 5 MG tablet Take 5 mg by mouth as directed. Takes 5mg  on Monday and Thursday. All other days pt takes 2.5 mg (1/2 tablet)         Past Medical History  Diagnosis Date  . Chest discomfort   . Low back pain   . Ischemic cardiomyopathy     EF 23%  . Hearing loss   . SOB (shortness of breath)   . Joint pain   . VT  (ventricular tachycardia)     s/p ICD explanted for infection.  The decision has been not to reimplant this given these difficulties.  . Anxiety   . Depression   . PAF (paroxysmal atrial fibrillation)   . Hyperlipidemia   . Gynecomastia   . BPH (benign prostatic hypertrophy)   . Diabetes mellitus   . Hypertension   . Degenerative joint disease   . GERD (gastroesophageal reflux disease)   . CAD (coronary artery disease)   . Pneumonia     Past Surgical History  Procedure Date  . Cardiac defibrillator removal   . Coronary artery bypass graft 2000    X5  . Cardiac catheterization 12/25/2008    THE LEFT VENTRICLE WAS ENLARGED. THERE IS ANTERIOR APICAL AND INFERIOR APICAL AKINESIA. EF ESTIMATED 20%. NO MITRAL REGURGITATION.  . Cardiac catheterization 12/2008    REPEAT CATH, SHOWED BYPASS GRAFTS WERE PATENT  . US echocardiography 2007    EF 20 to 25%  . Ablation of dysrhythmic focus   . Pars plana vitrectomy 09/24/2011  Procedure: PARS PLANA VITRECTOMY WITH 25 GAUGE;  Surgeon: Alford Highland Rankin;  Location: [facility] OR;  Service: Ophthalmology;  Laterality: Left;  MEMBRANE PEEL, SILICONE OIL    ROS: As stated in the HPI and negative for all other systems.  PHYSICAL EXAM BP 140/80  Pulse 58  Ht 5\' 7"  (1.702 m)  Wt 207 lb 12.8 oz (94.257 kg)  BMI 32.55 kg/m2 GENERAL:  Well appearing HEENT:  Pupils equal round and reactive, fundi not visualized, oral mucosa unremarkable NECK:  No jugular venous distention, waveform within normal limits, carotid upstroke brisk and symmetric, no bruits, no thyromegaly LYMPHATICS:  No cervical, inguinal adenopathy LUNGS:  Clear to auscultation bilaterally BACK:  No CVA tenderness CHEST:  Well healed sternotomy scar, ICD scar healed. HEART:  PMI not displaced or sustained,S1 and S2 within normal limits, no S3, no S4, no clicks, no rubs, no murmurs ABD:  Flat, positive bowel sounds normal in frequency in pitch, no bruits, no rebound, no guarding, no midline  pulsatile mass, no hepatomegaly, no splenomegaly EXT:  2 plus pulses throughout, no edema, no cyanosis no clubbing  EKG:  Sinus rhythm rate 58 with first degree AV block, IVCD, old inferior infarct, anterolateral infarct.  No change from previous.  02/02/2012  ASSESSMENT AND PLAN

## 2012-02-02 NOTE — Patient Instructions (Addendum)
The current medical regimen is effective;  continue present plan and medications.  Please have blood work today (TSH)  Your physician has requested that you have an echocardiogram. Echocardiography is a painless test that uses sound waves to create images of your heart. It provides your doctor with information about the size and shape of your heart and how well your heart's chambers and valves are working. This procedure takes approximately one hour. There are no restrictions for this procedure.  Follow up in 6 months with Dr Antoine Poche.  You will receive a letter in the mail 2 months before you are due.  Please call us when you receive this letter to schedule your follow up appointment.

## 2012-02-02 NOTE — Assessment & Plan Note (Signed)
The patient has no new sypmtoms.  No further cardiovascular testing is indicated.  We will continue with aggressive risk reduction and meds as listed.  

## 2012-02-09 ENCOUNTER — Other Ambulatory Visit: Payer: Self-pay | Admitting: *Deleted

## 2012-02-09 DIAGNOSIS — R7989 Other specified abnormal findings of blood chemistry: Secondary | ICD-10-CM

## 2012-02-09 DIAGNOSIS — E039 Hypothyroidism, unspecified: Secondary | ICD-10-CM

## 2012-02-10 ENCOUNTER — Other Ambulatory Visit: Payer: Medicare Other

## 2012-02-10 ENCOUNTER — Other Ambulatory Visit: Payer: Self-pay | Admitting: *Deleted

## 2012-02-10 ENCOUNTER — Encounter: Payer: Self-pay | Admitting: Neurology

## 2012-02-10 DIAGNOSIS — R6889 Other general symptoms and signs: Secondary | ICD-10-CM | POA: Diagnosis not present

## 2012-02-10 DIAGNOSIS — R000 Tachycardia, unspecified: Secondary | ICD-10-CM | POA: Diagnosis not present

## 2012-02-10 DIAGNOSIS — I2510 Atherosclerotic heart disease of native coronary artery without angina pectoris: Secondary | ICD-10-CM | POA: Diagnosis not present

## 2012-02-10 DIAGNOSIS — E039 Hypothyroidism, unspecified: Secondary | ICD-10-CM

## 2012-02-10 DIAGNOSIS — R7989 Other specified abnormal findings of blood chemistry: Secondary | ICD-10-CM

## 2012-02-10 DIAGNOSIS — I4891 Unspecified atrial fibrillation: Secondary | ICD-10-CM

## 2012-02-10 DIAGNOSIS — I10 Essential (primary) hypertension: Secondary | ICD-10-CM | POA: Diagnosis not present

## 2012-02-10 DIAGNOSIS — I509 Heart failure, unspecified: Secondary | ICD-10-CM | POA: Diagnosis not present

## 2012-02-10 DIAGNOSIS — D518 Other vitamin B12 deficiency anemias: Secondary | ICD-10-CM | POA: Diagnosis not present

## 2012-02-10 DIAGNOSIS — E119 Type 2 diabetes mellitus without complications: Secondary | ICD-10-CM | POA: Diagnosis not present

## 2012-02-10 DIAGNOSIS — R259 Unspecified abnormal involuntary movements: Secondary | ICD-10-CM | POA: Diagnosis not present

## 2012-02-16 ENCOUNTER — Other Ambulatory Visit (INDEPENDENT_AMBULATORY_CARE_PROVIDER_SITE_OTHER): Payer: Medicare Other

## 2012-02-16 ENCOUNTER — Other Ambulatory Visit: Payer: Self-pay

## 2012-02-16 DIAGNOSIS — I4891 Unspecified atrial fibrillation: Secondary | ICD-10-CM | POA: Diagnosis not present

## 2012-02-16 DIAGNOSIS — I2510 Atherosclerotic heart disease of native coronary artery without angina pectoris: Secondary | ICD-10-CM

## 2012-02-16 DIAGNOSIS — I428 Other cardiomyopathies: Secondary | ICD-10-CM

## 2012-03-03 ENCOUNTER — Ambulatory Visit (INDEPENDENT_AMBULATORY_CARE_PROVIDER_SITE_OTHER): Payer: Medicare Other | Admitting: *Deleted

## 2012-03-03 DIAGNOSIS — Z7901 Long term (current) use of anticoagulants: Secondary | ICD-10-CM | POA: Diagnosis not present

## 2012-03-03 DIAGNOSIS — I4891 Unspecified atrial fibrillation: Secondary | ICD-10-CM

## 2012-03-03 LAB — POCT INR: INR: 2.3

## 2012-03-08 ENCOUNTER — Other Ambulatory Visit: Payer: Self-pay | Admitting: Cardiology

## 2012-03-08 ENCOUNTER — Other Ambulatory Visit: Payer: Self-pay | Admitting: Physician Assistant

## 2012-03-08 NOTE — Telephone Encounter (Signed)
Fax Received. Refill Completed. Mckoy Bhakta Chowoe (R.M.A)   

## 2012-03-08 NOTE — Telephone Encounter (Signed)
..   Requested Prescriptions   Pending Prescriptions Disp Refills  . levothyroxine (SYNTHROID, LEVOTHROID) 25 MCG tablet [Pharmacy Med Name: LEVOTHYROXIN  TAB MYLA] 90 tablet 3    Sig: TAKE ONE TABLET BY MOUTH EVERY DAY

## 2012-03-10 ENCOUNTER — Other Ambulatory Visit: Payer: Self-pay | Admitting: Cardiology

## 2012-03-10 DIAGNOSIS — Z79899 Other long term (current) drug therapy: Secondary | ICD-10-CM

## 2012-03-10 MED ORDER — LOSARTAN POTASSIUM 50 MG PO TABS: 50.0000 mg | ORAL_TABLET | Freq: Every day | ORAL | Status: DC

## 2012-03-14 ENCOUNTER — Other Ambulatory Visit: Payer: Self-pay | Admitting: Cardiology

## 2012-03-21 DIAGNOSIS — Z961 Presence of intraocular lens: Secondary | ICD-10-CM | POA: Diagnosis not present

## 2012-03-21 DIAGNOSIS — H35349 Macular cyst, hole, or pseudohole, unspecified eye: Secondary | ICD-10-CM | POA: Diagnosis not present

## 2012-03-21 DIAGNOSIS — E119 Type 2 diabetes mellitus without complications: Secondary | ICD-10-CM | POA: Diagnosis not present

## 2012-03-23 DIAGNOSIS — D518 Other vitamin B12 deficiency anemias: Secondary | ICD-10-CM | POA: Diagnosis not present

## 2012-04-13 ENCOUNTER — Ambulatory Visit: Payer: Medicare Other | Admitting: Neurology

## 2012-04-14 ENCOUNTER — Ambulatory Visit (INDEPENDENT_AMBULATORY_CARE_PROVIDER_SITE_OTHER): Payer: Medicare Other | Admitting: *Deleted

## 2012-04-14 DIAGNOSIS — I4891 Unspecified atrial fibrillation: Secondary | ICD-10-CM

## 2012-04-14 DIAGNOSIS — Z7901 Long term (current) use of anticoagulants: Secondary | ICD-10-CM

## 2012-04-14 LAB — POCT INR: INR: 2.3

## 2012-04-27 DIAGNOSIS — D518 Other vitamin B12 deficiency anemias: Secondary | ICD-10-CM | POA: Diagnosis not present

## 2012-05-26 ENCOUNTER — Ambulatory Visit (INDEPENDENT_AMBULATORY_CARE_PROVIDER_SITE_OTHER): Payer: Medicare Other | Admitting: *Deleted

## 2012-05-26 DIAGNOSIS — Z7901 Long term (current) use of anticoagulants: Secondary | ICD-10-CM

## 2012-05-26 DIAGNOSIS — I4891 Unspecified atrial fibrillation: Secondary | ICD-10-CM | POA: Diagnosis not present

## 2012-05-29 ENCOUNTER — Other Ambulatory Visit: Payer: Self-pay | Admitting: Cardiology

## 2012-05-29 NOTE — Telephone Encounter (Signed)
..   Requested Prescriptions   Pending Prescriptions Disp Refills  . atorvastatin (LIPITOR) 10 MG tablet [Pharmacy Med Name: ATORVASTATIN 10 MG  TAB KREM] 30 tablet 6    Sig: TAKE 1 TABLET (10 MG TOTAL) BY MOUTH DAILY.

## 2012-06-09 ENCOUNTER — Encounter (INDEPENDENT_AMBULATORY_CARE_PROVIDER_SITE_OTHER): Payer: Medicare Other | Admitting: Ophthalmology

## 2012-06-09 DIAGNOSIS — H35349 Macular cyst, hole, or pseudohole, unspecified eye: Secondary | ICD-10-CM | POA: Diagnosis not present

## 2012-06-09 DIAGNOSIS — H43819 Vitreous degeneration, unspecified eye: Secondary | ICD-10-CM

## 2012-06-09 DIAGNOSIS — I10 Essential (primary) hypertension: Secondary | ICD-10-CM | POA: Diagnosis not present

## 2012-06-09 DIAGNOSIS — H35039 Hypertensive retinopathy, unspecified eye: Secondary | ICD-10-CM | POA: Diagnosis not present

## 2012-06-09 DIAGNOSIS — E1139 Type 2 diabetes mellitus with other diabetic ophthalmic complication: Secondary | ICD-10-CM

## 2012-06-09 DIAGNOSIS — E1165 Type 2 diabetes mellitus with hyperglycemia: Secondary | ICD-10-CM | POA: Diagnosis not present

## 2012-06-15 DIAGNOSIS — I10 Essential (primary) hypertension: Secondary | ICD-10-CM | POA: Diagnosis not present

## 2012-06-15 DIAGNOSIS — Z23 Encounter for immunization: Secondary | ICD-10-CM | POA: Diagnosis not present

## 2012-06-15 DIAGNOSIS — I509 Heart failure, unspecified: Secondary | ICD-10-CM | POA: Diagnosis not present

## 2012-06-15 DIAGNOSIS — E559 Vitamin D deficiency, unspecified: Secondary | ICD-10-CM | POA: Diagnosis not present

## 2012-06-15 DIAGNOSIS — E119 Type 2 diabetes mellitus without complications: Secondary | ICD-10-CM | POA: Diagnosis not present

## 2012-06-15 DIAGNOSIS — R5381 Other malaise: Secondary | ICD-10-CM | POA: Diagnosis not present

## 2012-06-15 DIAGNOSIS — D649 Anemia, unspecified: Secondary | ICD-10-CM | POA: Diagnosis not present

## 2012-06-15 DIAGNOSIS — D518 Other vitamin B12 deficiency anemias: Secondary | ICD-10-CM | POA: Diagnosis not present

## 2012-06-23 ENCOUNTER — Ambulatory Visit (INDEPENDENT_AMBULATORY_CARE_PROVIDER_SITE_OTHER): Payer: Medicare Other | Admitting: *Deleted

## 2012-06-23 DIAGNOSIS — Z7901 Long term (current) use of anticoagulants: Secondary | ICD-10-CM | POA: Diagnosis not present

## 2012-06-23 DIAGNOSIS — I4891 Unspecified atrial fibrillation: Secondary | ICD-10-CM | POA: Diagnosis not present

## 2012-06-23 LAB — POCT INR: INR: 2.4

## 2012-07-21 ENCOUNTER — Ambulatory Visit (INDEPENDENT_AMBULATORY_CARE_PROVIDER_SITE_OTHER): Payer: Medicare Other | Admitting: *Deleted

## 2012-07-21 DIAGNOSIS — Z7901 Long term (current) use of anticoagulants: Secondary | ICD-10-CM

## 2012-07-21 DIAGNOSIS — I4891 Unspecified atrial fibrillation: Secondary | ICD-10-CM | POA: Diagnosis not present

## 2012-07-21 LAB — POCT INR: INR: 2.1

## 2012-08-01 DIAGNOSIS — K921 Melena: Secondary | ICD-10-CM | POA: Diagnosis not present

## 2012-08-01 DIAGNOSIS — D518 Other vitamin B12 deficiency anemias: Secondary | ICD-10-CM | POA: Diagnosis not present

## 2012-08-01 DIAGNOSIS — I10 Essential (primary) hypertension: Secondary | ICD-10-CM | POA: Diagnosis not present

## 2012-08-01 DIAGNOSIS — E039 Hypothyroidism, unspecified: Secondary | ICD-10-CM | POA: Diagnosis not present

## 2012-08-01 DIAGNOSIS — E119 Type 2 diabetes mellitus without complications: Secondary | ICD-10-CM | POA: Diagnosis not present

## 2012-08-01 DIAGNOSIS — I4891 Unspecified atrial fibrillation: Secondary | ICD-10-CM | POA: Diagnosis not present

## 2012-08-01 DIAGNOSIS — R259 Unspecified abnormal involuntary movements: Secondary | ICD-10-CM | POA: Diagnosis not present

## 2012-08-01 DIAGNOSIS — I509 Heart failure, unspecified: Secondary | ICD-10-CM | POA: Diagnosis not present

## 2012-08-18 ENCOUNTER — Ambulatory Visit (INDEPENDENT_AMBULATORY_CARE_PROVIDER_SITE_OTHER): Payer: Medicare Other | Admitting: *Deleted

## 2012-08-18 DIAGNOSIS — I4891 Unspecified atrial fibrillation: Secondary | ICD-10-CM

## 2012-08-18 DIAGNOSIS — Z7901 Long term (current) use of anticoagulants: Secondary | ICD-10-CM

## 2012-08-29 ENCOUNTER — Other Ambulatory Visit: Payer: Self-pay | Admitting: Cardiology

## 2012-09-04 ENCOUNTER — Encounter: Payer: Self-pay | Admitting: Cardiology

## 2012-09-04 ENCOUNTER — Other Ambulatory Visit: Payer: Self-pay | Admitting: Cardiology

## 2012-09-04 ENCOUNTER — Ambulatory Visit (INDEPENDENT_AMBULATORY_CARE_PROVIDER_SITE_OTHER): Payer: Medicare Other | Admitting: Cardiology

## 2012-09-04 ENCOUNTER — Encounter (INDEPENDENT_AMBULATORY_CARE_PROVIDER_SITE_OTHER): Payer: Medicare Other

## 2012-09-04 VITALS — BP 119/82 | HR 106 | Ht 70.0 in | Wt 208.8 lb

## 2012-09-04 DIAGNOSIS — I2589 Other forms of chronic ischemic heart disease: Secondary | ICD-10-CM

## 2012-09-04 DIAGNOSIS — I2510 Atherosclerotic heart disease of native coronary artery without angina pectoris: Secondary | ICD-10-CM

## 2012-09-04 DIAGNOSIS — R0989 Other specified symptoms and signs involving the circulatory and respiratory systems: Secondary | ICD-10-CM

## 2012-09-04 DIAGNOSIS — I4891 Unspecified atrial fibrillation: Secondary | ICD-10-CM | POA: Diagnosis not present

## 2012-09-04 DIAGNOSIS — I4892 Unspecified atrial flutter: Secondary | ICD-10-CM | POA: Diagnosis not present

## 2012-09-04 DIAGNOSIS — I10 Essential (primary) hypertension: Secondary | ICD-10-CM

## 2012-09-04 NOTE — Progress Notes (Signed)
Patient ID: [name], male   DOB: [birth year], 77 y.o.   MRN: [id number] Placed a 24 hour monitor while here for office visit and also went over all instructions on when to bring back monitor also went over with wife

## 2012-09-04 NOTE — Patient Instructions (Addendum)
Please hold your Lipitor for 6 weeks. Continue all other medications as listed  Your physician has recommended that you wear a holter monitor for 24 hours. Holter monitors are medical devices that record the heart's electrical activity. Doctors most often use these monitors to diagnose arrhythmias. Arrhythmias are problems with the speed or rhythm of the heartbeat. The monitor is a small, portable device. You can wear one while you do your normal daily activities. This is usually used to diagnose what is causing palpitations/syncope (passing out).  Follow up with Dr Antoine Poche in 1 month.

## 2012-09-04 NOTE — Progress Notes (Signed)
HPI The patient presents for follow up of CAD and ischemic cardiomyopathy.  Since I last saw him he has had continued complaints of joint pains and muscle aches. He's bothered by anxiety and sharp. He has been feeling palpitations and he is in fibrillation today. He says he thinks he's been noticing this for 2 months. He hasn't had any presyncope or syncope. He has chronic complaints of weakness and it sounds as if this has changed. He's not had any new shortness of breath, PND or orthopnea. He's not describing any PND or orthopnea. He does have symptoms of heartburn which do not seem similar to his previous angina.  Of note in the past he did not tolerate up titration of his medications and is resistant to this for management of his cardiomyopathy.  No Known Allergies  Current Outpatient Prescriptions  Medication Sig Dispense Refill  . ALPRAZolam (XANAX) 0.5 MG tablet Take 0.5 mg by mouth 3 (three) times daily as needed. For anxiety.      Marland Kitchen aspirin 81 MG tablet Take 81 mg by mouth daily.        Marland Kitchen atorvastatin (LIPITOR) 10 MG tablet TAKE 1 TABLET (10 MG TOTAL) BY MOUTH DAILY.  30 tablet  6  . COUMADIN 5 MG tablet TAKE AS DIRECTED BY COUMADIN  CLINIC  30 tablet  3  . escitalopram (LEXAPRO) 10 MG tablet Take 10 mg by mouth daily.        . finasteride (PROSCAR) 5 MG tablet Take 5 mg by mouth daily.        Marland Kitchen levothyroxine (SYNTHROID, LEVOTHROID) 25 MCG tablet TAKE ONE TABLET BY MOUTH EVERY DAY  90 tablet  3  . losartan (COZAAR) 50 MG tablet Take 1 tablet (50 mg total) by mouth daily.  30 tablet  11  . PACERONE 200 MG tablet TAKE ONE TABLET BY MOUTH DAILY ON MONDAY THROUGH FRIDAY  20 each  5  . saw palmetto 160 MG capsule Take 160 mg by mouth daily.       . sitaGLIPtin (JANUVIA) 50 MG tablet Take 25 mg by mouth daily.          Past Medical History  Diagnosis Date  . Chest discomfort   . Low back pain   . Ischemic cardiomyopathy     EF 23%  . Hearing loss   . SOB (shortness of breath)   .  Joint pain   . VT (ventricular tachycardia)     s/p ICD explanted for infection.  The decision has been not to reimplant this given these difficulties.  . Anxiety   . Depression   . PAF (paroxysmal atrial fibrillation)   . Hyperlipidemia   . Gynecomastia   . BPH (benign prostatic hypertrophy)   . Diabetes mellitus   . Hypertension   . Degenerative joint disease   . GERD (gastroesophageal reflux disease)   . CAD (coronary artery disease)   . Pneumonia     Past Surgical History  Procedure Date  . Cardiac defibrillator removal   . Coronary artery bypass graft 2000    X5  . Cardiac catheterization 12/25/2008    THE LEFT VENTRICLE WAS ENLARGED. THERE IS ANTERIOR APICAL AND INFERIOR APICAL AKINESIA. EF ESTIMATED 20%. NO MITRAL REGURGITATION.  . Cardiac catheterization 12/2008    REPEAT CATH, SHOWED BYPASS GRAFTS WERE PATENT  . US echocardiography 2007    EF 20 to 25%  . Ablation of dysrhythmic focus   . Pars plana vitrectomy 09/24/2011  Procedure: PARS PLANA VITRECTOMY WITH 25 GAUGE;  Surgeon: Alford Highland Rankin;  Location: [facility] OR;  Service: Ophthalmology;  Laterality: Left;  MEMBRANE PEEL, SILICONE OIL    ROS: Positive for joint pains, muscle aches, anxiety, tremor, constipation. Otherwise as stated in the HPI and negative for all other systems.  PHYSICAL EXAM BP 119/82  Pulse 106  Ht 5\' 10"  (1.778 m)  Wt 208 lb 12.8 oz (94.711 kg)  BMI 29.96 kg/m2 GENERAL:  Frail appearing HEENT:  Pupils equal round and reactive, fundi not visualized, oral mucosa unremarkable NECK:  No jugular venous distention, waveform within normal limits, carotid upstroke brisk and symmetric, no bruits, no thyromegaly LYMPHATICS:  No cervical, inguinal adenopathy LUNGS:  Clear to auscultation bilaterally BACK:  No CVA tenderness CHEST:  Well healed sternotomy scar, ICD scar healed. HEART:  PMI not displaced or sustained,S1 and S2 within normal limits, no S3, no clicks, no rubs, no murmurs, irregular ABD:   Flat, positive bowel sounds normal in frequency in pitch, no bruits, no rebound, no guarding, no midline pulsatile mass, no hepatomegaly, no splenomegaly EXT:  2 plus pulses throughout, no edema, no cyanosis no clubbing  EKG:  Atrial fibrillation rate 100 with first degree AV block, IVCD, old inferior infarct, anterolateral infarct.    09/04/2012  ASSESSMENT AND PLAN  CARDIOMYOPATHY, ISCHEMIC -  His EF was slightly better on his echo earlier this year. He seems to be euvolemic. He will for now continue the meds as listed. He has not wanted to consider reimplantation of his ICD.   Atrial fibrillation -  He is in atrial fibrillation and he thinks he notices this.  However, the rate seems to be controlled and he is tolerating anticoagulation. For now I am going to continue his amiodarone. I will check a 24-hour Holter to look for rate control.  CAD (coronary artery disease) -  The patient has no new sypmtoms. No further cardiovascular testing is indicated. We will continue with aggressive risk reduction and meds as listed.   HYPERTENSION, UNSPECIFIED -  The blood pressure is at target. No change in medications is indicated. We will continue with therapeutic lifestyle changes (TLC).   DYSLIPIDEMIA - He has such significant somatic complaints including muscle aches that I think it is reasonable to try him off of statin for 6 weeks to see if any of this improved.

## 2012-09-06 ENCOUNTER — Telehealth: Payer: Self-pay | Admitting: *Deleted

## 2012-09-06 NOTE — Telephone Encounter (Signed)
Called pt, ecardio 24 hr monitor didn't record, pt informed and told to expect a call to reschedule, pt agreed to plan.

## 2012-09-08 ENCOUNTER — Ambulatory Visit (INDEPENDENT_AMBULATORY_CARE_PROVIDER_SITE_OTHER): Payer: Medicare Other | Admitting: *Deleted

## 2012-09-08 DIAGNOSIS — Z7901 Long term (current) use of anticoagulants: Secondary | ICD-10-CM

## 2012-09-08 DIAGNOSIS — I4891 Unspecified atrial fibrillation: Secondary | ICD-10-CM | POA: Diagnosis not present

## 2012-09-20 ENCOUNTER — Telehealth: Payer: Self-pay | Admitting: *Deleted

## 2012-09-20 ENCOUNTER — Encounter (INDEPENDENT_AMBULATORY_CARE_PROVIDER_SITE_OTHER): Payer: Medicare Other

## 2012-09-20 DIAGNOSIS — I4892 Unspecified atrial flutter: Secondary | ICD-10-CM | POA: Diagnosis not present

## 2012-09-20 NOTE — Telephone Encounter (Signed)
24 Hr holter (re-do) placed on pt 09/20/12. TK

## 2012-09-30 ENCOUNTER — Other Ambulatory Visit: Payer: Self-pay | Admitting: Physician Assistant

## 2012-10-06 ENCOUNTER — Ambulatory Visit (INDEPENDENT_AMBULATORY_CARE_PROVIDER_SITE_OTHER): Payer: Medicare Other | Admitting: *Deleted

## 2012-10-06 ENCOUNTER — Encounter: Payer: Self-pay | Admitting: *Deleted

## 2012-10-06 DIAGNOSIS — Z7901 Long term (current) use of anticoagulants: Secondary | ICD-10-CM | POA: Diagnosis not present

## 2012-10-06 DIAGNOSIS — I4891 Unspecified atrial fibrillation: Secondary | ICD-10-CM | POA: Diagnosis not present

## 2012-10-06 LAB — POCT INR: INR: 1.7

## 2012-10-10 ENCOUNTER — Ambulatory Visit (INDEPENDENT_AMBULATORY_CARE_PROVIDER_SITE_OTHER): Payer: Medicare Other | Admitting: Cardiology

## 2012-10-10 ENCOUNTER — Encounter: Payer: Self-pay | Admitting: Cardiology

## 2012-10-10 VITALS — BP 115/65 | HR 95 | Ht 69.0 in | Wt 206.4 lb

## 2012-10-10 DIAGNOSIS — I5023 Acute on chronic systolic (congestive) heart failure: Secondary | ICD-10-CM

## 2012-10-10 DIAGNOSIS — I219 Acute myocardial infarction, unspecified: Secondary | ICD-10-CM

## 2012-10-10 DIAGNOSIS — I10 Essential (primary) hypertension: Secondary | ICD-10-CM

## 2012-10-10 DIAGNOSIS — I4891 Unspecified atrial fibrillation: Secondary | ICD-10-CM

## 2012-10-10 DIAGNOSIS — I2510 Atherosclerotic heart disease of native coronary artery without angina pectoris: Secondary | ICD-10-CM | POA: Diagnosis not present

## 2012-10-10 DIAGNOSIS — I472 Ventricular tachycardia: Secondary | ICD-10-CM

## 2012-10-10 LAB — COMPREHENSIVE METABOLIC PANEL
ALT: 11 U/L (ref 0–53)
CO2: 27 mEq/L (ref 19–32)
Calcium: 9.1 mg/dL (ref 8.4–10.5)
Chloride: 101 mEq/L (ref 96–112)
GFR: 52.03 mL/min — ABNORMAL LOW (ref >60.00)
Sodium: 134 mEq/L — ABNORMAL LOW (ref 135–145)
Total Protein: 7.2 g/dL (ref 6.0–8.3)

## 2012-10-10 NOTE — Patient Instructions (Addendum)
The current medical regimen is effective;  continue present plan and medications.  Please have blood work today.  Follow up in 2 months with Norma Fredrickson, NP.

## 2012-10-10 NOTE — Progress Notes (Signed)
HPI The patient presents for follow up of CAD and ischemic cardiomyopathy.  Since I last saw him he has had continued complaints of joint pains and muscle aches. He's bothered by anxiety and he continues to have multiple somatic complaints. At the last visit he thought he was feeling his palpitations. I put a Holter monitor on him which demonstrated well controlled atrial fibrillation. I also took him off Lipitor. He says that this dramatically helped his muscle aches. He's very nervous still however. He denies any new shortness of breath though he does get dyspneic with moderate exertion. He's not describing any resting shortness of breath, PND or orthopnea. He's not having any chest pressure, neck or arm discomfort. He's had no weight gain or edema.  No Known Allergies  Current Outpatient Prescriptions  Medication Sig Dispense Refill  . ALPRAZolam (XANAX) 0.5 MG tablet Take 0.5 mg by mouth 3 (three) times daily as needed. For anxiety.      Marland Kitchen aspirin 81 MG tablet Take 81 mg by mouth daily.        Marland Kitchen COUMADIN 5 MG tablet TAKE AS DIRECTED BY COUMADIN  CLINIC  30 tablet  3  . escitalopram (LEXAPRO) 10 MG tablet Take 10 mg by mouth daily.        . finasteride (PROSCAR) 5 MG tablet Take 5 mg by mouth daily.        Marland Kitchen levothyroxine (SYNTHROID, LEVOTHROID) 25 MCG tablet TAKE ONE TABLET BY MOUTH EVERY DAY  90 tablet  3  . losartan (COZAAR) 50 MG tablet Take 1 tablet (50 mg total) by mouth daily.  30 tablet  11  . PACERONE 200 MG tablet TAKE ONE TABLET BY MOUTH DAILY ON MONDAY THROUGH FRIDAY  20 tablet  4  . saw palmetto 160 MG capsule Take 160 mg by mouth daily.       . sitaGLIPtin (JANUVIA) 50 MG tablet Take 25 mg by mouth daily.        Marland Kitchen atorvastatin (LIPITOR) 10 MG tablet TAKE 1 TABLET (10 MG TOTAL) BY MOUTH DAILY.  30 tablet  6    Past Medical History  Diagnosis Date  . Chest discomfort   . Low back pain   . Ischemic cardiomyopathy     EF 23%  . Hearing loss   . SOB (shortness of breath)     . Joint pain   . VT (ventricular tachycardia)     s/p ICD explanted for infection.  The decision has been not to reimplant this given these difficulties.  . Anxiety   . Depression   . PAF (paroxysmal atrial fibrillation)   . Hyperlipidemia   . Gynecomastia   . BPH (benign prostatic hypertrophy)   . Diabetes mellitus   . Hypertension   . Degenerative joint disease   . GERD (gastroesophageal reflux disease)   . CAD (coronary artery disease)   . Pneumonia     Past Surgical History  Procedure Date  . Cardiac defibrillator removal   . Coronary artery bypass graft 2000    X5  . Cardiac catheterization 12/25/2008    THE LEFT VENTRICLE WAS ENLARGED. THERE IS ANTERIOR APICAL AND INFERIOR APICAL AKINESIA. EF ESTIMATED 20%. NO MITRAL REGURGITATION.  . Cardiac catheterization 12/2008    REPEAT CATH, SHOWED BYPASS GRAFTS WERE PATENT  . US echocardiography 2007    EF 20 to 25%  . Ablation of dysrhythmic focus   . Pars plana vitrectomy 09/24/2011    Procedure: PARS PLANA VITRECTOMY WITH 25  GAUGE;  Surgeon: Edmon Crape;  Location: [facility] OR;  Service: Ophthalmology;  Laterality: Left;  MEMBRANE PEEL, SILICONE OIL    ROS: Positive for joint pains, muscle aches, anxiety, tremor, constipation. Otherwise as stated in the HPI and negative for all other systems.  PHYSICAL EXAM BP 115/65  Pulse 95  Ht 5\' 9"  (1.753 m)  Wt 206 lb 6.4 oz (93.622 kg)  BMI 30.48 kg/m2 GENERAL:  Frail appearing HEENT:  Pupils equal round and reactive, fundi not visualized, oral mucosa unremarkable NECK:  No jugular venous distention, waveform within normal limits, carotid upstroke brisk and symmetric, no bruits, no thyromegaly LYMPHATICS:  No cervical, inguinal adenopathy LUNGS:  Clear to auscultation bilaterally BACK:  No CVA tenderness CHEST:  Well healed sternotomy scar, ICD scar healed. HEART:  PMI not displaced or sustained,S1 and S2 within normal limits, no S3, no clicks, no rubs, no murmurs, irregular ABD:   Flat, positive bowel sounds normal in frequency in pitch, no bruits, no rebound, no guarding, no midline pulsatile mass, no hepatomegaly, no splenomegaly EXT:  2 plus pulses throughout, no edema, no cyanosis no clubbing NEURO:  Nonfocal, resting tremor  EKG:  Atrial fibrillation, right bundle branch block, left anterior fascicular block, no acute ST-T wave changes. Rate 95. 10/10/2012  ASSESSMENT AND PLAN  CARDIOMYOPATHY, ISCHEMIC -  His EF was slightly better on his echo earlier this year. He seems to be euvolemic. He will for now continue the meds as listed. He has not wanted to consider reimplantation of his ICD.   Atrial fibrillation -  He is in atrial fibrillation and he thinks he notices this.  However, the rate seems to be controlled and he is tolerating anticoagulation. His rate is controlled on Holter. Amiodarone does help with his rate control and so I will continue it. He also had a history of V. tach and does not have a defibrillator because of previous infection.  CAD (coronary artery disease) -  The patient has no new sypmtoms. No further cardiovascular testing is indicated. We will continue with aggressive risk reduction and meds as listed.   HYPERTENSION, UNSPECIFIED -  The blood pressure is at target. No change in medications is indicated. We will continue with therapeutic lifestyle changes (TLC).   DYSLIPIDEMIA - His muscle aches are much improved off of Lipitor. Therefore, I'm going to discontinue this medication altogether. I may consider restarting a medicine like pravastatin in the future.

## 2012-10-19 DIAGNOSIS — I4891 Unspecified atrial fibrillation: Secondary | ICD-10-CM | POA: Diagnosis not present

## 2012-10-19 DIAGNOSIS — E119 Type 2 diabetes mellitus without complications: Secondary | ICD-10-CM | POA: Diagnosis not present

## 2012-10-19 DIAGNOSIS — I509 Heart failure, unspecified: Secondary | ICD-10-CM | POA: Diagnosis not present

## 2012-10-19 DIAGNOSIS — D509 Iron deficiency anemia, unspecified: Secondary | ICD-10-CM | POA: Diagnosis not present

## 2012-10-25 NOTE — Progress Notes (Signed)
This encounter was created in error - please disregard.

## 2012-10-27 ENCOUNTER — Ambulatory Visit (INDEPENDENT_AMBULATORY_CARE_PROVIDER_SITE_OTHER): Payer: Medicare Other | Admitting: *Deleted

## 2012-10-27 DIAGNOSIS — I4891 Unspecified atrial fibrillation: Secondary | ICD-10-CM

## 2012-10-27 DIAGNOSIS — Z7901 Long term (current) use of anticoagulants: Secondary | ICD-10-CM

## 2012-11-07 ENCOUNTER — Telehealth: Payer: Self-pay | Admitting: Cardiology

## 2012-11-07 NOTE — Telephone Encounter (Signed)
New problem:    C/O heart racing .   

## 2012-11-07 NOTE — Telephone Encounter (Signed)
Pt calling c/o HR being elevated up to 120 bpm and that he can hardly do any thing because he feels so bad.  Wants to know what to do or be seen tomorrow.  Pt aware Dr Antoine Poche not in the [place] office tomorrow and Lawson Fiscal Gerhardt's schedule is full.  Aware I will try to contact Dr Antoine Poche for orders.

## 2012-11-07 NOTE — Telephone Encounter (Signed)
Spoke with wife - attempted to have pt be seen in the [place] office tomorrow however the pt would like to see Lawson Fiscal.  He is aware that at this point she does not have an appointment available tomorrow.  I scheduled pt an appt for Thursday at 11am with Dr Antoine Poche.  Pt wants me to ask Lawson Fiscal in the am if she will work him in.  Aware I will contact him either way in the AM before 9.

## 2012-11-08 NOTE — Telephone Encounter (Signed)
Sorry. My day is pretty hectic.

## 2012-11-08 NOTE — Telephone Encounter (Signed)
Pt's wife is aware and he will come to be evaluated tomorrow in the [place] office.   He will call back if s/s change.

## 2012-11-09 ENCOUNTER — Ambulatory Visit (INDEPENDENT_AMBULATORY_CARE_PROVIDER_SITE_OTHER): Payer: Medicare Other | Admitting: Cardiology

## 2012-11-09 ENCOUNTER — Encounter: Payer: Self-pay | Admitting: Cardiology

## 2012-11-09 VITALS — BP 124/84 | HR 98 | Ht 69.0 in | Wt 209.8 lb

## 2012-11-09 DIAGNOSIS — I219 Acute myocardial infarction, unspecified: Secondary | ICD-10-CM

## 2012-11-09 DIAGNOSIS — I2589 Other forms of chronic ischemic heart disease: Secondary | ICD-10-CM

## 2012-11-09 DIAGNOSIS — I4892 Unspecified atrial flutter: Secondary | ICD-10-CM

## 2012-11-09 DIAGNOSIS — I495 Sick sinus syndrome: Secondary | ICD-10-CM

## 2012-11-09 DIAGNOSIS — I472 Ventricular tachycardia: Secondary | ICD-10-CM

## 2012-11-09 DIAGNOSIS — I238 Other current complications following acute myocardial infarction: Secondary | ICD-10-CM

## 2012-11-09 DIAGNOSIS — I5023 Acute on chronic systolic (congestive) heart failure: Secondary | ICD-10-CM | POA: Diagnosis not present

## 2012-11-09 DIAGNOSIS — I4891 Unspecified atrial fibrillation: Secondary | ICD-10-CM

## 2012-11-09 DIAGNOSIS — I2510 Atherosclerotic heart disease of native coronary artery without angina pectoris: Secondary | ICD-10-CM

## 2012-11-09 DIAGNOSIS — I10 Essential (primary) hypertension: Secondary | ICD-10-CM

## 2012-11-09 MED ORDER — DILTIAZEM HCL ER COATED BEADS 120 MG PO CP24: 120.0000 mg | ORAL_CAPSULE | Freq: Every day | ORAL | Status: DC

## 2012-11-09 NOTE — Progress Notes (Signed)
HPI The patient presents for follow up of CAD and ischemic cardiomyopathy.  He called to be added to the schedule because he is having increased heart rate. He says it'll go into the 120s with movement. He's not had any presyncope or syncope but he is chronically lightheaded. Chronically dyspneic though this is at baseline. He sleeps on 4 pillows and this has not changed. He's not had any overt symptoms of heart failure as his weights have been stable and he has no increased edema. He's he is still very nervous. . He's not having any chest pressure, neck or arm discomfort.  No Known Allergies  Current Outpatient Prescriptions  Medication Sig Dispense Refill  . ALPRAZolam (XANAX) 0.5 MG tablet Take 0.5 mg by mouth 3 (three) times daily as needed. For anxiety.      Marland Kitchen aspirin 81 MG tablet Take 81 mg by mouth daily.        Marland Kitchen atorvastatin (LIPITOR) 10 MG tablet TAKE 1 TABLET (10 MG TOTAL) BY MOUTH DAILY.  30 tablet  6  . COUMADIN 5 MG tablet TAKE AS DIRECTED BY COUMADIN  CLINIC  30 tablet  3  . escitalopram (LEXAPRO) 10 MG tablet Take 10 mg by mouth daily.        . finasteride (PROSCAR) 5 MG tablet Take 5 mg by mouth daily.        Marland Kitchen levothyroxine (SYNTHROID, LEVOTHROID) 25 MCG tablet TAKE ONE TABLET BY MOUTH EVERY DAY  90 tablet  3  . losartan (COZAAR) 50 MG tablet Take 1 tablet (50 mg total) by mouth daily.  30 tablet  11  . PACERONE 200 MG tablet TAKE ONE TABLET BY MOUTH DAILY ON MONDAY THROUGH FRIDAY  20 tablet  4  . saw palmetto 160 MG capsule Take 160 mg by mouth daily.       . sitaGLIPtin (JANUVIA) 50 MG tablet Take 25 mg by mouth daily.          Past Medical History  Diagnosis Date  . Chest discomfort   . Low back pain   . Ischemic cardiomyopathy     EF 23%  . Hearing loss   . SOB (shortness of breath)   . Joint pain   . VT (ventricular tachycardia)     s/p ICD explanted for infection.  The decision has been not to reimplant this given these difficulties.  . Anxiety   .  Depression   . PAF (paroxysmal atrial fibrillation)   . Hyperlipidemia   . Gynecomastia   . BPH (benign prostatic hypertrophy)   . Diabetes mellitus   . Hypertension   . Degenerative joint disease   . GERD (gastroesophageal reflux disease)   . CAD (coronary artery disease)   . Pneumonia     Past Surgical History  Procedure Date  . Cardiac defibrillator removal   . Coronary artery bypass graft 2000    X5  . Cardiac catheterization 12/25/2008    THE LEFT VENTRICLE WAS ENLARGED. THERE IS ANTERIOR APICAL AND INFERIOR APICAL AKINESIA. EF ESTIMATED 20%. NO MITRAL REGURGITATION.  . Cardiac catheterization 12/2008    REPEAT CATH, SHOWED BYPASS GRAFTS WERE PATENT  . US echocardiography 2007    EF 20 to 25%  . Ablation of dysrhythmic focus   . Pars plana vitrectomy 09/24/2011    Procedure: PARS PLANA VITRECTOMY WITH 25 GAUGE;  Surgeon: Alford Highland Rankin;  Location: [facility] OR;  Service: Ophthalmology;  Laterality: Left;  MEMBRANE PEEL, SILICONE OIL    ROS:  Positive for multiple somatic complaints, tremor, anxiety. Otherwise as stated in the HPI and negative for all other systems.  PHYSICAL EXAM BP 124/84  Pulse 98  Ht 5\' 9"  (1.753 m)  Wt 209 lb 12.8 oz (95.165 kg)  BMI 30.98 kg/m2 GENERAL:  Frail appearing HEENT:  Pupils equal round and reactive, fundi not visualized, oral mucosa unremarkable NECK:  No jugular venous distention, waveform within normal limits, carotid upstroke brisk and symmetric, no bruits, no thyromegaly LYMPHATICS:  No cervical, inguinal adenopathy LUNGS:  Clear to auscultation bilaterally BACK:  No CVA tenderness CHEST:  Well healed sternotomy scar, ICD scar healed. HEART:  PMI not displaced or sustained,S1 and S2 within normal limits, no S3, no clicks, no rubs, no murmurs, irregular ABD:  Flat, positive bowel sounds normal in frequency in pitch, no bruits, no rebound, no guarding, no midline pulsatile mass, no hepatomegaly, no splenomegaly EXT:  2 plus pulses throughout,  no edema, no cyanosis no clubbing NEURO:  Nonfocal, resting tremor  EKG:  Atrial flutter, right bundle branch block, left anterior fascicular block, no acute ST-T wave changes. Rate 114. 11/09/2012  ASSESSMENT AND PLAN  CARDIOMYOPATHY, ISCHEMIC -  His EF was slightly better on his echo earlier this year. He still seems to be euvolemic. He will for now continue the meds as listed. He has not wanted to consider reimplantation of his ICD.   Atrial fibrillation -  Today he is in flutter and may be going slightly faster with this. I will try Cardizem 120 mg. I don't see that he has failed this in the past. I'm going to have him come back to see Dr. Graciela Husbands. I doubt that flutter ablation would be a viable option but should be explored.  CAD (coronary artery disease) -  The patient has no new sypmtoms. No further cardiovascular testing is indicated. We will continue with aggressive risk reduction and meds as listed.   HYPERTENSION, UNSPECIFIED -  His blood pressure is reasonable. He's not had any hypotension. I'm hoping he can tolerate the addition of Cardizem.

## 2012-11-09 NOTE — Patient Instructions (Addendum)
Please start Diltiazem 120 mg a day Continue all other medications as listed  You have been referred to Dr Graciela Husbands for the evaluation of Atrial Flutter.

## 2012-11-14 ENCOUNTER — Ambulatory Visit (INDEPENDENT_AMBULATORY_CARE_PROVIDER_SITE_OTHER): Payer: Medicare Other | Admitting: Pharmacist

## 2012-11-14 ENCOUNTER — Encounter: Payer: Self-pay | Admitting: *Deleted

## 2012-11-14 ENCOUNTER — Ambulatory Visit (INDEPENDENT_AMBULATORY_CARE_PROVIDER_SITE_OTHER): Payer: Medicare Other | Admitting: Internal Medicine

## 2012-11-14 ENCOUNTER — Encounter: Payer: Self-pay | Admitting: Internal Medicine

## 2012-11-14 VITALS — BP 120/70 | HR 103 | Ht 69.0 in | Wt 209.0 lb

## 2012-11-14 DIAGNOSIS — I472 Ventricular tachycardia: Secondary | ICD-10-CM | POA: Diagnosis not present

## 2012-11-14 DIAGNOSIS — I2589 Other forms of chronic ischemic heart disease: Secondary | ICD-10-CM | POA: Diagnosis not present

## 2012-11-14 DIAGNOSIS — R42 Dizziness and giddiness: Secondary | ICD-10-CM

## 2012-11-14 DIAGNOSIS — I4891 Unspecified atrial fibrillation: Secondary | ICD-10-CM

## 2012-11-14 DIAGNOSIS — Z7901 Long term (current) use of anticoagulants: Secondary | ICD-10-CM | POA: Diagnosis not present

## 2012-11-14 DIAGNOSIS — R259 Unspecified abnormal involuntary movements: Secondary | ICD-10-CM | POA: Diagnosis not present

## 2012-11-14 NOTE — Assessment & Plan Note (Signed)
As above.

## 2012-11-14 NOTE — Progress Notes (Signed)
Patient Care Team: Mark A Perini, MD as PCP - General (Internal Medicine)   HPI  [name] is a 78 y.o. male Seen in followup for an ICD implanted years ago subsequently explanted because of infection. 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 He ended up being treated with a catheter ablation at [facility] 2005  Is then followed by Dr. JH. He was found last week to have atrial flutter. He is referred for consideration of therapies. He was started on rate control the context of chronic therapy with amiodarone and Coumadin  That I have found for review of the chart is that the Coumadin was for apical thrombus; although past medical history identified atrial fibrillation I don't see any specific notes identifying this.  His major problems with falling and tremor. He is orthostatic intolerance and shower intolerance. He has been on amiodarone since 2002 when he presented VT storm. He's had no post ablation VT of which we know.  He also has significant exercise associated chest discomfort. He had a negative stress test apparently with Dr. Perini a couple of years ago. He has a history of remote CABG     Past Medical History  Diagnosis Date  . Chest discomfort   . Low back pain   . Ischemic cardiomyopathy     EF 23%  . Hearing loss   . SOB (shortness of breath)   . Joint pain   . VT (ventricular tachycardia)     s/p ICD explanted for infection.  The decision has been not to reimplant this given these difficulties.  . Anxiety   . Depression   . PAF (paroxysmal atrial fibrillation)   . Hyperlipidemia   . Gynecomastia   . BPH (benign prostatic hypertrophy)   . Diabetes mellitus   . Hypertension   . Degenerative joint disease   . GERD (gastroesophageal reflux disease)   . CAD (coronary artery disease)   . Pneumonia     Past Surgical History  Procedure Laterality Date  . Cardiac defibrillator removal    . Coronary artery bypass graft  2000    X5  . Cardiac  catheterization  12/25/2008    THE LEFT VENTRICLE WAS ENLARGED. THERE IS ANTERIOR APICAL AND INFERIOR APICAL AKINESIA. EF ESTIMATED 20%. NO MITRAL REGURGITATION.  . Cardiac catheterization  12/2008    REPEAT CATH, SHOWED BYPASS GRAFTS WERE PATENT  . Us echocardiography  2007    EF 20 to 25%  . Ablation of dysrhythmic focus    . Pars plana vitrectomy  09/24/2011    Procedure: PARS PLANA VITRECTOMY WITH 25 GAUGE;  Surgeon: Gary A Rankin;  Location: [facility] OR;  Service: Ophthalmology;  Laterality: Left;  MEMBRANE PEEL, SILICONE OIL    Current Outpatient Prescriptions  Medication Sig Dispense Refill  . ALPRAZolam (XANAX) 0.5 MG tablet Take 0.5 mg by mouth 3 (three) times daily as needed. For anxiety.      . aspirin 81 MG tablet Take 81 mg by mouth daily.        . COUMADIN 5 MG tablet TAKE AS DIRECTED BY COUMADIN  CLINIC  30 tablet  3  . diltiazem (CARDIZEM CD) 120 MG 24 hr capsule Take 1 capsule (120 mg total) by mouth daily.  30 capsule  11  . escitalopram (LEXAPRO) 10 MG tablet Take 10 mg by mouth daily.        . finasteride (PROSCAR) 5 MG tablet Take 5 mg by mouth daily.        .   levothyroxine (SYNTHROID, LEVOTHROID) 25 MCG tablet TAKE ONE TABLET BY MOUTH EVERY DAY  90 tablet  3  . losartan (COZAAR) 50 MG tablet Take 1 tablet (50 mg total) by mouth daily.  30 tablet  11  . PACERONE 200 MG tablet TAKE ONE TABLET BY MOUTH DAILY ON MONDAY THROUGH FRIDAY  20 tablet  4  . pravastatin (PRAVACHOL) 20 MG tablet Take 20 mg by mouth daily.      . saw palmetto 160 MG capsule Take 160 mg by mouth daily.       . sitaGLIPtin (JANUVIA) 50 MG tablet Take 25 mg by mouth daily.         No current facility-administered medications for this visit.    No Known Allergies  fam hx neg for arrhtymia disease  Review of Systems negative except from HPI and PMH  Physical Exam BP 114/84  Pulse 89  Ht 5' 9" (1.753 m)  Wt 209 lb (94.802 kg)  BMI 30.85 kg/m2 Alert and oriented in no acute distress HENT-  normal Eyes- EOMI, without scleral icterus Skin- warm and dry; without rashes LN-neg Neck- supple without thyromegaly, JVP-8-10 cm carotids brisk and full without bruits Back-without CVAT or kyphosis Lungs-clear to auscultation CV-irregularly irregular  rate and rhythm, nl S1 and S2 2/6 murmur along the right sternal border Abd-soft with active bowel sounds; no midline pulsation or hepatomegaly Pulses-intact femoral and distal MKS-without gross deformity Neuro- Ax O, CN3-12 intact, tremulous and with a wide-based gait Affect engaging  ECG demonstrates an irregular wide complex tachycardia which upon review I think his atrial fibrillation. I reviewed with Dr. GT. He concurs.   Assessment and  Plan 2 

## 2012-11-14 NOTE — Assessment & Plan Note (Signed)
Patient has recurrent atrial fibrillation. He is on warfarin. We will plan to cardioversion. I'm concerned about amiodarone neurotoxicity as a contributor to his orthostasis and his tremor. Will have him decrease his amiodarone dose. However, they're not sure he is taking 400 mg or 200 mg tablets. They will call for this information. There has been no ventricular tachycardia that we know of in the last 9 years following his ablation. The amiodarone dates to that.

## 2012-11-14 NOTE — Assessment & Plan Note (Signed)
The patient has had exercise intolerance with exertional chest discomfort and shortness of breath. I will review with Dr. [facility] whether he would like to pursue catheterization

## 2012-11-14 NOTE — Patient Instructions (Signed)

## 2012-11-14 NOTE — Assessment & Plan Note (Signed)
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 Given the absence of ventricular tachycardia over the last half dozen years following his ablation, I think is probably reasonable not withstanding his atrial fibrillation to begin to wean him off. The family will me know what his dose is; they're not sure there is taking one half of a 200 or 400 mg tablet. Amiodarone neurotoxicity thought to be dose related and reversible. There may be another cause of the neuropathy but I think this is to be excluded first

## 2012-11-20 ENCOUNTER — Ambulatory Visit (HOSPITAL_COMMUNITY)
Admission: RE | Admit: 2012-11-20 | Discharge: 2012-11-20 | Disposition: A | Discharge status: Home / Self Care | Payer: Medicare Other | Source: Ambulatory Visit | Attending: Internal Medicine | Admitting: Internal Medicine

## 2012-11-20 ENCOUNTER — Encounter (HOSPITAL_COMMUNITY): Payer: Self-pay

## 2012-11-20 ENCOUNTER — Ambulatory Visit (HOSPITAL_COMMUNITY): Payer: Medicare Other | Admitting: *Deleted

## 2012-11-20 ENCOUNTER — Encounter (HOSPITAL_COMMUNITY)
Admission: RE | Disposition: A | Discharge status: Home / Self Care | Payer: Self-pay | Source: Ambulatory Visit | Attending: Internal Medicine

## 2012-11-20 ENCOUNTER — Encounter (HOSPITAL_COMMUNITY): Payer: Self-pay | Admitting: *Deleted

## 2012-11-20 DIAGNOSIS — I2589 Other forms of chronic ischemic heart disease: Secondary | ICD-10-CM | POA: Insufficient documentation

## 2012-11-20 DIAGNOSIS — I4891 Unspecified atrial fibrillation: Secondary | ICD-10-CM | POA: Insufficient documentation

## 2012-11-20 DIAGNOSIS — I10 Essential (primary) hypertension: Secondary | ICD-10-CM | POA: Diagnosis not present

## 2012-11-20 DIAGNOSIS — R0602 Shortness of breath: Secondary | ICD-10-CM | POA: Diagnosis not present

## 2012-11-20 DIAGNOSIS — E119 Type 2 diabetes mellitus without complications: Secondary | ICD-10-CM | POA: Diagnosis not present

## 2012-11-20 DIAGNOSIS — M549 Dorsalgia, unspecified: Secondary | ICD-10-CM | POA: Diagnosis not present

## 2012-11-20 HISTORY — PX: CARDIOVERSION: SHX1299

## 2012-11-20 SURGERY — CARDIOVERSION
Anesthesia: Monitor Anesthesia Care

## 2012-11-20 MED ORDER — SODIUM CHLORIDE 0.9 % IJ SOLN
3.0000 mL | Freq: Two times a day (BID) | INTRAMUSCULAR | Status: DC
Start: 2012-11-20 — End: 2012-11-20

## 2012-11-20 MED ORDER — SODIUM CHLORIDE 0.9 % IV SOLN
250.0000 mL | INTRAVENOUS | Status: DC
  Administered 2012-11-20: 11:00:00 via INTRAVENOUS

## 2012-11-20 MED ORDER — PROPOFOL 10 MG/ML IV EMUL
INTRAVENOUS | Status: DC | PRN
  Administered 2012-11-20: 150 mL via INTRAVENOUS

## 2012-11-20 MED ORDER — SODIUM CHLORIDE 0.9 % IJ SOLN: 3.0000 mL | INTRAMUSCULAR | Status: DC | PRN

## 2012-11-20 NOTE — Anesthesia Preprocedure Evaluation (Addendum)
Anesthesia Evaluation  Patient identified by MRN, date of birth, ID band Patient awake    Reviewed: Allergy & Precautions, H&P , NPO status , Patient's Chart, lab work & pertinent test results  Airway Mallampati: II TM Distance: >3 FB Neck ROM: Full    Dental  (+) Dental Advisory Given   Pulmonary          Cardiovascular hypertension, + CAD and + Past MI + dysrhythmias     Neuro/Psych    GI/Hepatic GERD-  ,  Endo/Other  diabetes  Renal/GU      Musculoskeletal   Abdominal   Peds  Hematology   Anesthesia Other Findings   Reproductive/Obstetrics                           Anesthesia Physical Anesthesia Plan  ASA: III  Anesthesia Plan: MAC   Post-op Pain Management:    Induction:   Airway Management Planned:   Additional Equipment:   Intra-op Plan:   Post-operative Plan:   Informed Consent:   Plan Discussed with: CRNA, Anesthesiologist and Surgeon  Anesthesia Plan Comments:        Anesthesia Quick Evaluation

## 2012-11-20 NOTE — Preoperative (Signed)
Beta Blockers   Reason not to administer Beta Blockers:Not Applicable 

## 2012-11-20 NOTE — Op Note (Addendum)
Patient anesthetized with 100 mg Propofol IV   With pads in AP position, patient cardioverted to SR with 200J synchronized biphasic energy 12 lead EKG pending. Procedure without complications.

## 2012-11-20 NOTE — H&P (View-Only) (Signed)
Patient Care Team: Ezequiel Kayser, MD as PCP - General (Internal Medicine)   HPI  [name] is a 78 y.o. male Seen in followup for an ICD implanted years ago subsequently explanted because of infection. 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 He ended up being treated with a catheter ablation at [facility]  Is then followed by [facility]. [facility]. He was found last week to have atrial flutter. He is referred for consideration of therapies. He was started on rate control the context of chronic therapy with amiodarone and Coumadin  That I have found for review of the chart is that the Coumadin was for apical thrombus; although past medical history identified atrial fibrillation I don't see any specific notes identifying this.  His major problems with falling and tremor. He is orthostatic intolerance and shower intolerance. He has been on amiodarone since 2002 when he presented VT storm. He's had no post ablation VT of which we know.  He also has significant exercise associated chest discomfort. He had a negative stress test apparently with Dr. Waynard Edwards a couple of years ago. He has a history of remote CABG     Past Medical History  Diagnosis Date  . Chest discomfort   . Low back pain   . Ischemic cardiomyopathy     EF 23%  . Hearing loss   . SOB (shortness of breath)   . Joint pain   . VT (ventricular tachycardia)     s/p ICD explanted for infection.  The decision has been not to reimplant this given these difficulties.  . Anxiety   . Depression   . PAF (paroxysmal atrial fibrillation)   . Hyperlipidemia   . Gynecomastia   . BPH (benign prostatic hypertrophy)   . Diabetes mellitus   . Hypertension   . Degenerative joint disease   . GERD (gastroesophageal reflux disease)   . CAD (coronary artery disease)   . Pneumonia     Past Surgical History  Procedure Laterality Date  . Cardiac defibrillator removal    . Coronary artery bypass graft  2000    X5  . Cardiac  catheterization  12/25/2008    THE LEFT VENTRICLE WAS ENLARGED. THERE IS ANTERIOR APICAL AND INFERIOR APICAL AKINESIA. EF ESTIMATED 20%. NO MITRAL REGURGITATION.  . Cardiac catheterization  12/2008    REPEAT CATH, SHOWED BYPASS GRAFTS WERE PATENT  . US echocardiography  2007    EF 20 to 25%  . Ablation of dysrhythmic focus    . Pars plana vitrectomy  09/24/2011    Procedure: PARS PLANA VITRECTOMY WITH 25 GAUGE;  Surgeon: Alford Highland Rankin;  Location: [facility] OR;  Service: Ophthalmology;  Laterality: Left;  MEMBRANE PEEL, SILICONE OIL    Current Outpatient Prescriptions  Medication Sig Dispense Refill  . ALPRAZolam (XANAX) 0.5 MG tablet Take 0.5 mg by mouth 3 (three) times daily as needed. For anxiety.      Marland Kitchen aspirin 81 MG tablet Take 81 mg by mouth daily.        Marland Kitchen COUMADIN 5 MG tablet TAKE AS DIRECTED BY COUMADIN  CLINIC  30 tablet  3  . diltiazem (CARDIZEM CD) 120 MG 24 hr capsule Take 1 capsule (120 mg total) by mouth daily.  30 capsule  11  . escitalopram (LEXAPRO) 10 MG tablet Take 10 mg by mouth daily.        . finasteride (PROSCAR) 5 MG tablet Take 5 mg by mouth daily.        Marland Kitchen  levothyroxine (SYNTHROID, LEVOTHROID) 25 MCG tablet TAKE ONE TABLET BY MOUTH EVERY DAY  90 tablet  3  . losartan (COZAAR) 50 MG tablet Take 1 tablet (50 mg total) by mouth daily.  30 tablet  11  . PACERONE 200 MG tablet TAKE ONE TABLET BY MOUTH DAILY ON MONDAY THROUGH FRIDAY  20 tablet  4  . pravastatin (PRAVACHOL) 20 MG tablet Take 20 mg by mouth daily.      . saw palmetto 160 MG capsule Take 160 mg by mouth daily.       . sitaGLIPtin (JANUVIA) 50 MG tablet Take 25 mg by mouth daily.         No current facility-administered medications for this visit.    No Known Allergies  fam hx neg for arrhtymia disease  Review of Systems negative except from HPI and PMH  Physical Exam BP 114/84  Pulse 89  Ht 5\' 9"  (1.753 m)  Wt 209 lb (94.802 kg)  BMI 30.85 kg/m2 Alert and oriented in no acute distress HENT-  normal Eyes- EOMI, without scleral icterus Skin- warm and dry; without rashes LN-neg Neck- supple without thyromegaly, JVP-8-10 cm carotids brisk and full without bruits Back-without CVAT or kyphosis Lungs-clear to auscultation CV-irregularly irregular  rate and rhythm, nl S1 and S2 2/6 murmur along the right sternal border Abd-soft with active bowel sounds; no midline pulsation or hepatomegaly Pulses-intact femoral and distal MKS-without gross deformity Neuro- Ax O, CN3-12 intact, tremulous and with a wide-based gait Affect engaging  ECG demonstrates an irregular wide complex tachycardia which upon review I think his atrial fibrillation. I reviewed with Dr. Leonia Reeves. He concurs.   Assessment and  Plan 2

## 2012-11-20 NOTE — Transfer of Care (Signed)
Immediate Anesthesia Transfer of Care Note  Patient: [name]  Procedure(s) Performed: Procedure(s): CARDIOVERSION (N/A)  Patient Location: Endoscopy Unit  Anesthesia Type:MAC  Level of Consciousness: awake, alert  and oriented  Airway & Oxygen Therapy: Patient Spontanous Breathing and Patient connected to nasal cannula oxygen  Post-op Assessment: Report given to PACU RN, Post -op Vital signs reviewed and stable and Patient moving all extremities X 4  Post vital signs: Reviewed and stable  Complications: No apparent anesthesia complications

## 2012-11-20 NOTE — Interval H&P Note (Signed)
History and Physical Interval Note:  11/20/2012 10:50 AM  [name]  has presented today for surgery, with the diagnosis of a-fib  The various methods of treatment have been discussed with the patient and family. After consideration of risks, benefits and other options for treatment, the patient has consented to  Procedure(s): CARDIOVERSION (N/A) as a surgical intervention .  The patient's history has been reviewed, patient examined, no change in status, stable for surgery.  I have reviewed the patient's chart and labs.  Questions were answered to the patient's satisfaction.     Dietrich Pates

## 2012-11-21 ENCOUNTER — Encounter (HOSPITAL_COMMUNITY): Payer: Self-pay | Admitting: Internal Medicine

## 2012-11-21 NOTE — Anesthesia Postprocedure Evaluation (Signed)
  Anesthesia Post-op Note  Patient: [name]  Procedure(s) Performed: * No procedures listed *  Patient Location: PACU and Endoscopy Unit  Anesthesia Type:General  Level of Consciousness: awake, alert  and oriented  Airway and Oxygen Therapy: Patient Spontanous Breathing  Post-op Pain: none  Post-op Assessment: Post-op Vital signs reviewed  Post-op Vital Signs: Reviewed  Complications: No apparent anesthesia complications

## 2012-11-27 ENCOUNTER — Telehealth: Payer: Self-pay | Admitting: Cardiology

## 2012-11-27 NOTE — Telephone Encounter (Signed)
Patient's wife walked in wanting to make new appt based off of coumdain levels while in [facility] over there weekend.  Was told that we could contact him with this information.  Please contact them at home.

## 2012-11-27 NOTE — Telephone Encounter (Signed)
Pt was in [facility] for DCCV on 2/17.  He will need weekly INR's x 4.  He has appt scheduled for 2/25.  Will keep that appt.  Wife informed and she verbalized understanding.

## 2012-11-28 ENCOUNTER — Ambulatory Visit (INDEPENDENT_AMBULATORY_CARE_PROVIDER_SITE_OTHER): Payer: Medicare Other | Admitting: *Deleted

## 2012-11-28 DIAGNOSIS — I4891 Unspecified atrial fibrillation: Secondary | ICD-10-CM | POA: Diagnosis not present

## 2012-11-28 DIAGNOSIS — Z7901 Long term (current) use of anticoagulants: Secondary | ICD-10-CM

## 2012-12-01 ENCOUNTER — Institutional Professional Consult (permissible substitution): Payer: Medicare Other | Admitting: Internal Medicine

## 2012-12-05 ENCOUNTER — Ambulatory Visit (INDEPENDENT_AMBULATORY_CARE_PROVIDER_SITE_OTHER): Payer: Medicare Other | Admitting: *Deleted

## 2012-12-05 DIAGNOSIS — Z7901 Long term (current) use of anticoagulants: Secondary | ICD-10-CM

## 2012-12-05 DIAGNOSIS — I4891 Unspecified atrial fibrillation: Secondary | ICD-10-CM

## 2012-12-05 LAB — POCT INR: INR: 2.6

## 2012-12-08 ENCOUNTER — Ambulatory Visit (INDEPENDENT_AMBULATORY_CARE_PROVIDER_SITE_OTHER): Payer: Medicare Other | Admitting: Ophthalmology

## 2012-12-12 ENCOUNTER — Other Ambulatory Visit: Payer: Self-pay | Admitting: *Deleted

## 2012-12-12 ENCOUNTER — Encounter: Payer: Self-pay | Admitting: Cardiology

## 2012-12-12 ENCOUNTER — Encounter: Payer: Self-pay | Admitting: Nurse Practitioner

## 2012-12-12 ENCOUNTER — Ambulatory Visit (INDEPENDENT_AMBULATORY_CARE_PROVIDER_SITE_OTHER): Payer: Medicare Other | Admitting: Nurse Practitioner

## 2012-12-12 VITALS — BP 120/68 | HR 99 | Ht 70.0 in | Wt 206.8 lb

## 2012-12-12 DIAGNOSIS — I4891 Unspecified atrial fibrillation: Secondary | ICD-10-CM | POA: Diagnosis not present

## 2012-12-12 DIAGNOSIS — I2589 Other forms of chronic ischemic heart disease: Secondary | ICD-10-CM

## 2012-12-12 DIAGNOSIS — I255 Ischemic cardiomyopathy: Secondary | ICD-10-CM

## 2012-12-12 LAB — BASIC METABOLIC PANEL
BUN: 16 mg/dL (ref 6–23)
CO2: 27 mEq/L (ref 19–32)
Calcium: 8.9 mg/dL (ref 8.4–10.5)
Chloride: 104 mEq/L (ref 96–112)
Creatinine, Ser: 1.5 mg/dL (ref 0.4–1.5)
GFR: 50.35 mL/min — ABNORMAL LOW (ref >60.00)
Glucose, Bld: 102 mg/dL — ABNORMAL HIGH (ref 70–99)
Potassium: 3.7 mEq/L (ref 3.5–5.1)
Sodium: 137 mEq/L (ref 135–145)

## 2012-12-12 LAB — CBC WITH DIFFERENTIAL/PLATELET
Basophils Absolute: 0 10*3/uL (ref 0.0–0.1)
Basophils Relative: 0.5 % (ref 0.0–3.0)
Eosinophils Absolute: 0 10*3/uL (ref 0.0–0.7)
Eosinophils Relative: 0.8 % (ref 0.0–5.0)
HCT: 36.4 % — ABNORMAL LOW (ref 39.0–52.0)
Hemoglobin: 12.1 g/dL — ABNORMAL LOW (ref 13.0–17.0)
Lymphocytes Relative: 20.4 % (ref 12.0–46.0)
Lymphs Abs: 1.1 10*3/uL (ref 0.7–4.0)
MCHC: 33.2 g/dL (ref 30.0–36.0)
MCV: 86.3 fl (ref 78.0–100.0)
Monocytes Absolute: 0.6 10*3/uL (ref 0.1–1.0)
Monocytes Relative: 9.9 % (ref 3.0–12.0)
Neutro Abs: 3.8 10*3/uL (ref 1.4–7.7)
Neutrophils Relative %: 68.4 % (ref 43.0–77.0)
Platelets: 217 10*3/uL (ref 150.0–400.0)
RBC: 4.21 Mil/uL — ABNORMAL LOW (ref 4.22–5.81)
RDW: 14.9 % — ABNORMAL HIGH (ref 11.5–14.6)
WBC: 5.6 10*3/uL (ref 4.5–10.5)

## 2012-12-12 MED ORDER — AMIODARONE HCL 200 MG PO TABS: 200.0000 mg | ORAL_TABLET | Freq: Two times a day (BID) | ORAL | Status: DC
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
## 2012-12-12 NOTE — Patient Instructions (Addendum)
I am going to increase the amiodarone to 200 mg two times a day  (full tablet twice a day) every day  I will see you back in about 10 days and set up another cardioversion  We need to get an ultrasound of your heart.  We need to check labs today.  Call the [facility] office at [phone] if you have any questions, problems or concerns.

## 2012-12-14 ENCOUNTER — Encounter: Payer: Medicare Other | Admitting: *Deleted

## 2012-12-14 ENCOUNTER — Other Ambulatory Visit: Payer: Self-pay

## 2012-12-14 ENCOUNTER — Other Ambulatory Visit (INDEPENDENT_AMBULATORY_CARE_PROVIDER_SITE_OTHER): Payer: Medicare Other

## 2012-12-14 ENCOUNTER — Telehealth: Payer: Self-pay | Admitting: Nurse Practitioner

## 2012-12-14 DIAGNOSIS — I4891 Unspecified atrial fibrillation: Secondary | ICD-10-CM

## 2012-12-14 DIAGNOSIS — I428 Other cardiomyopathies: Secondary | ICD-10-CM | POA: Diagnosis not present

## 2012-12-14 DIAGNOSIS — Z7901 Long term (current) use of anticoagulants: Secondary | ICD-10-CM

## 2012-12-14 NOTE — Telephone Encounter (Signed)
Patient was in for office for Echo and coumadin check with nurse.  Patient was told the clinic was running behind and [place] knew he was here.  Patient was tired of waiting and decided to leave.  Offered him our [place] office since he was going to [place] in [place].  He declined.  Patient was offered appointment for Friday in [place] and could come at 1100.  Confirmed with Misty Stanley she can see patient and she advised patient has to be seen for cardioversion.  Left message on vmail for patient to come tomorrow at 1100 in [place] office and has to be seen.

## 2012-12-15 ENCOUNTER — Ambulatory Visit (INDEPENDENT_AMBULATORY_CARE_PROVIDER_SITE_OTHER): Payer: Medicare Other | Admitting: *Deleted

## 2012-12-15 DIAGNOSIS — I4891 Unspecified atrial fibrillation: Secondary | ICD-10-CM

## 2012-12-15 DIAGNOSIS — Z7901 Long term (current) use of anticoagulants: Secondary | ICD-10-CM | POA: Diagnosis not present

## 2012-12-15 NOTE — Telephone Encounter (Signed)
Pt came for INR today.

## 2012-12-19 ENCOUNTER — Ambulatory Visit (INDEPENDENT_AMBULATORY_CARE_PROVIDER_SITE_OTHER): Payer: Medicare Other | Admitting: *Deleted

## 2012-12-19 DIAGNOSIS — Z7901 Long term (current) use of anticoagulants: Secondary | ICD-10-CM | POA: Diagnosis not present

## 2012-12-19 DIAGNOSIS — I4891 Unspecified atrial fibrillation: Secondary | ICD-10-CM

## 2012-12-19 LAB — POCT INR: INR: 2.9

## 2012-12-22 ENCOUNTER — Ambulatory Visit (INDEPENDENT_AMBULATORY_CARE_PROVIDER_SITE_OTHER): Payer: Medicare Other | Admitting: Nurse Practitioner

## 2012-12-22 ENCOUNTER — Other Ambulatory Visit: Payer: Self-pay | Admitting: Nurse Practitioner

## 2012-12-22 ENCOUNTER — Encounter: Payer: Self-pay | Admitting: Nurse Practitioner

## 2012-12-22 VITALS — BP 122/68 | HR 84 | Ht 70.0 in | Wt 208.8 lb

## 2012-12-22 DIAGNOSIS — I4891 Unspecified atrial fibrillation: Secondary | ICD-10-CM | POA: Diagnosis not present

## 2012-12-22 DIAGNOSIS — Z01810 Encounter for preprocedural cardiovascular examination: Secondary | ICD-10-CM

## 2012-12-22 DIAGNOSIS — I2589 Other forms of chronic ischemic heart disease: Secondary | ICD-10-CM

## 2012-12-22 DIAGNOSIS — I255 Ischemic cardiomyopathy: Secondary | ICD-10-CM

## 2012-12-22 LAB — CBC WITH DIFFERENTIAL/PLATELET
Basophils Absolute: 0 10*3/uL (ref 0.0–0.1)
Basophils Relative: 0.5 % (ref 0.0–3.0)
Eosinophils Absolute: 0.1 10*3/uL (ref 0.0–0.7)
Eosinophils Relative: 1 % (ref 0.0–5.0)
HCT: 34.6 % — ABNORMAL LOW (ref 39.0–52.0)
Hemoglobin: 11.4 g/dL — ABNORMAL LOW (ref 13.0–17.0)
Lymphocytes Relative: 17.5 % (ref 12.0–46.0)
Lymphs Abs: 1 10*3/uL (ref 0.7–4.0)
MCHC: 32.8 g/dL (ref 30.0–36.0)
MCV: 86.1 fl (ref 78.0–100.0)
Monocytes Absolute: 0.5 10*3/uL (ref 0.1–1.0)
Monocytes Relative: 8.7 % (ref 3.0–12.0)
Neutro Abs: 4 10*3/uL (ref 1.4–7.7)
Neutrophils Relative %: 72.3 % (ref 43.0–77.0)
Platelets: 229 10*3/uL (ref 150.0–400.0)
RBC: 4.02 Mil/uL — ABNORMAL LOW (ref 4.22–5.81)
RDW: 15.5 % — ABNORMAL HIGH (ref 11.5–14.6)
WBC: 5.5 10*3/uL (ref 4.5–10.5)

## 2012-12-22 LAB — APTT: aPTT: 46.2 s — ABNORMAL HIGH (ref 21.7–28.8)

## 2012-12-22 LAB — PROTIME-INR
INR: 3.2 ratio — ABNORMAL HIGH (ref 0.8–1.0)
Prothrombin Time: 33 s — ABNORMAL HIGH (ref 10.2–12.4)

## 2012-12-22 LAB — BASIC METABOLIC PANEL
BUN: 12 mg/dL (ref 6–23)
CO2: 29 mEq/L (ref 19–32)
Calcium: 8.9 mg/dL (ref 8.4–10.5)
Chloride: 104 mEq/L (ref 96–112)
Creatinine, Ser: 1.5 mg/dL (ref 0.4–1.5)
GFR: 47.68 mL/min — ABNORMAL LOW (ref >60.00)
Glucose, Bld: 96 mg/dL (ref 70–99)
Potassium: 5.1 mEq/L (ref 3.5–5.1)
Sodium: 139 mEq/L (ref 135–145)
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
## 2012-12-22 NOTE — Patient Instructions (Addendum)
We are going to arrange for a cardioversion next week - to try and get your heart back in rhythm  Stay on your current medicines  We need to check labs today  I will see you in about 10 days - we may have to arrange for a heart catheterization  You are scheduled for a cardioversion on Thursday, March 27th at 12 pm with Dr. Patty Sermons or associates. Please go to [facility] 2nd Floor Short Stay at Thursday, March 27th at 10:30am Do not have any food or drink after midnight on Wednesday.  You may take your medicines with a sip of water on the day of your procedure.  You will need someone to drive you home following your procedure.    Call the [facility] office at [phone] if you have any questions, problems or concerns.

## 2012-12-26 ENCOUNTER — Ambulatory Visit (INDEPENDENT_AMBULATORY_CARE_PROVIDER_SITE_OTHER): Payer: Medicare Other | Admitting: *Deleted

## 2012-12-26 DIAGNOSIS — I4891 Unspecified atrial fibrillation: Secondary | ICD-10-CM

## 2012-12-26 DIAGNOSIS — Z7901 Long term (current) use of anticoagulants: Secondary | ICD-10-CM | POA: Diagnosis not present

## 2012-12-28 ENCOUNTER — Ambulatory Visit (HOSPITAL_COMMUNITY)
Admission: RE | Admit: 2012-12-28 | Discharge: 2012-12-28 | Disposition: A | Discharge status: Home Health | Payer: Medicare Other | Source: Ambulatory Visit | Attending: Cardiology | Admitting: Cardiology

## 2012-12-28 ENCOUNTER — Ambulatory Visit (HOSPITAL_COMMUNITY): Payer: Medicare Other | Admitting: Certified Registered"

## 2012-12-28 ENCOUNTER — Encounter (HOSPITAL_COMMUNITY)
Admission: RE | Disposition: A | Discharge status: Home Health | Payer: Self-pay | Source: Ambulatory Visit | Attending: Cardiology

## 2012-12-28 ENCOUNTER — Encounter (HOSPITAL_COMMUNITY): Payer: Self-pay | Admitting: *Deleted

## 2012-12-28 ENCOUNTER — Encounter (HOSPITAL_COMMUNITY): Payer: Self-pay | Admitting: Certified Registered"

## 2012-12-28 DIAGNOSIS — I4891 Unspecified atrial fibrillation: Secondary | ICD-10-CM | POA: Diagnosis not present

## 2012-12-28 DIAGNOSIS — I4892 Unspecified atrial flutter: Secondary | ICD-10-CM | POA: Diagnosis not present

## 2012-12-28 DIAGNOSIS — I10 Essential (primary) hypertension: Secondary | ICD-10-CM | POA: Insufficient documentation

## 2012-12-28 DIAGNOSIS — I2589 Other forms of chronic ischemic heart disease: Secondary | ICD-10-CM | POA: Insufficient documentation

## 2012-12-28 DIAGNOSIS — R259 Unspecified abnormal involuntary movements: Secondary | ICD-10-CM | POA: Insufficient documentation

## 2012-12-28 DIAGNOSIS — I2510 Atherosclerotic heart disease of native coronary artery without angina pectoris: Secondary | ICD-10-CM | POA: Insufficient documentation

## 2012-12-28 DIAGNOSIS — N400 Enlarged prostate without lower urinary tract symptoms: Secondary | ICD-10-CM | POA: Insufficient documentation

## 2012-12-28 DIAGNOSIS — Z01810 Encounter for preprocedural cardiovascular examination: Secondary | ICD-10-CM

## 2012-12-28 DIAGNOSIS — K219 Gastro-esophageal reflux disease without esophagitis: Secondary | ICD-10-CM | POA: Insufficient documentation

## 2012-12-28 DIAGNOSIS — I252 Old myocardial infarction: Secondary | ICD-10-CM | POA: Insufficient documentation

## 2012-12-28 DIAGNOSIS — F411 Generalized anxiety disorder: Secondary | ICD-10-CM | POA: Insufficient documentation

## 2012-12-28 DIAGNOSIS — E785 Hyperlipidemia, unspecified: Secondary | ICD-10-CM | POA: Diagnosis not present

## 2012-12-28 DIAGNOSIS — Z79899 Other long term (current) drug therapy: Secondary | ICD-10-CM | POA: Diagnosis not present

## 2012-12-28 DIAGNOSIS — E119 Type 2 diabetes mellitus without complications: Secondary | ICD-10-CM | POA: Diagnosis not present

## 2012-12-28 DIAGNOSIS — Z8701 Personal history of pneumonia (recurrent): Secondary | ICD-10-CM | POA: Insufficient documentation

## 2012-12-28 HISTORY — PX: CARDIOVERSION: SHX1299

## 2012-12-28 LAB — PROTIME-INR: Prothrombin Time: 32.6 seconds — ABNORMAL HIGH (ref 11.6–15.2)

## 2012-12-28 SURGERY — CARDIOVERSION
Anesthesia: Monitor Anesthesia Care

## 2012-12-28 MED ORDER — ETOMIDATE 2 MG/ML IV SOLN
INTRAVENOUS | Status: DC | PRN
  Administered 2012-12-28: 30 mg via INTRAVENOUS

## 2012-12-28 MED ORDER — LIDOCAINE HCL 1 % IJ SOLN
INTRAMUSCULAR | Status: DC | PRN
  Administered 2012-12-28: 60 mg via INTRADERMAL

## 2012-12-28 MED ORDER — DEXTROSE-NACL 5-0.45 % IV SOLN
INTRAVENOUS | Status: DC
  Administered 2012-12-28: 500 mL via INTRAVENOUS

## 2012-12-28 MED ORDER — LIDOCAINE HCL (CARDIAC) 20 MG/ML IV SOLN
INTRAVENOUS | Status: DC | PRN
  Administered 2012-12-28: 60 mg via INTRAVENOUS
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
## 2012-12-28 NOTE — Transfer of Care (Signed)
Immediate Anesthesia Transfer of Care Note  Patient: [name]  Procedure(s) Performed: Procedure(s): CARDIOVERSION (N/A)  Patient Location: Endoscopy Unit  Anesthesia Type:General  Level of Consciousness: sedated  Airway & Oxygen Therapy: Patient Spontanous Breathing and Patient connected to nasal cannula oxygen  Post-op Assessment: Report given to PACU RN and Post -op Vital signs reviewed and stable  Post vital signs: Reviewed and stable  Complications: No apparent anesthesia complications

## 2012-12-28 NOTE — Anesthesia Preprocedure Evaluation (Addendum)
Anesthesia Evaluation  Patient identified by MRN, date of birth, ID band Patient awake    Reviewed: Allergy & Precautions, H&P , NPO status , Patient's Chart, lab work & pertinent test results  Airway Mallampati: II TM Distance: >3 FB Neck ROM: Full    Dental  (+) Missing, Dental Advisory Given and Poor Dentition   Pulmonary shortness of breath, pneumonia -, resolved,          Cardiovascular hypertension, Pt. on medications + CAD and + Past MI + dysrhythmias Atrial Fibrillation Rhythm:Irregular     Neuro/Psych    GI/Hepatic GERD-  Medicated and Controlled,  Endo/Other  diabetes, Well Controlled, Type 2  Renal/GU      Musculoskeletal   Abdominal   Peds  Hematology   Anesthesia Other Findings   Reproductive/Obstetrics                           Anesthesia Physical Anesthesia Plan  ASA: III  Anesthesia Plan: General   Post-op Pain Management:    Induction: Intravenous  Airway Management Planned: Mask and Natural Airway  Additional Equipment:   Intra-op Plan:   Post-operative Plan:   Informed Consent: I have reviewed the patients History and Physical, chart, labs and discussed the procedure including the risks, benefits and alternatives for the proposed anesthesia with the patient or authorized representative who has indicated his/her understanding and acceptance.   Dental advisory given  Plan Discussed with: CRNA, Anesthesiologist and Surgeon  Anesthesia Plan Comments:         Anesthesia Quick Evaluation

## 2012-12-28 NOTE — CV Procedure (Signed)
Electrical Cardioversion Procedure Note [name] [id number] [birth year]  Procedure: Electrical Cardioversion Indications:  Atrial Flutter  Procedure Details Consent: Risks of procedure as well as the alternatives and risks of each were explained to the (patient/caregiver).  Consent for procedure obtained. Time Out: Verified patient identification, verified procedure, site/side was marked, verified correct patient position, special equipment/implants available, medications/allergies/relevent history reviewed, required imaging and test results available.  Performed  Patient placed on cardiac monitor, pulse oximetry, supplemental oxygen as necessary.  Sedation given: Etomidate and propofol Pacer pads placed anterior and posterior chest.  Cardioverted 1 time(s).  Cardioverted at 120J.  Evaluation Findings: Post procedure EKG shows: NSR Complications: None Patient did tolerate procedure well.   Cassell Clement 12/28/2012, 12:26 PM

## 2012-12-29 ENCOUNTER — Encounter (HOSPITAL_COMMUNITY): Payer: Self-pay | Admitting: Cardiology

## 2012-12-30 ENCOUNTER — Other Ambulatory Visit: Payer: Self-pay | Admitting: Cardiology

## 2013-01-01 ENCOUNTER — Telehealth: Payer: Self-pay | Admitting: Cardiology

## 2013-01-01 ENCOUNTER — Ambulatory Visit: Payer: Medicare Other | Admitting: Nurse Practitioner

## 2013-01-01 NOTE — Telephone Encounter (Signed)
Called patient's wife back after consultation with TB/MP.  Dr. Patty Sermons recommends patient remain on Amiodarone 200 mg BID until appointment w/Lori Tyrone Sage, NP on 4/17.

## 2013-01-01 NOTE — Telephone Encounter (Signed)
New problem     Need to discuss medication

## 2013-01-02 NOTE — Anesthesia Postprocedure Evaluation (Signed)
Anesthesia Post Note  Patient: [name]  Procedure(s) Performed: Procedure(s) (LRB): CARDIOVERSION (N/A)  Anesthesia type: General  Patient location: PACU  Post pain: Pain level controlled and Adequate analgesia  Post assessment: Post-op Vital signs reviewed, Patient's Cardiovascular Status Stable, Respiratory Function Stable, Patent Airway and Pain level controlled  Last Vitals:  Filed Vitals:   12/28/12 1320  BP: 128/79  Pulse:   Temp:   Resp: 21    Post vital signs: Reviewed and stable  Level of consciousness: awake, alert  and oriented  Complications: No apparent anesthesia complications

## 2013-01-05 ENCOUNTER — Ambulatory Visit (INDEPENDENT_AMBULATORY_CARE_PROVIDER_SITE_OTHER): Payer: Medicare Other | Admitting: *Deleted

## 2013-01-05 DIAGNOSIS — I4891 Unspecified atrial fibrillation: Secondary | ICD-10-CM

## 2013-01-05 DIAGNOSIS — Z7901 Long term (current) use of anticoagulants: Secondary | ICD-10-CM

## 2013-01-12 ENCOUNTER — Ambulatory Visit (INDEPENDENT_AMBULATORY_CARE_PROVIDER_SITE_OTHER): Payer: Medicare Other | Admitting: *Deleted

## 2013-01-12 DIAGNOSIS — Z7901 Long term (current) use of anticoagulants: Secondary | ICD-10-CM | POA: Diagnosis not present

## 2013-01-12 DIAGNOSIS — I4891 Unspecified atrial fibrillation: Secondary | ICD-10-CM

## 2013-01-12 LAB — POCT INR: INR: 2.2

## 2013-01-18 ENCOUNTER — Ambulatory Visit (INDEPENDENT_AMBULATORY_CARE_PROVIDER_SITE_OTHER): Payer: Medicare Other | Admitting: Nurse Practitioner

## 2013-01-18 ENCOUNTER — Encounter: Payer: Self-pay | Admitting: Nurse Practitioner

## 2013-01-18 VITALS — BP 140/78 | HR 52 | Ht 70.0 in | Wt 197.0 lb

## 2013-01-18 DIAGNOSIS — Z79899 Other long term (current) drug therapy: Secondary | ICD-10-CM

## 2013-01-18 DIAGNOSIS — I4891 Unspecified atrial fibrillation: Secondary | ICD-10-CM

## 2013-01-18 LAB — CBC WITH DIFFERENTIAL/PLATELET
Basophils Absolute: 0.1 10*3/uL (ref 0.0–0.1)
Basophils Relative: 1 % (ref 0.0–3.0)
Eosinophils Absolute: 0 10*3/uL (ref 0.0–0.7)
Eosinophils Relative: 0.6 % (ref 0.0–5.0)
HCT: 33.4 % — ABNORMAL LOW (ref 39.0–52.0)
Hemoglobin: 11.1 g/dL — ABNORMAL LOW (ref 13.0–17.0)
Lymphocytes Relative: 16.4 % (ref 12.0–46.0)
Lymphs Abs: 0.9 10*3/uL (ref 0.7–4.0)
MCHC: 33.3 g/dL (ref 30.0–36.0)
MCV: 84.8 fl (ref 78.0–100.0)
Monocytes Absolute: 0.4 10*3/uL (ref 0.1–1.0)
Monocytes Relative: 7.8 % (ref 3.0–12.0)
Neutro Abs: 4 10*3/uL (ref 1.4–7.7)
Neutrophils Relative %: 74.2 % (ref 43.0–77.0)
Platelets: 208 10*3/uL (ref 150.0–400.0)
RBC: 3.94 Mil/uL — ABNORMAL LOW (ref 4.22–5.81)
RDW: 16.4 % — ABNORMAL HIGH (ref 11.5–14.6)
WBC: 5.4 10*3/uL (ref 4.5–10.5)

## 2013-01-18 LAB — BASIC METABOLIC PANEL
BUN: 16 mg/dL (ref 6–23)
CO2: 28 mEq/L (ref 19–32)
Calcium: 9 mg/dL (ref 8.4–10.5)
Chloride: 103 mEq/L (ref 96–112)
Creatinine, Ser: 1.6 mg/dL — ABNORMAL HIGH (ref 0.4–1.5)
GFR: 46.61 mL/min — ABNORMAL LOW (ref >60.00)
Glucose, Bld: 97 mg/dL (ref 70–99)
Potassium: 4.9 mEq/L (ref 3.5–5.1)
Sodium: 139 mEq/L (ref 135–145)

## 2013-01-18 LAB — TSH: TSH: 0.49 u[IU]/mL (ref 0.35–5.50)

## 2013-01-18 MED ORDER — AMIODARONE HCL 200 MG PO TABS: 200.0000 mg | ORAL_TABLET | Freq: Two times a day (BID) | ORAL | Status: DC

## 2013-01-18 NOTE — Addendum Note (Signed)
Addended by: Regis Bill B on: 01/18/2013 12:22 PM   Modules accepted: Orders
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
## 2013-01-18 NOTE — Patient Instructions (Signed)
Stay on your current medicines  Except cut the amiodarone back to just 200 mg just once a day  Dr. Graciela Husbands and I will see you in month  Call the [facility] office at [phone] if you have any questions, problems or concerns.

## 2013-01-19 ENCOUNTER — Ambulatory Visit (INDEPENDENT_AMBULATORY_CARE_PROVIDER_SITE_OTHER): Payer: Medicare Other | Admitting: *Deleted

## 2013-01-19 DIAGNOSIS — Z7901 Long term (current) use of anticoagulants: Secondary | ICD-10-CM

## 2013-01-19 DIAGNOSIS — I4891 Unspecified atrial fibrillation: Secondary | ICD-10-CM

## 2013-01-26 ENCOUNTER — Ambulatory Visit (INDEPENDENT_AMBULATORY_CARE_PROVIDER_SITE_OTHER): Payer: Medicare Other | Admitting: *Deleted

## 2013-01-26 DIAGNOSIS — I4891 Unspecified atrial fibrillation: Secondary | ICD-10-CM | POA: Diagnosis not present

## 2013-01-26 DIAGNOSIS — Z7901 Long term (current) use of anticoagulants: Secondary | ICD-10-CM

## 2013-01-26 LAB — POCT INR: INR: 3.6

## 2013-01-30 ENCOUNTER — Telehealth: Payer: Self-pay | Admitting: *Deleted

## 2013-01-30 NOTE — Telephone Encounter (Signed)
Patient never called back, left results on recorder

## 2013-01-30 NOTE — Telephone Encounter (Signed)
Message copied by Burnell Blanks on Tue Jan 30, 2013 10:47 AM ------      Message from: Rosalio Macadamia      Created: Thu Jan 18, 2013  4:04 PM       Ok to report. Labs are satisfactory. ------

## 2013-02-13 ENCOUNTER — Ambulatory Visit (INDEPENDENT_AMBULATORY_CARE_PROVIDER_SITE_OTHER): Payer: Medicare Other | Admitting: *Deleted

## 2013-02-13 DIAGNOSIS — I4891 Unspecified atrial fibrillation: Secondary | ICD-10-CM

## 2013-02-13 DIAGNOSIS — Z7901 Long term (current) use of anticoagulants: Secondary | ICD-10-CM | POA: Diagnosis not present

## 2013-02-20 ENCOUNTER — Ambulatory Visit: Payer: Medicare Other | Admitting: Nurse Practitioner

## 2013-02-20 DIAGNOSIS — E039 Hypothyroidism, unspecified: Secondary | ICD-10-CM | POA: Diagnosis not present

## 2013-02-20 DIAGNOSIS — I509 Heart failure, unspecified: Secondary | ICD-10-CM | POA: Diagnosis not present

## 2013-02-20 DIAGNOSIS — I10 Essential (primary) hypertension: Secondary | ICD-10-CM | POA: Diagnosis not present

## 2013-02-20 DIAGNOSIS — D509 Iron deficiency anemia, unspecified: Secondary | ICD-10-CM | POA: Diagnosis not present

## 2013-02-20 DIAGNOSIS — M549 Dorsalgia, unspecified: Secondary | ICD-10-CM | POA: Diagnosis not present

## 2013-02-20 DIAGNOSIS — E119 Type 2 diabetes mellitus without complications: Secondary | ICD-10-CM | POA: Diagnosis not present

## 2013-02-20 DIAGNOSIS — Z6829 Body mass index (BMI) 29.0-29.9, adult: Secondary | ICD-10-CM | POA: Diagnosis not present

## 2013-02-20 DIAGNOSIS — I4891 Unspecified atrial fibrillation: Secondary | ICD-10-CM | POA: Diagnosis not present

## 2013-02-27 ENCOUNTER — Encounter: Payer: Self-pay | Admitting: Physician Assistant

## 2013-02-27 ENCOUNTER — Ambulatory Visit (INDEPENDENT_AMBULATORY_CARE_PROVIDER_SITE_OTHER): Payer: Medicare Other | Admitting: Physician Assistant

## 2013-02-27 VITALS — BP 150/78 | HR 75 | Ht 70.5 in | Wt 204.0 lb

## 2013-02-27 DIAGNOSIS — I2589 Other forms of chronic ischemic heart disease: Secondary | ICD-10-CM | POA: Diagnosis not present

## 2013-02-27 DIAGNOSIS — I4891 Unspecified atrial fibrillation: Secondary | ICD-10-CM | POA: Diagnosis not present

## 2013-02-27 DIAGNOSIS — I5022 Chronic systolic (congestive) heart failure: Secondary | ICD-10-CM

## 2013-02-27 DIAGNOSIS — E785 Hyperlipidemia, unspecified: Secondary | ICD-10-CM

## 2013-02-27 DIAGNOSIS — I2510 Atherosclerotic heart disease of native coronary artery without angina pectoris: Secondary | ICD-10-CM

## 2013-02-27 DIAGNOSIS — I10 Essential (primary) hypertension: Secondary | ICD-10-CM

## 2013-02-27 NOTE — Patient Instructions (Addendum)
PLEASE SCHEDULE ECHO DX 427.31, 428.22  PLEASE FOLLOW UP WITH LORI GERHARDT, NP IN ABOUT 2 WEEKS  NO CHANGES IN MEDICATIONS
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
## 2013-02-28 ENCOUNTER — Other Ambulatory Visit: Payer: Self-pay

## 2013-02-28 ENCOUNTER — Other Ambulatory Visit (INDEPENDENT_AMBULATORY_CARE_PROVIDER_SITE_OTHER): Payer: Medicare Other

## 2013-02-28 DIAGNOSIS — I2510 Atherosclerotic heart disease of native coronary artery without angina pectoris: Secondary | ICD-10-CM

## 2013-02-28 DIAGNOSIS — I5022 Chronic systolic (congestive) heart failure: Secondary | ICD-10-CM

## 2013-02-28 DIAGNOSIS — I4891 Unspecified atrial fibrillation: Secondary | ICD-10-CM | POA: Diagnosis not present

## 2013-03-06 ENCOUNTER — Ambulatory Visit (INDEPENDENT_AMBULATORY_CARE_PROVIDER_SITE_OTHER): Payer: Medicare Other | Admitting: *Deleted

## 2013-03-06 DIAGNOSIS — Z7901 Long term (current) use of anticoagulants: Secondary | ICD-10-CM

## 2013-03-06 DIAGNOSIS — I4891 Unspecified atrial fibrillation: Secondary | ICD-10-CM

## 2013-03-06 LAB — POCT INR: INR: 1.9

## 2013-03-07 ENCOUNTER — Encounter: Payer: Self-pay | Admitting: Physician Assistant

## 2013-03-08 ENCOUNTER — Telehealth: Payer: Self-pay | Admitting: *Deleted

## 2013-03-08 NOTE — Telephone Encounter (Signed)
pt's wife notified about echo results and verablized understanding today to results and to keep appt w/LG, NP 6/16 to discuss echo results in detail and possible cath needed.

## 2013-03-08 NOTE — Telephone Encounter (Signed)
Message copied by Tarri Fuller on Thu Mar 08, 2013  9:12 AM ------      Message from: [place], [place] T      Created: Wed Mar 07, 2013  1:40 PM       EF remains down at 20-25%.      [name] has a f/u with Norma Fredrickson, NP soon.      Keep that appt.      Will likely need to consider proceeding with cardiac cath as previously recommended by Dr. Graciela Husbands.      This can be discussed at office visit 6/16.      Tereso Newcomer, PA-C        03/07/2013 1:40 PM ------

## 2013-03-19 ENCOUNTER — Encounter: Payer: Self-pay | Admitting: Nurse Practitioner

## 2013-03-19 ENCOUNTER — Ambulatory Visit (INDEPENDENT_AMBULATORY_CARE_PROVIDER_SITE_OTHER): Payer: Medicare Other | Admitting: Nurse Practitioner

## 2013-03-19 VITALS — BP 130/66 | HR 76 | Ht 70.5 in | Wt 203.0 lb

## 2013-03-19 DIAGNOSIS — I2589 Other forms of chronic ischemic heart disease: Secondary | ICD-10-CM

## 2013-03-19 DIAGNOSIS — Z79899 Other long term (current) drug therapy: Secondary | ICD-10-CM

## 2013-03-19 DIAGNOSIS — IMO0001 Reserved for inherently not codable concepts without codable children: Secondary | ICD-10-CM | POA: Diagnosis not present

## 2013-03-19 DIAGNOSIS — I255 Ischemic cardiomyopathy: Secondary | ICD-10-CM

## 2013-03-19 DIAGNOSIS — M791 Myalgia, unspecified site: Secondary | ICD-10-CM

## 2013-03-19 LAB — SEDIMENTATION RATE: Sed Rate: 16 mm/hr (ref 0–22)

## 2013-03-19 LAB — BASIC METABOLIC PANEL
BUN: 15 mg/dL (ref 6–23)
CO2: 23 mEq/L (ref 19–32)
Calcium: 9.4 mg/dL (ref 8.4–10.5)
Chloride: 101 mEq/L (ref 96–112)
Creatinine, Ser: 1.6 mg/dL — ABNORMAL HIGH (ref 0.4–1.5)
GFR: 45.57 mL/min — ABNORMAL LOW (ref >60.00)
Glucose, Bld: 82 mg/dL (ref 70–99)
Potassium: 4.5 mEq/L (ref 3.5–5.1)
Sodium: 137 mEq/L (ref 135–145)

## 2013-03-19 LAB — CBC WITH DIFFERENTIAL/PLATELET
Basophils Absolute: 0 10*3/uL (ref 0.0–0.1)
Basophils Relative: 0.5 % (ref 0.0–3.0)
Eosinophils Absolute: 0.1 10*3/uL (ref 0.0–0.7)
Eosinophils Relative: 1.1 % (ref 0.0–5.0)
HCT: 38.4 % — ABNORMAL LOW (ref 39.0–52.0)
Hemoglobin: 12.8 g/dL — ABNORMAL LOW (ref 13.0–17.0)
Lymphocytes Relative: 22.4 % (ref 12.0–46.0)
Lymphs Abs: 1.5 10*3/uL (ref 0.7–4.0)
MCHC: 33.2 g/dL (ref 30.0–36.0)
MCV: 85.2 fl (ref 78.0–100.0)
Monocytes Absolute: 0.6 10*3/uL (ref 0.1–1.0)
Monocytes Relative: 9.1 % (ref 3.0–12.0)
Neutro Abs: 4.4 10*3/uL (ref 1.4–7.7)
Neutrophils Relative %: 66.9 % (ref 43.0–77.0)
Platelets: 236 10*3/uL (ref 150.0–400.0)
RBC: 4.5 Mil/uL (ref 4.22–5.81)
RDW: 19.2 % — ABNORMAL HIGH (ref 11.5–14.6)
WBC: 6.6 10*3/uL (ref 4.5–10.5)

## 2013-03-19 LAB — HEPATIC FUNCTION PANEL
ALT: 13 U/L (ref 0–53)
AST: 18 U/L (ref 0–37)
Albumin: 4.3 g/dL (ref 3.5–5.2)
Alkaline Phosphatase: 35 U/L — ABNORMAL LOW (ref 39–117)
Bilirubin, Direct: 0.1 mg/dL (ref 0.0–0.3)
Total Bilirubin: 0.7 mg/dL (ref 0.3–1.2)
Total Protein: 7.4 g/dL (ref 6.0–8.3)

## 2013-03-19 LAB — CK: Total CK: 73 U/L (ref 7–232)

## 2013-03-19 LAB — TSH: TSH: 2.41 u[IU]/mL (ref 0.35–5.50)

## 2013-03-19 NOTE — Patient Instructions (Addendum)
Stop your cholesterol medicine and the saw palmetto  Go by this medicine list and call us if it doesn't match up  I will see you in a month  We are checking labs today  Call the [facility] office at [phone] if you have any questions, problems or concerns.
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
## 2013-03-23 DIAGNOSIS — H40029 Open angle with borderline findings, high risk, unspecified eye: Secondary | ICD-10-CM | POA: Diagnosis not present

## 2013-03-23 DIAGNOSIS — Z961 Presence of intraocular lens: Secondary | ICD-10-CM | POA: Diagnosis not present

## 2013-03-23 DIAGNOSIS — E119 Type 2 diabetes mellitus without complications: Secondary | ICD-10-CM | POA: Diagnosis not present

## 2013-03-23 DIAGNOSIS — H40019 Open angle with borderline findings, low risk, unspecified eye: Secondary | ICD-10-CM | POA: Diagnosis not present

## 2013-03-27 ENCOUNTER — Ambulatory Visit (INDEPENDENT_AMBULATORY_CARE_PROVIDER_SITE_OTHER): Payer: Medicare Other | Admitting: *Deleted

## 2013-03-27 DIAGNOSIS — Z7901 Long term (current) use of anticoagulants: Secondary | ICD-10-CM

## 2013-03-27 DIAGNOSIS — I4891 Unspecified atrial fibrillation: Secondary | ICD-10-CM | POA: Diagnosis not present

## 2013-03-31 ENCOUNTER — Other Ambulatory Visit: Payer: Self-pay | Admitting: Cardiology

## 2013-04-02 NOTE — Telephone Encounter (Signed)
Please refill.

## 2013-04-16 ENCOUNTER — Encounter: Payer: Self-pay | Admitting: Nurse Practitioner

## 2013-04-16 ENCOUNTER — Ambulatory Visit (INDEPENDENT_AMBULATORY_CARE_PROVIDER_SITE_OTHER): Payer: Medicare Other | Admitting: Nurse Practitioner

## 2013-04-16 VITALS — BP 140/62 | HR 48 | Ht 70.5 in | Wt 200.8 lb

## 2013-04-16 DIAGNOSIS — M549 Dorsalgia, unspecified: Secondary | ICD-10-CM | POA: Diagnosis not present

## 2013-04-16 DIAGNOSIS — I2589 Other forms of chronic ischemic heart disease: Secondary | ICD-10-CM

## 2013-04-16 DIAGNOSIS — I255 Ischemic cardiomyopathy: Secondary | ICD-10-CM

## 2013-04-16 MED ORDER — ATORVASTATIN CALCIUM 10 MG PO TABS: 10.0000 mg | ORAL_TABLET | Freq: Every day | ORAL | Status: DC

## 2013-04-16 NOTE — Patient Instructions (Addendum)
You may restart your Lipitor at 10 mg a day  You may restart your saw palmetto  We will get you an appointment with Dr. Ophelia Charter about your orthopedic issues  I will see you in 2 months  Call the [facility] office at [phone] if you have any questions, problems or concerns.
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
## 2013-04-24 ENCOUNTER — Ambulatory Visit (INDEPENDENT_AMBULATORY_CARE_PROVIDER_SITE_OTHER): Payer: Medicare Other | Admitting: *Deleted

## 2013-04-24 DIAGNOSIS — Z7901 Long term (current) use of anticoagulants: Secondary | ICD-10-CM

## 2013-04-24 DIAGNOSIS — I4891 Unspecified atrial fibrillation: Secondary | ICD-10-CM | POA: Diagnosis not present

## 2013-04-24 LAB — POCT INR: INR: 2.8

## 2013-04-28 ENCOUNTER — Other Ambulatory Visit: Payer: Self-pay | Admitting: Cardiology

## 2013-05-08 DIAGNOSIS — M48061 Spinal stenosis, lumbar region without neurogenic claudication: Secondary | ICD-10-CM | POA: Diagnosis not present

## 2013-05-08 DIAGNOSIS — M5126 Other intervertebral disc displacement, lumbar region: Secondary | ICD-10-CM | POA: Diagnosis not present

## 2013-05-29 ENCOUNTER — Ambulatory Visit (INDEPENDENT_AMBULATORY_CARE_PROVIDER_SITE_OTHER): Payer: Medicare Other | Admitting: *Deleted

## 2013-05-29 DIAGNOSIS — Z7901 Long term (current) use of anticoagulants: Secondary | ICD-10-CM

## 2013-05-29 DIAGNOSIS — I4891 Unspecified atrial fibrillation: Secondary | ICD-10-CM

## 2013-05-29 LAB — POCT INR: INR: 2.4

## 2013-05-31 ENCOUNTER — Other Ambulatory Visit: Payer: Self-pay | Admitting: Orthopaedic Surgery

## 2013-05-31 DIAGNOSIS — M48061 Spinal stenosis, lumbar region without neurogenic claudication: Secondary | ICD-10-CM | POA: Diagnosis not present

## 2013-05-31 DIAGNOSIS — R531 Weakness: Secondary | ICD-10-CM

## 2013-05-31 DIAGNOSIS — E119 Type 2 diabetes mellitus without complications: Secondary | ICD-10-CM | POA: Diagnosis not present

## 2013-05-31 DIAGNOSIS — M545 Low back pain: Secondary | ICD-10-CM

## 2013-05-31 DIAGNOSIS — M5126 Other intervertebral disc displacement, lumbar region: Secondary | ICD-10-CM | POA: Diagnosis not present

## 2013-05-31 DIAGNOSIS — I519 Heart disease, unspecified: Secondary | ICD-10-CM | POA: Diagnosis not present

## 2013-05-31 DIAGNOSIS — Z01812 Encounter for preprocedural laboratory examination: Secondary | ICD-10-CM | POA: Diagnosis not present

## 2013-05-31 DIAGNOSIS — I10 Essential (primary) hypertension: Secondary | ICD-10-CM | POA: Diagnosis not present

## 2013-06-11 ENCOUNTER — Ambulatory Visit
Admission: RE | Admit: 2013-06-11 | Discharge: 2013-06-11 | Disposition: A | Discharge status: Home / Self Care | Payer: Medicare Other | Source: Ambulatory Visit | Attending: Orthopaedic Surgery | Admitting: Orthopaedic Surgery

## 2013-06-11 DIAGNOSIS — M545 Low back pain: Secondary | ICD-10-CM

## 2013-06-11 DIAGNOSIS — R531 Weakness: Secondary | ICD-10-CM

## 2013-06-11 DIAGNOSIS — M5126 Other intervertebral disc displacement, lumbar region: Secondary | ICD-10-CM | POA: Diagnosis not present

## 2013-06-11 DIAGNOSIS — M48061 Spinal stenosis, lumbar region without neurogenic claudication: Secondary | ICD-10-CM | POA: Diagnosis not present

## 2013-06-11 DIAGNOSIS — M47817 Spondylosis without myelopathy or radiculopathy, lumbosacral region: Secondary | ICD-10-CM | POA: Diagnosis not present

## 2013-06-11 MED ORDER — GADOBENATE DIMEGLUMINE 529 MG/ML IV SOLN: 10.0000 mL | Freq: Once | INTRAVENOUS | Status: AC | PRN

## 2013-06-14 DIAGNOSIS — M545 Low back pain: Secondary | ICD-10-CM | POA: Diagnosis not present

## 2013-06-18 ENCOUNTER — Other Ambulatory Visit: Payer: Self-pay | Admitting: *Deleted

## 2013-06-18 ENCOUNTER — Encounter: Payer: Self-pay | Admitting: Nurse Practitioner

## 2013-06-18 ENCOUNTER — Ambulatory Visit (INDEPENDENT_AMBULATORY_CARE_PROVIDER_SITE_OTHER): Payer: Medicare Other | Admitting: Nurse Practitioner

## 2013-06-18 VITALS — BP 150/70 | HR 54 | Ht 70.0 in | Wt 207.2 lb

## 2013-06-18 DIAGNOSIS — I2589 Other forms of chronic ischemic heart disease: Secondary | ICD-10-CM

## 2013-06-18 DIAGNOSIS — I255 Ischemic cardiomyopathy: Secondary | ICD-10-CM

## 2013-06-18 DIAGNOSIS — Z79899 Other long term (current) drug therapy: Secondary | ICD-10-CM | POA: Diagnosis not present

## 2013-06-18 DIAGNOSIS — E785 Hyperlipidemia, unspecified: Secondary | ICD-10-CM | POA: Diagnosis not present

## 2013-06-18 LAB — CBC WITH DIFFERENTIAL/PLATELET
Basophils Absolute: 0 10*3/uL (ref 0.0–0.1)
Basophils Relative: 0.5 % (ref 0.0–3.0)
Eosinophils Absolute: 0.1 10*3/uL (ref 0.0–0.7)
Eosinophils Relative: 2.5 % (ref 0.0–5.0)
HCT: 37.9 % — ABNORMAL LOW (ref 39.0–52.0)
Hemoglobin: 13.1 g/dL (ref 13.0–17.0)
Lymphocytes Relative: 20.6 % (ref 12.0–46.0)
Lymphs Abs: 1.2 10*3/uL (ref 0.7–4.0)
MCHC: 34.5 g/dL (ref 30.0–36.0)
MCV: 89.5 fl (ref 78.0–100.0)
Monocytes Absolute: 0.5 10*3/uL (ref 0.1–1.0)
Monocytes Relative: 8.2 % (ref 3.0–12.0)
Neutro Abs: 4.1 10*3/uL (ref 1.4–7.7)
Neutrophils Relative %: 68.2 % (ref 43.0–77.0)
Platelets: 185 10*3/uL (ref 150.0–400.0)
RBC: 4.24 Mil/uL (ref 4.22–5.81)
RDW: 16 % — ABNORMAL HIGH (ref 11.5–14.6)
WBC: 6 10*3/uL (ref 4.5–10.5)

## 2013-06-18 LAB — LIPID PANEL
Cholesterol: 167 mg/dL (ref 0–200)
HDL: 31.1 mg/dL — ABNORMAL LOW (ref >39.00)
LDL Cholesterol: 98 mg/dL (ref 0–99)
Total CHOL/HDL Ratio: 5
Triglycerides: 192 mg/dL — ABNORMAL HIGH (ref 0.0–149.0)
VLDL: 38.4 mg/dL (ref 0.0–40.0)

## 2013-06-18 LAB — HEPATIC FUNCTION PANEL
ALT: 14 U/L (ref 0–53)
AST: 19 U/L (ref 0–37)
Albumin: 4.2 g/dL (ref 3.5–5.2)
Alkaline Phosphatase: 39 U/L (ref 39–117)
Bilirubin, Direct: 0.1 mg/dL (ref 0.0–0.3)
Total Bilirubin: 0.8 mg/dL (ref 0.3–1.2)
Total Protein: 6.7 g/dL (ref 6.0–8.3)

## 2013-06-18 LAB — BASIC METABOLIC PANEL
BUN: 14 mg/dL (ref 6–23)
CO2: 28 mEq/L (ref 19–32)
Calcium: 8.9 mg/dL (ref 8.4–10.5)
Chloride: 103 mEq/L (ref 96–112)
Creatinine, Ser: 1.5 mg/dL (ref 0.4–1.5)
GFR: 46.91 mL/min — ABNORMAL LOW (ref >60.00)
Glucose, Bld: 90 mg/dL (ref 70–99)
Potassium: 4.3 mEq/L (ref 3.5–5.1)
Sodium: 136 mEq/L (ref 135–145)

## 2013-06-18 LAB — TSH: TSH: 5.96 u[IU]/mL — ABNORMAL HIGH (ref 0.35–5.50)

## 2013-06-18 MED ORDER — LEVOTHYROXINE SODIUM 50 MCG PO TABS: ORAL_TABLET | ORAL | Status: DC

## 2013-06-18 NOTE — Patient Instructions (Addendum)
I will see you in 6 weeks  Stay on your current medicines  Try to be more active  Call the [facility] office at [phone] if you have any questions, problems or concerns.
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
## 2013-06-19 ENCOUNTER — Encounter: Payer: Self-pay | Admitting: Gastroenterology

## 2013-06-19 DIAGNOSIS — I4891 Unspecified atrial fibrillation: Secondary | ICD-10-CM | POA: Diagnosis not present

## 2013-06-19 DIAGNOSIS — D649 Anemia, unspecified: Secondary | ICD-10-CM | POA: Diagnosis not present

## 2013-06-19 DIAGNOSIS — F411 Generalized anxiety disorder: Secondary | ICD-10-CM | POA: Diagnosis not present

## 2013-06-19 DIAGNOSIS — E039 Hypothyroidism, unspecified: Secondary | ICD-10-CM | POA: Diagnosis not present

## 2013-06-19 DIAGNOSIS — I2510 Atherosclerotic heart disease of native coronary artery without angina pectoris: Secondary | ICD-10-CM | POA: Diagnosis not present

## 2013-06-19 DIAGNOSIS — Z23 Encounter for immunization: Secondary | ICD-10-CM | POA: Diagnosis not present

## 2013-06-19 DIAGNOSIS — I509 Heart failure, unspecified: Secondary | ICD-10-CM | POA: Diagnosis not present

## 2013-06-19 DIAGNOSIS — E119 Type 2 diabetes mellitus without complications: Secondary | ICD-10-CM | POA: Diagnosis not present

## 2013-06-19 DIAGNOSIS — M549 Dorsalgia, unspecified: Secondary | ICD-10-CM | POA: Diagnosis not present

## 2013-06-21 ENCOUNTER — Encounter: Payer: Self-pay | Admitting: Nurse Practitioner

## 2013-06-24 ENCOUNTER — Other Ambulatory Visit: Payer: Self-pay | Admitting: Cardiology

## 2013-06-26 ENCOUNTER — Ambulatory Visit (INDEPENDENT_AMBULATORY_CARE_PROVIDER_SITE_OTHER): Payer: Medicare Other | Admitting: *Deleted

## 2013-06-26 DIAGNOSIS — Z7901 Long term (current) use of anticoagulants: Secondary | ICD-10-CM

## 2013-06-26 DIAGNOSIS — I4891 Unspecified atrial fibrillation: Secondary | ICD-10-CM

## 2013-06-26 LAB — POCT INR: INR: 2.1

## 2013-07-24 ENCOUNTER — Ambulatory Visit: Payer: Medicare Other | Admitting: Gastroenterology

## 2013-07-30 ENCOUNTER — Telehealth: Payer: Self-pay

## 2013-07-30 ENCOUNTER — Encounter: Payer: Self-pay | Admitting: Nurse Practitioner

## 2013-07-30 ENCOUNTER — Ambulatory Visit (INDEPENDENT_AMBULATORY_CARE_PROVIDER_SITE_OTHER): Payer: Medicare Other | Admitting: Nurse Practitioner

## 2013-07-30 VITALS — BP 114/62 | HR 80 | Ht 70.0 in | Wt 205.1 lb

## 2013-07-30 DIAGNOSIS — I2589 Other forms of chronic ischemic heart disease: Secondary | ICD-10-CM | POA: Diagnosis not present

## 2013-07-30 DIAGNOSIS — Z79899 Other long term (current) drug therapy: Secondary | ICD-10-CM

## 2013-07-30 DIAGNOSIS — I255 Ischemic cardiomyopathy: Secondary | ICD-10-CM

## 2013-07-30 DIAGNOSIS — F329 Major depressive disorder, single episode, unspecified: Secondary | ICD-10-CM | POA: Diagnosis not present

## 2013-07-30 LAB — CBC WITH DIFFERENTIAL/PLATELET
Basophils Absolute: 0 10*3/uL (ref 0.0–0.1)
Basophils Relative: 0.4 % (ref 0.0–3.0)
Eosinophils Absolute: 0.1 10*3/uL (ref 0.0–0.7)
Eosinophils Relative: 1.1 % (ref 0.0–5.0)
HCT: 40.3 % (ref 39.0–52.0)
Hemoglobin: 13.9 g/dL (ref 13.0–17.0)
Lymphocytes Relative: 17 % (ref 12.0–46.0)
Lymphs Abs: 1 10*3/uL (ref 0.7–4.0)
MCHC: 34.5 g/dL (ref 30.0–36.0)
MCV: 90.3 fl (ref 78.0–100.0)
Monocytes Absolute: 0.4 10*3/uL (ref 0.1–1.0)
Monocytes Relative: 7.4 % (ref 3.0–12.0)
Neutro Abs: 4.2 10*3/uL (ref 1.4–7.7)
Neutrophils Relative %: 74.1 % (ref 43.0–77.0)
Platelets: 194 10*3/uL (ref 150.0–400.0)
RBC: 4.46 Mil/uL (ref 4.22–5.81)
RDW: 15.1 % — ABNORMAL HIGH (ref 11.5–14.6)
WBC: 5.7 10*3/uL (ref 4.5–10.5)

## 2013-07-30 LAB — BASIC METABOLIC PANEL
BUN: 13 mg/dL (ref 6–23)
CO2: 29 mEq/L (ref 19–32)
Calcium: 9.1 mg/dL (ref 8.4–10.5)
Chloride: 99 mEq/L (ref 96–112)
Creatinine, Ser: 1.6 mg/dL — ABNORMAL HIGH (ref 0.4–1.5)
GFR: 43.61 mL/min — ABNORMAL LOW (ref >60.00)
Glucose, Bld: 105 mg/dL — ABNORMAL HIGH (ref 70–99)
Potassium: 4.5 mEq/L (ref 3.5–5.1)
Sodium: 137 mEq/L (ref 135–145)

## 2013-07-30 LAB — TSH: TSH: 2.47 u[IU]/mL (ref 0.35–5.50)

## 2013-07-30 MED ORDER — WARFARIN SODIUM 5 MG PO TABS: ORAL_TABLET | ORAL | Status: DC
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
## 2013-07-30 NOTE — Patient Instructions (Addendum)
Ok to switch to generic coumadin which is Warfarin - I have sent this to the pharmacy  We need to check labs today - I will send to Dr. Waynard Edwards as well.  I am leaving you on your current medicines  I will send a note to Dr. Waynard Edwards. Please keep that appointment with him.   I will see you in 2 months(To be scheduled late morning)  Call the [facility] office at [phone] if you have any questions, problems or concerns.

## 2013-07-30 NOTE — Telephone Encounter (Signed)
lmom pt previosly used [facility] which is now closed. unable to escribe or call in rx for warfarin. called pt and left a message to inquire where he would like his rx sent

## 2013-07-31 ENCOUNTER — Other Ambulatory Visit: Payer: Self-pay | Admitting: *Deleted

## 2013-08-10 ENCOUNTER — Ambulatory Visit (INDEPENDENT_AMBULATORY_CARE_PROVIDER_SITE_OTHER): Payer: Medicare Other | Admitting: *Deleted

## 2013-08-10 DIAGNOSIS — I4891 Unspecified atrial fibrillation: Secondary | ICD-10-CM | POA: Diagnosis not present

## 2013-08-10 DIAGNOSIS — Z7901 Long term (current) use of anticoagulants: Secondary | ICD-10-CM

## 2013-08-10 LAB — POCT INR: INR: 3.5

## 2013-08-31 ENCOUNTER — Other Ambulatory Visit: Payer: Self-pay | Admitting: Pharmacist

## 2013-08-31 MED ORDER — AMIODARONE HCL 200 MG PO TABS: 200.0000 mg | ORAL_TABLET | Freq: Every day | ORAL | Status: DC

## 2013-09-07 ENCOUNTER — Ambulatory Visit (INDEPENDENT_AMBULATORY_CARE_PROVIDER_SITE_OTHER): Payer: Medicare Other | Admitting: *Deleted

## 2013-09-07 DIAGNOSIS — Z7901 Long term (current) use of anticoagulants: Secondary | ICD-10-CM | POA: Diagnosis not present

## 2013-09-07 DIAGNOSIS — I4891 Unspecified atrial fibrillation: Secondary | ICD-10-CM | POA: Diagnosis not present

## 2013-09-17 ENCOUNTER — Ambulatory Visit (INDEPENDENT_AMBULATORY_CARE_PROVIDER_SITE_OTHER): Payer: Medicare Other | Admitting: Nurse Practitioner

## 2013-09-17 ENCOUNTER — Encounter: Payer: Self-pay | Admitting: Nurse Practitioner

## 2013-09-17 VITALS — BP 126/64 | HR 55 | Ht 70.0 in | Wt 208.0 lb

## 2013-09-17 DIAGNOSIS — I2589 Other forms of chronic ischemic heart disease: Secondary | ICD-10-CM

## 2013-09-17 DIAGNOSIS — I255 Ischemic cardiomyopathy: Secondary | ICD-10-CM
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
## 2013-09-17 NOTE — Patient Instructions (Signed)
Stay on your current medicines  See Dr. Waynard Edwards next month as planned  See me in February  Call the [facility] office at [phone] if you have any questions, problems or concerns.

## 2013-09-25 ENCOUNTER — Telehealth: Payer: Self-pay | Admitting: *Deleted

## 2013-09-25 MED ORDER — WARFARIN SODIUM 5 MG PO TABS: 5.0000 mg | ORAL_TABLET | ORAL | Status: DC

## 2013-09-25 NOTE — Telephone Encounter (Signed)
Refill warfarin  

## 2013-10-09 ENCOUNTER — Ambulatory Visit (INDEPENDENT_AMBULATORY_CARE_PROVIDER_SITE_OTHER): Payer: Medicare Other | Admitting: *Deleted

## 2013-10-09 DIAGNOSIS — Z7901 Long term (current) use of anticoagulants: Secondary | ICD-10-CM

## 2013-10-09 DIAGNOSIS — I4891 Unspecified atrial fibrillation: Secondary | ICD-10-CM | POA: Diagnosis not present

## 2013-10-09 LAB — POCT INR: INR: 2.1

## 2013-10-15 DIAGNOSIS — K5900 Constipation, unspecified: Secondary | ICD-10-CM | POA: Diagnosis not present

## 2013-10-15 DIAGNOSIS — F411 Generalized anxiety disorder: Secondary | ICD-10-CM | POA: Diagnosis not present

## 2013-10-15 DIAGNOSIS — E039 Hypothyroidism, unspecified: Secondary | ICD-10-CM | POA: Diagnosis not present

## 2013-10-15 DIAGNOSIS — I10 Essential (primary) hypertension: Secondary | ICD-10-CM | POA: Diagnosis not present

## 2013-10-15 DIAGNOSIS — I2510 Atherosclerotic heart disease of native coronary artery without angina pectoris: Secondary | ICD-10-CM | POA: Diagnosis not present

## 2013-10-15 DIAGNOSIS — E119 Type 2 diabetes mellitus without complications: Secondary | ICD-10-CM | POA: Diagnosis not present

## 2013-10-15 DIAGNOSIS — I4891 Unspecified atrial fibrillation: Secondary | ICD-10-CM | POA: Diagnosis not present

## 2013-10-15 DIAGNOSIS — R259 Unspecified abnormal involuntary movements: Secondary | ICD-10-CM | POA: Diagnosis not present

## 2013-10-25 ENCOUNTER — Other Ambulatory Visit: Payer: Self-pay | Admitting: Cardiology

## 2013-11-06 ENCOUNTER — Ambulatory Visit (INDEPENDENT_AMBULATORY_CARE_PROVIDER_SITE_OTHER): Payer: Medicare Other | Admitting: *Deleted

## 2013-11-06 DIAGNOSIS — Z7901 Long term (current) use of anticoagulants: Secondary | ICD-10-CM | POA: Diagnosis not present

## 2013-11-06 DIAGNOSIS — I4891 Unspecified atrial fibrillation: Secondary | ICD-10-CM

## 2013-11-06 DIAGNOSIS — Z5181 Encounter for therapeutic drug level monitoring: Secondary | ICD-10-CM

## 2013-11-06 LAB — POCT INR: INR: 2.5

## 2013-11-27 ENCOUNTER — Ambulatory Visit: Payer: Medicare Other | Admitting: Nurse Practitioner

## 2013-12-18 ENCOUNTER — Ambulatory Visit (INDEPENDENT_AMBULATORY_CARE_PROVIDER_SITE_OTHER): Payer: Medicare Other | Admitting: *Deleted

## 2013-12-18 DIAGNOSIS — Z7901 Long term (current) use of anticoagulants: Secondary | ICD-10-CM

## 2013-12-18 DIAGNOSIS — Z5181 Encounter for therapeutic drug level monitoring: Secondary | ICD-10-CM | POA: Diagnosis not present

## 2013-12-18 DIAGNOSIS — I4891 Unspecified atrial fibrillation: Secondary | ICD-10-CM

## 2013-12-18 LAB — POCT INR: INR: 1.8

## 2013-12-19 ENCOUNTER — Ambulatory Visit
Admission: RE | Admit: 2013-12-19 | Discharge: 2013-12-19 | Disposition: A | Discharge status: Home / Self Care | Payer: Medicare Other | Source: Ambulatory Visit | Attending: Nurse Practitioner | Admitting: Nurse Practitioner

## 2013-12-19 ENCOUNTER — Ambulatory Visit (INDEPENDENT_AMBULATORY_CARE_PROVIDER_SITE_OTHER): Payer: Medicare Other | Admitting: Nurse Practitioner

## 2013-12-19 ENCOUNTER — Encounter: Payer: Self-pay | Admitting: Nurse Practitioner

## 2013-12-19 VITALS — BP 120/70 | HR 58 | Ht 70.0 in | Wt 213.0 lb

## 2013-12-19 DIAGNOSIS — I255 Ischemic cardiomyopathy: Secondary | ICD-10-CM

## 2013-12-19 DIAGNOSIS — Z7901 Long term (current) use of anticoagulants: Secondary | ICD-10-CM | POA: Diagnosis not present

## 2013-12-19 DIAGNOSIS — R9389 Abnormal findings on diagnostic imaging of other specified body structures: Secondary | ICD-10-CM | POA: Diagnosis not present

## 2013-12-19 DIAGNOSIS — I2589 Other forms of chronic ischemic heart disease: Secondary | ICD-10-CM

## 2013-12-19 DIAGNOSIS — I5022 Chronic systolic (congestive) heart failure: Secondary | ICD-10-CM

## 2013-12-19 DIAGNOSIS — Z79899 Other long term (current) drug therapy: Secondary | ICD-10-CM | POA: Diagnosis not present

## 2013-12-19 DIAGNOSIS — R251 Tremor, unspecified: Secondary | ICD-10-CM

## 2013-12-19 DIAGNOSIS — R259 Unspecified abnormal involuntary movements: Secondary | ICD-10-CM

## 2013-12-19 LAB — CBC
HCT: 39.5 % (ref 39.0–52.0)
Hemoglobin: 13.3 g/dL (ref 13.0–17.0)
MCHC: 33.7 g/dL (ref 30.0–36.0)
MCV: 92.6 fl (ref 78.0–100.0)
Platelets: 205 10*3/uL (ref 150.0–400.0)
RBC: 4.26 Mil/uL (ref 4.22–5.81)
RDW: 15.6 % — ABNORMAL HIGH (ref 11.5–14.6)
WBC: 6.1 10*3/uL (ref 4.5–10.5)

## 2013-12-19 LAB — HEPATIC FUNCTION PANEL
ALT: 25 U/L (ref 0–53)
AST: 22 U/L (ref 0–37)
Albumin: 4.4 g/dL (ref 3.5–5.2)
Alkaline Phosphatase: 40 U/L (ref 39–117)
Bilirubin, Direct: 0.1 mg/dL (ref 0.0–0.3)
Total Bilirubin: 1 mg/dL (ref 0.3–1.2)
Total Protein: 7.2 g/dL (ref 6.0–8.3)

## 2013-12-19 LAB — BASIC METABOLIC PANEL
BUN: 23 mg/dL (ref 6–23)
CO2: 29 mEq/L (ref 19–32)
Calcium: 9.2 mg/dL (ref 8.4–10.5)
Chloride: 104 mEq/L (ref 96–112)
Creatinine, Ser: 1.4 mg/dL (ref 0.4–1.5)
GFR: 51.03 mL/min — ABNORMAL LOW (ref >60.00)
Glucose, Bld: 113 mg/dL — ABNORMAL HIGH (ref 70–99)
Potassium: 4 mEq/L (ref 3.5–5.1)
Sodium: 139 mEq/L (ref 135–145)

## 2013-12-19 LAB — TSH: TSH: 3.42 u[IU]/mL (ref 0.35–5.50)
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
## 2013-12-19 NOTE — Patient Instructions (Addendum)
We will try to get you to neurology - evaluation for possible Parkinson's  Stay on your medicines  We need to check labs today  We need to get a CXR today - Please go to [facility] on the first floor for a chest Xray - you may walk in.   See me in 4 months  Call the [facility] office at [phone] if you have any questions, problems or concerns.

## 2013-12-26 ENCOUNTER — Other Ambulatory Visit: Payer: Self-pay

## 2013-12-26 MED ORDER — LOSARTAN POTASSIUM 50 MG PO TABS: ORAL_TABLET | ORAL | Status: DC

## 2014-01-07 ENCOUNTER — Ambulatory Visit (INDEPENDENT_AMBULATORY_CARE_PROVIDER_SITE_OTHER): Payer: Medicare Other | Admitting: Neurology

## 2014-01-07 ENCOUNTER — Encounter: Payer: Self-pay | Admitting: Neurology

## 2014-01-07 VITALS — BP 126/74 | HR 60 | Temp 98.1°F | Ht 70.0 in | Wt 207.4 lb

## 2014-01-07 DIAGNOSIS — G252 Other specified forms of tremor: Secondary | ICD-10-CM

## 2014-01-07 DIAGNOSIS — G250 Essential tremor: Secondary | ICD-10-CM | POA: Diagnosis not present

## 2014-01-07 DIAGNOSIS — T462X5A Adverse effect of other antidysrhythmic drugs, initial encounter: Secondary | ICD-10-CM | POA: Diagnosis not present

## 2014-01-07 DIAGNOSIS — G620 Drug-induced polyneuropathy: Secondary | ICD-10-CM | POA: Diagnosis not present

## 2014-01-07 DIAGNOSIS — I2589 Other forms of chronic ischemic heart disease: Secondary | ICD-10-CM

## 2014-01-07 DIAGNOSIS — G251 Drug-induced tremor: Secondary | ICD-10-CM

## 2014-01-07 NOTE — Progress Notes (Signed)
Subjective:    [name] was seen in consultation in the movement disorder clinic at the request of Norma Fredrickson, the patients cardiology NP.  His PCP is Ezequiel Kayser, MD.  The evaluation is for tremor.      The patient is a 79 y.o. right handed male with a history of tremor. Pt reports that he has had tremor ever since he had a CABG and then had a subsequent infection and had an abnormal heart rhythm and had to be put on amiodarone.  This was about 5 years ago per pt.  Tremor started in the R hand but is now in the L hand.  It bothers him most when using the hand.  It got worse when amiodarone was increased to 5 days per week.   Per reviewed notes, Ms. Tyrone Sage has told the pt that tremor could be from the amiodarone but the patient has refused to allow a decrease in the medication. Pt denies this today, however.   Affected by caffeine:  Doesn't drink any Affected by alcohol:  Doesn't drink any now Affected by stress:  yes Affected by fatigue:  no Spills soup if on spoon:  yes Spills glass of liquid if full:  yes Affects ADL's (tying shoes, brushing teeth, etc):  yes (uses electric razor and then goes back with straight blade and then has to use 2 hands)  Current/Previously tried tremor medications: n/a  Current medications that may exacerbate tremor:  amiodarone  Outside reports reviewed: historical medical records and referral letter/letters.  No Known Allergies  Current Outpatient Prescriptions on File Prior to Visit  Medication Sig Dispense Refill  . ALPRAZolam (XANAX) 0.5 MG tablet Take 0.5 mg by mouth 3 (three) times daily as needed. For anxiety.      Marland Kitchen amiodarone (PACERONE) 200 MG tablet Take 1 tablet (200 mg total) by mouth daily.  30 tablet  6  . aspirin 81 MG tablet Take 81 mg by mouth daily.        Marland Kitchen escitalopram (LEXAPRO) 10 MG tablet Take 10 mg by mouth daily.        . finasteride (PROSCAR) 5 MG tablet Take 5 mg by mouth daily.        Marland Kitchen levothyroxine (SYNTHROID,  LEVOTHROID) 50 MCG tablet TAKE ONE TABLET BY MOUTH EVERY DAY  30 tablet  3  . losartan (COZAAR) 50 MG tablet TAKE 1 TABLET BY MOUTH EVERY DAY  30 tablet  6  . pravastatin (PRAVACHOL) 20 MG tablet       . sitaGLIPtin (JANUVIA) 50 MG tablet Take 25 mg by mouth as directed. 25 mg twice a week      . warfarin (COUMADIN) 5 MG tablet Take 1 tablet (5 mg total) by mouth as directed.  30 tablet  3   No current facility-administered medications on file prior to visit.    Past Medical History  Diagnosis Date  . Chest discomfort   . Low back pain   . Ischemic cardiomyopathy     EF 23% => improved to 40-45%;  Echo 5/14:  Mild LVH, EF 20-25%, mid to distal anteroseptal, anterior, inferior and apical AK, mild AI, mean AV gradient 4, moderate MR(Limited interrogation), moderate LAE, mild reduced RVSF, mild to moderate TR, peak RV-RA gradient 40  . Hearing loss   . SOB (shortness of breath)   . Joint pain   . VT (ventricular tachycardia)     s/p ICD explanted for infection.  The decision has been not  to reimplant this given these difficulties.  . Anxiety   . Depression   . PAF (paroxysmal atrial fibrillation)   . Hyperlipidemia   . Gynecomastia   . BPH (benign prostatic hypertrophy)   . Diabetes mellitus   . Hypertension   . Degenerative joint disease   . GERD (gastroesophageal reflux disease)   . CAD (coronary artery disease)   . Pneumonia     Past Surgical History  Procedure Laterality Date  . Cardiac defibrillator removal    . Coronary artery bypass graft  2000    X5  . Cardiac catheterization  12/25/2008    THE LEFT VENTRICLE WAS ENLARGED. THERE IS ANTERIOR APICAL AND INFERIOR APICAL AKINESIA. EF ESTIMATED 20%. NO MITRAL REGURGITATION.  . Cardiac catheterization  12/2008    REPEAT CATH, SHOWED BYPASS GRAFTS WERE PATENT  . US echocardiography  2007    EF 20 to 25%  . Ablation of dysrhythmic focus    . Pars plana vitrectomy  09/24/2011    Procedure: PARS PLANA VITRECTOMY WITH 25 GAUGE;   Surgeon: Alford Highland Rankin;  Location: [facility];  Service: Ophthalmology;  Laterality: Left;  MEMBRANE PEEL, SILICONE OIL  . Cardioversion N/A 11/20/2012    Procedure: CARDIOVERSION;  Surgeon: Pricilla Riffle, MD;  Location: [facility] ENDOSCOPY;  Service: Cardiovascular;  Laterality: N/A;  . Cardioversion N/A 12/28/2012    Procedure: CARDIOVERSION;  Surgeon: Cassell Clement, MD;  [facility] END[facility];  Service: Cardiovascular;  Laterality: N/A;    History   Social History  . Marital Status: Married    Spouse Name: N/A    Number of Children: N/A  . Years of Education: N/A   Occupational History  . Not on file.   Social History Main Topics  . Smoking status: Never Smoker   . Smokeless tobacco: Never Used  . Alcohol Use: No     Comment: hx of in younger years, not now  . Drug Use: No  . Sexual Activity: Not Currently   Other Topics Concern  . Not on file   Social History Narrative  . No narrative on file    Family Status  Relation Status Death Age  . Mother Deceased   . Father Deceased     Review of Systems No CP/SOB.  Knee pain bilateral.  Paresthesias in the feet bilateral.  Legs feel weak above the knees. 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  Some problem swallowing.  A complete 10 system ROS was obtained and was negative apart from what is mentioned.   Objective:   VITALS:   Filed Vitals:   01/07/14 1146  BP: 126/74  Pulse: 60  Temp: 98.1 F (36.7 C)  TempSrc: Oral  Height: 5\' 10"  (1.778 m)  Weight: 207 lb 6.4 oz (94.076 kg)   Gen:  Appears stated age and in NAD. HEENT:  Normocephalic, atraumatic. The mucous membranes are moist. The superficial temporal arteries are without ropiness or tenderness. Cardiovascular: Regular rate and rhythm. Lungs: Clear to auscultation bilaterally. Neck: There are no carotid bruits noted bilaterally.  NEUROLOGICAL:  Orientation:  The patient is alert and oriented x 3.  Recent and remote memory are intact.  Attention span and concentration  are normal.  Able to name objects and repeat without trouble.  Fund of knowledge is appropriate Cranial nerves: There is good facial symmetry. The pupils are equal round and reactive to light bilaterally. Fundoscopic exam is attempted but the disc margins are not well visualized bilaterally. Extraocular muscles are intact and visual  fields are full to confrontational testing. Speech is fluent and clear. Soft palate rises symmetrically and there is no tongue deviation. Hearing is intact to conversational tone. Tone: Tone is good throughout.  No cogwheeling Sensation: Sensation is intact to light touch and pinprick throughout (facial, trunk, extremities). Pinprick is decreased in a stocking distribution.  Vibration is decreased distally.  There is no extinction with double simultaneous stimulation. There is no sensory dermatomal level identified. Coordination:  The patient has no dysdiadichokinesia or dysmetria. Motor: Strength is 5/5 in the bilateral upper and lower extremities.  Shoulder shrug is equal bilaterally.  There is no pronator drift.  There are no fasciculations noted. DTR's: Deep tendon reflexes are 1/4 at the bilateral biceps, triceps, brachioradialis, patella and absent at the bilateral achilles.  Plantar responses are downgoing bilaterally. Gait and Station: The patient is has a short stepped gait and drags the r leg just in the doorway.  Able to get up without hands but does wobble a little after doing so.    MOVEMENT EXAM: Tremor:  There is a RUE resting tremor.  It is better with action but he does have trouble with archimedes spirals on the R.         Assessment/Plan:   1.  Tremor.  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  It started with the initiation of amiodarone, and got worse with the increase of the medication.  About one third of patients exposed to the medication will have tremor.  -I am more concerned about the fact that the patient may have some degree of neuropathy,  which is likely diabetic peripheral neuropathy, but based on his history, he may have some amiodarone-induced peripheral neuropathy as well as a possible myopathy as well.  He and I talked about this possibility today.  He wanted to hold on the EMG test.  He is going to talk to his cardiologist about the amiodarone, which I really think is very reasonable.  He certainly may need the medication.  He does understand the risks of it.  I also sent a message to his cardiologist and will send a copy of my notes as well.  We would be happy to see him back for EMG, if needed.  I really do not want to add further medication for the tremor if I do not have to.  The patient agreed.  He will let us know how he would like to proceed in the future if he does need us.

## 2014-01-08 ENCOUNTER — Telehealth: Payer: Self-pay | Admitting: *Deleted

## 2014-01-08 ENCOUNTER — Telehealth: Payer: Self-pay | Admitting: Nurse Practitioner

## 2014-01-08 NOTE — Telephone Encounter (Signed)
New Message:  Pt's wife states her husband went to [facility] yesterday and that she was sending information to [facility] about ordering a new Rx for him.

## 2014-01-08 NOTE — Telephone Encounter (Signed)
Message copied by Debbe Bales on Tue Jan 08, 2014  1:28 PM ------      Message from: Rosalio Macadamia      Created: Mon Jan 07, 2014  2:30 PM       Can you call and see if he would like a visit with me or with Dr. Graciela Husbands in the next couple of weeks?            If it is with me - I need it to be on a day that Dr. Graciela Husbands is here.            lori ------

## 2014-01-08 NOTE — Telephone Encounter (Signed)
S/w pt's wife is agreeable to come in and see Lawson Fiscal when Dr. Graciela Husbands is in the office appt made

## 2014-01-08 NOTE — Telephone Encounter (Signed)
Message copied by Debbe Bales on Tue Jan 08, 2014 11:30 AM ------      Message from: Rosalio Macadamia      Created: Mon Jan 07, 2014  2:30 PM       Can you call and see if he would like a visit with me or with Dr. Graciela Husbands in the next couple of weeks?            If it is with me - I need it to be on a day that Dr. Graciela Husbands is here.            lori ------

## 2014-01-11 ENCOUNTER — Ambulatory Visit (INDEPENDENT_AMBULATORY_CARE_PROVIDER_SITE_OTHER): Payer: Medicare Other | Admitting: *Deleted

## 2014-01-11 DIAGNOSIS — I4891 Unspecified atrial fibrillation: Secondary | ICD-10-CM

## 2014-01-11 DIAGNOSIS — Z5181 Encounter for therapeutic drug level monitoring: Secondary | ICD-10-CM

## 2014-01-11 DIAGNOSIS — Z7901 Long term (current) use of anticoagulants: Secondary | ICD-10-CM

## 2014-01-11 LAB — POCT INR: INR: 2.6

## 2014-01-29 ENCOUNTER — Encounter: Payer: Self-pay | Admitting: Internal Medicine

## 2014-01-29 ENCOUNTER — Ambulatory Visit (INDEPENDENT_AMBULATORY_CARE_PROVIDER_SITE_OTHER): Payer: Medicare Other | Admitting: Internal Medicine

## 2014-01-29 VITALS — BP 151/79 | HR 70 | Ht 70.0 in | Wt 208.0 lb

## 2014-01-29 DIAGNOSIS — I2589 Other forms of chronic ischemic heart disease: Secondary | ICD-10-CM

## 2014-01-29 DIAGNOSIS — I4729 Other ventricular tachycardia: Secondary | ICD-10-CM | POA: Diagnosis not present

## 2014-01-29 DIAGNOSIS — I4891 Unspecified atrial fibrillation: Secondary | ICD-10-CM | POA: Diagnosis not present

## 2014-01-29 DIAGNOSIS — I472 Ventricular tachycardia: Secondary | ICD-10-CM | POA: Diagnosis not present

## 2014-01-29 MED ORDER — AMIODARONE HCL 200 MG PO TABS: ORAL_TABLET | ORAL | Status: DC

## 2014-01-29 MED ORDER — LOSARTAN POTASSIUM 50 MG PO TABS: 25.0000 mg | ORAL_TABLET | Freq: Every day | ORAL | Status: DC

## 2014-01-29 NOTE — Progress Notes (Signed)
Patient Care Team: Ezequiel KayserMark A Perini, MD as PCP - General (Internal Medicine)   HPI  [name] DadBen F Cook is a 79 y.o. male Seen in followup for an ICD implanted years ago subsequently explanted because of infection.   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 He ended up being treated with a [facility]  . Remote CABG in 2000. He had his last cath in March of 2010 which showed his grafts to be patent with proximal LAD occluded, proximal RCA occluded, intermediate with irregularities, SVG-LAD patent with 30% proximal, SVG-RCA patent, SVG-OM patent with 30% proximal, LIMA-LAD patent, EF 20%.    He also has a history of atrial arrhythmias for which he been treated with Coumadin and amiodarone . 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   He has complaints of constipation, difficulty with urination and is unable to stand for prolonged periods of time, this occurred while showering as well as walking. It is relieved by sitting.  Past Medical History  Diagnosis Date  . Chest discomfort   . Low back pain   . Ischemic cardiomyopathy     EF 23% => improved to 40-45%;  Echo 5/14:  Mild LVH, EF 20-25%, mid to distal anteroseptal, anterior, inferior and apical AK, mild AI, mean AV gradient 4, moderate MR(Limited interrogation), moderate LAE, mild reduced RVSF, mild to moderate TR, peak RV-RA gradient 40  . Hearing loss   . SOB (shortness of breath)   . Joint pain   . VT (ventricular tachycardia)     s/p ICD explanted for infection.  The decision has been not to reimplant this given these difficulties.  . Anxiety   . Depression   . PAF (paroxysmal atrial fibrillation)   . Hyperlipidemia   . Gynecomastia   . BPH (benign prostatic hypertrophy)   . Diabetes mellitus   . Hypertension   . Degenerative joint disease   . GERD (gastroesophageal reflux disease)   . CAD (coronary artery disease)   . Pneumonia     Past Surgical History    Procedure Laterality Date  . Cardiac defibrillator removal    . Coronary artery bypass graft  2000    X5  . Cardiac catheterization  12/25/2008    THE LEFT VENTRICLE WAS ENLARGED. THERE IS ANTERIOR APICAL AND INFERIOR APICAL AKINESIA. EF ESTIMATED 20%. NO MITRAL REGURGITATION.  . Cardiac catheterization  12/2008    REPEAT CATH, SHOWED BYPASS GRAFTS WERE PATENT  . [place] echocardiography  2007    EF 20 to 25%  . Ablation of dysrhythmic focus    . Pars plana vitrectomy  09/24/2011    Procedure: PARS PLANA VITRECTOMY WITH 25 GAUGE;  Surgeon: Alford HighlandGary A Rankin;  Location: [facility] OR;  Service: Ophthalmology;  Laterality: Left;  MEMBRANE PEEL, SILICONE OIL  . Cardioversion N/A 11/20/2012    Procedure: CARDIOVERSION;  Surgeon: Pricilla RifflePaula V Ross, MD;  [facility];  Service: Cardiovascular;  Laterality: N/A;  . Cardioversion N/A 12/28/2012    Procedure: CARDIOVERSION;  Surgeon: Cassell Clementhomas Brackbill, MD;  [facility];  Service: Cardiovascular;  Laterality: N/A;  . Cataract extraction, bilateral      Current Outpatient Prescriptions  Medication Sig Dispense Refill  . ALPRAZolam (XANAX) 0.5 MG tablet Take 0.5 mg by mouth 3 (three) times daily as needed. For anxiety.      Marland Kitchen. amiodarone (PACERONE) 200 MG tablet Take 1 tablet (200 mg total) by mouth daily.  30 tablet  6  . aspirin 81 MG tablet Take 81 mg by mouth daily.        Marland Kitchen escitalopram (LEXAPRO) 10 MG tablet Take 10 mg by mouth daily.        . finasteride (PROSCAR) 5 MG tablet Take 5 mg by mouth daily.        Marland Kitchen levothyroxine (SYNTHROID, LEVOTHROID) 50 MCG tablet TAKE 1/2 TABLET BY MOUTH EVERY DAY      . losartan (COZAAR) 50 MG tablet TAKE 1 TABLET BY MOUTH EVERY DAY  30 tablet  6  . pravastatin (PRAVACHOL) 20 MG tablet Take 20 mg by mouth daily.       . sitaGLIPtin (JANUVIA) 50 MG tablet Take 25 mg by mouth as directed. 25 mg twice a week      . warfarin (COUMADIN) 5 MG tablet Take 1 tablet (5 mg total) by mouth as directed.  30 tablet  3   No  current facility-administered medications for this visit.    No Known Allergies  Review of Systems negative except from HPI and PMH  Physical Exam BP 151/79  Pulse 70  Ht 5\' 10"  (1.778 m)  Wt 208 lb (94.348 kg)  BMI 29.84 kg/m2 Well developed and well nourished in no acute distress HENT normal E scleral and icterus clear Neck Supple JVP flat; carotids brisk and full Clear to ausculation  Regular rate and rhythm, no murmurs gallops or rub Soft with active bowel sounds No clubbing cyanosis  Edema Alert and oriented, grossly normal motor and sensory function Skin Warm and Dry    Assessment and  Plan  Ventricular tachycardia  Atrial fibrillation/flutter  Ischemic cardiomyopathy with prior bypass ejection fraction 20-25% 5/14  ICD-explanted  Tremor/neuropathy ? related amiodarone  Orthostatic hypotension     [name] has multiple symptoms some of which may be attributed to amiodarone. We will take the liberty of decreasing his amiodarone from 200 7--5 days per week  We will also decrease his Cozaar from 50--25 mg.  There are many potential contributors to his orthostatic intolerance including diabetic neuropathy and amiodarone autonomic neuropathy  we'll also give him an abdominal binder. He can get this at [facility].  . In the event that the aforementioned adjustments are insufficient, I would at [employer].  Will anticipate followup with neurology.  He will followup with his PCP.  We'll see him in 3 months.

## 2014-01-29 NOTE — Patient Instructions (Addendum)
Your physician has recommended you make the following change in your medication:  1) STOP Aspirin 2) DECREASE Amiodarone to 200 mg 5 days a week 3) DECREASE Losartan to 25 mg daily  (take 1/2 tablet)  Your physician recommends that you wear an abdominal binder.  Your physician wants you to follow-up in: 3 months with Dr. Graciela Husbands.  You will receive a reminder letter in the mail two months in advance. If you don't receive a letter, please call our office to schedule the follow-up appointment.

## 2014-02-02 ENCOUNTER — Other Ambulatory Visit: Payer: Self-pay | Admitting: Internal Medicine

## 2014-02-08 ENCOUNTER — Ambulatory Visit (INDEPENDENT_AMBULATORY_CARE_PROVIDER_SITE_OTHER): Payer: Medicare Other | Admitting: *Deleted

## 2014-02-08 DIAGNOSIS — Z7901 Long term (current) use of anticoagulants: Secondary | ICD-10-CM | POA: Diagnosis not present

## 2014-02-08 DIAGNOSIS — I4891 Unspecified atrial fibrillation: Secondary | ICD-10-CM | POA: Diagnosis not present

## 2014-02-08 DIAGNOSIS — Z5181 Encounter for therapeutic drug level monitoring: Secondary | ICD-10-CM

## 2014-02-08 LAB — POCT INR: INR: 2.1

## 2014-02-13 DIAGNOSIS — E039 Hypothyroidism, unspecified: Secondary | ICD-10-CM | POA: Diagnosis not present

## 2014-02-13 DIAGNOSIS — M549 Dorsalgia, unspecified: Secondary | ICD-10-CM | POA: Diagnosis not present

## 2014-02-13 DIAGNOSIS — I509 Heart failure, unspecified: Secondary | ICD-10-CM | POA: Diagnosis not present

## 2014-02-13 DIAGNOSIS — E1149 Type 2 diabetes mellitus with other diabetic neurological complication: Secondary | ICD-10-CM | POA: Diagnosis not present

## 2014-02-13 DIAGNOSIS — Z6830 Body mass index (BMI) 30.0-30.9, adult: Secondary | ICD-10-CM | POA: Diagnosis not present

## 2014-02-13 DIAGNOSIS — I2510 Atherosclerotic heart disease of native coronary artery without angina pectoris: Secondary | ICD-10-CM | POA: Diagnosis not present

## 2014-02-13 DIAGNOSIS — I4891 Unspecified atrial fibrillation: Secondary | ICD-10-CM | POA: Diagnosis not present

## 2014-03-08 ENCOUNTER — Ambulatory Visit (INDEPENDENT_AMBULATORY_CARE_PROVIDER_SITE_OTHER): Payer: Medicare Other | Admitting: *Deleted

## 2014-03-08 DIAGNOSIS — Z5181 Encounter for therapeutic drug level monitoring: Secondary | ICD-10-CM

## 2014-03-08 DIAGNOSIS — Z7901 Long term (current) use of anticoagulants: Secondary | ICD-10-CM | POA: Diagnosis not present

## 2014-03-08 DIAGNOSIS — I4891 Unspecified atrial fibrillation: Secondary | ICD-10-CM | POA: Diagnosis not present

## 2014-03-08 LAB — POCT INR: INR: 2.4

## 2014-04-12 ENCOUNTER — Ambulatory Visit (INDEPENDENT_AMBULATORY_CARE_PROVIDER_SITE_OTHER): Payer: Medicare Other | Admitting: *Deleted

## 2014-04-12 DIAGNOSIS — Z5181 Encounter for therapeutic drug level monitoring: Secondary | ICD-10-CM

## 2014-04-12 DIAGNOSIS — I4891 Unspecified atrial fibrillation: Secondary | ICD-10-CM | POA: Diagnosis not present

## 2014-04-12 DIAGNOSIS — Z7901 Long term (current) use of anticoagulants: Secondary | ICD-10-CM

## 2014-04-12 LAB — POCT INR: INR: 3.4

## 2014-04-16 ENCOUNTER — Ambulatory Visit: Payer: Medicare Other | Admitting: Nurse Practitioner

## 2014-05-03 ENCOUNTER — Encounter: Payer: Self-pay | Admitting: Internal Medicine

## 2014-05-03 ENCOUNTER — Ambulatory Visit (INDEPENDENT_AMBULATORY_CARE_PROVIDER_SITE_OTHER): Payer: Medicare Other | Admitting: Internal Medicine

## 2014-05-03 VITALS — BP 143/68 | HR 53 | Ht 70.5 in | Wt 209.0 lb

## 2014-05-03 DIAGNOSIS — I2589 Other forms of chronic ischemic heart disease: Secondary | ICD-10-CM | POA: Diagnosis not present

## 2014-05-03 DIAGNOSIS — I4729 Other ventricular tachycardia: Secondary | ICD-10-CM | POA: Diagnosis not present

## 2014-05-03 DIAGNOSIS — I472 Ventricular tachycardia, unspecified: Secondary | ICD-10-CM

## 2014-05-03 DIAGNOSIS — I4891 Unspecified atrial fibrillation: Secondary | ICD-10-CM | POA: Diagnosis not present

## 2014-05-03 DIAGNOSIS — I255 Ischemic cardiomyopathy: Secondary | ICD-10-CM

## 2014-05-03 DIAGNOSIS — I480 Paroxysmal atrial fibrillation: Secondary | ICD-10-CM

## 2014-05-03 MED ORDER — AMIODARONE HCL 200 MG PO TABS: ORAL_TABLET | ORAL | Status: DC

## 2014-05-03 NOTE — Patient Instructions (Signed)
Your physician has recommended you make the following change in your medication:  1) DECREASE Amiodarone to 100 mg daily  Use an abdominal binder.  Your physician wants you to follow-up in: 6 months with Dr. Graciela Husbands. You will receive a reminder letter in the mail two months in advance. If you don't receive a letter, please call our office to schedule the follow-up appointment.

## 2014-05-03 NOTE — Progress Notes (Signed)
Patient Care Team: Ezequiel Kayser, MD as PCP - General (Internal Medicine)   HPI  [name] is a 79 y.o. male Seen in followup for an ICD implanted years ago subsequently explanted because of infection.   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 He ended up being treated with a catheter ablation at [facility]  . Remote CABG in 2000. He had his last cath in March of 2010 which showed his grafts to be patent with proximal LAD occluded, proximal RCA occluded, intermediate with irregularities, SVG-LAD patent with 30% proximal, SVG-RCA patent, SVG-OM patent with 30% proximal, LIMA-LAD patent, EF 20%.    He also has a history of atrial arrhythmias for which he been treated with Coumadin and amiodarone . 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  His tremor somewhat improved with the decrease dose from 1400--1000 mg a week.  His major issue seems to be lightheadedness and exercise intolerance. This did not in all improve with a decrease of his amiodarone.   He denies chest pain. Exercise intolerance however the major issue.  Past Medical History  Diagnosis Date  . Chest discomfort   . Low back pain   . Ischemic cardiomyopathy     EF 23% => improved to 40-45%;  Echo 5/14:  Mild LVH, EF 20-25%, mid to distal anteroseptal, anterior, inferior and apical AK, mild AI, mean AV gradient 4, moderate MR(Limited interrogation), moderate LAE, mild reduced RVSF, mild to moderate TR, peak RV-RA gradient 40  . Hearing loss   . SOB (shortness of breath)   . Joint pain   . VT (ventricular tachycardia)     s/p ICD explanted for infection.  The decision has been not to reimplant this given these difficulties.  . Anxiety   . Depression   . PAF (paroxysmal atrial fibrillation)   . Hyperlipidemia   . Gynecomastia   . BPH (benign prostatic hypertrophy)   . Diabetes mellitus   . Hypertension   . Degenerative joint disease   . GERD (gastroesophageal reflux  disease)   . CAD (coronary artery disease)   . Pneumonia     Past Surgical History  Procedure Laterality Date  . Cardiac defibrillator removal    . Coronary artery bypass graft  2000    X5  . Cardiac catheterization  12/25/2008    THE LEFT VENTRICLE WAS ENLARGED. THERE IS ANTERIOR APICAL AND INFERIOR APICAL AKINESIA. EF ESTIMATED 20%. NO MITRAL REGURGITATION.  . Cardiac catheterization  12/2008    REPEAT CATH, SHOWED BYPASS GRAFTS WERE PATENT  . US echocardiography  2007    EF 20 to 25%  . Ablation of dysrhythmic focus    . Pars plana vitrectomy  09/24/2011    Procedure: PARS PLANA VITRECTOMY WITH 25 GAUGE;  Surgeon: Alford Highland Rankin;  Location: [facility] OR;  Service: Ophthalmology;  Laterality: Left;  MEMBRANE PEEL, SILICONE OIL  . Cardioversion N/A 11/20/2012    Procedure: CARDIOVERSION;  Surgeon: Pricilla Riffle, MD;  Location: [facility] ENDOSCOPY;  Service: Cardiovascular;  Laterality: N/A;  . Cardioversion N/A 12/28/2012    Procedure: CARDIOVERSION;  Surgeon: Cassell Clement, MD;  Location: [facility] ENDOSCOPY;  Service: Cardiovascular;  Laterality: N/A;  . Cataract extraction, bilateral      Current Outpatient Prescriptions  Medication Sig Dispense Refill  . ALPRAZolam (XANAX) 0.5 MG tablet Take 0.5 mg by mouth 3 (three) times daily as needed. For anxiety.      Marland Kitchen  amiodarone (PACERONE) 200 MG tablet Take 1 tablet (200 mg total) by mouth 5 days a week.  25 tablet  6  . escitalopram (LEXAPRO) 10 MG tablet Take 10 mg by mouth daily.        . finasteride (PROSCAR) 5 MG tablet Take 5 mg by mouth daily.        Marland Kitchen. levothyroxine (SYNTHROID, LEVOTHROID) 50 MCG tablet TAKE 1/2 TABLET BY MOUTH EVERY DAY      . losartan (COZAAR) 50 MG tablet Take 0.5 tablets (25 mg total) by mouth daily.  30 tablet  6  . pravastatin (PRAVACHOL) 20 MG tablet Take 20 mg by mouth daily.       . sitaGLIPtin (JANUVIA) 50 MG tablet Take 25 mg by mouth as directed. 25 mg twice a week      . warfarin (COUMADIN) 5 MG tablet TAKE 1 TABLET (5  MG TOTAL) BY MOUTH AS DIRECTED.  30 tablet  3   No current facility-administered medications for this visit.    No Known Allergies  Review of Systems negative except from HPI and PMH  Physical Exam BP 143/68  Pulse 53  Ht 5' 10.5" (1.791 m)  Wt 209 lb (94.802 kg)  BMI 29.55 kg/m2 Well developed and well nourished in no acute distress HENT normal E scleral and icterus clear Neck Supple JVP flat; carotids brisk and full Clear to ausculation  Well-healed but  tethered scars  Regular rate and rhythm, no murmurs gallops or rub Soft with active bowel sounds No clubbing cyanosis  Edema Alert and oriented, grossly normal motor and sensory function Skin Warm and Dry    Assessment and  Plan  Ventricular tachycardia  Paroxysmal Atrial fibrillation/flutter  Sinus bradycardia and chronotropic incompetence  Ischemic cardiomyopathy with prior bypass ejection fraction 20-25% 5/14  ICD-explanted  Tremor/neuropathy ? related amiodarone  Orthostatic hypotension  While his tremor and some better his dizziness remains a major problem. We will further decrease his amiodarone from 1000-700 mg a week.  We will also give him an abdominal binder    He is also chronotropic incompetent. We discussed the possibility of device implantation. He is not enthused although he is open to the possibility.

## 2014-05-10 ENCOUNTER — Ambulatory Visit (INDEPENDENT_AMBULATORY_CARE_PROVIDER_SITE_OTHER): Payer: Medicare Other | Admitting: *Deleted

## 2014-05-10 DIAGNOSIS — I4891 Unspecified atrial fibrillation: Secondary | ICD-10-CM | POA: Diagnosis not present

## 2014-05-10 DIAGNOSIS — Z7901 Long term (current) use of anticoagulants: Secondary | ICD-10-CM

## 2014-05-10 DIAGNOSIS — Z5181 Encounter for therapeutic drug level monitoring: Secondary | ICD-10-CM | POA: Diagnosis not present

## 2014-05-10 LAB — POCT INR: INR: 4.2

## 2014-05-24 ENCOUNTER — Ambulatory Visit (INDEPENDENT_AMBULATORY_CARE_PROVIDER_SITE_OTHER): Payer: Medicare Other | Admitting: *Deleted

## 2014-05-24 DIAGNOSIS — Z5181 Encounter for therapeutic drug level monitoring: Secondary | ICD-10-CM

## 2014-05-24 DIAGNOSIS — I4891 Unspecified atrial fibrillation: Secondary | ICD-10-CM | POA: Diagnosis not present

## 2014-05-24 DIAGNOSIS — Z7901 Long term (current) use of anticoagulants: Secondary | ICD-10-CM | POA: Diagnosis not present

## 2014-05-24 LAB — POCT INR: INR: 2.8

## 2014-06-25 ENCOUNTER — Ambulatory Visit (INDEPENDENT_AMBULATORY_CARE_PROVIDER_SITE_OTHER): Payer: Medicare Other | Admitting: *Deleted

## 2014-06-25 DIAGNOSIS — Z5181 Encounter for therapeutic drug level monitoring: Secondary | ICD-10-CM | POA: Diagnosis not present

## 2014-06-25 DIAGNOSIS — I4891 Unspecified atrial fibrillation: Secondary | ICD-10-CM

## 2014-06-25 DIAGNOSIS — Z7901 Long term (current) use of anticoagulants: Secondary | ICD-10-CM | POA: Diagnosis not present

## 2014-06-25 LAB — POCT INR: INR: 3.8

## 2014-06-30 ENCOUNTER — Other Ambulatory Visit: Payer: Self-pay | Admitting: Internal Medicine

## 2014-07-26 DIAGNOSIS — Z6829 Body mass index (BMI) 29.0-29.9, adult: Secondary | ICD-10-CM | POA: Diagnosis not present

## 2014-07-26 DIAGNOSIS — E119 Type 2 diabetes mellitus without complications: Secondary | ICD-10-CM | POA: Diagnosis not present

## 2014-07-26 DIAGNOSIS — N401 Enlarged prostate with lower urinary tract symptoms: Secondary | ICD-10-CM | POA: Diagnosis not present

## 2014-07-26 DIAGNOSIS — Z23 Encounter for immunization: Secondary | ICD-10-CM | POA: Diagnosis not present

## 2014-07-26 DIAGNOSIS — E039 Hypothyroidism, unspecified: Secondary | ICD-10-CM | POA: Diagnosis not present

## 2014-07-26 DIAGNOSIS — D539 Nutritional anemia, unspecified: Secondary | ICD-10-CM | POA: Diagnosis not present

## 2014-07-26 DIAGNOSIS — I480 Paroxysmal atrial fibrillation: Secondary | ICD-10-CM | POA: Diagnosis not present

## 2014-07-26 DIAGNOSIS — I10 Essential (primary) hypertension: Secondary | ICD-10-CM | POA: Diagnosis not present

## 2014-07-30 ENCOUNTER — Telehealth: Payer: Self-pay | Admitting: Internal Medicine

## 2014-07-30 ENCOUNTER — Other Ambulatory Visit: Payer: Self-pay | Admitting: *Deleted

## 2014-07-30 NOTE — Telephone Encounter (Signed)
New problem   Pt is having a dental procedure and need to come off of blood thinner 08/01/14 and appt is 08/05/14 and pt will start blood thinner back on 08/06/14. Please advise if this is all right.

## 2014-07-30 NOTE — Telephone Encounter (Signed)
Follow up   ° ° °Office calling back to speak with nurse.   °

## 2014-07-31 NOTE — Telephone Encounter (Signed)
Patient has abscessed tooth. Scheduled for extraction Monday 11/2. When should he stop his Coumadin? When to resume?

## 2014-07-31 NOTE — Telephone Encounter (Signed)
Is ot OK for pt to hold coumadin 3 days for dental extraction?

## 2014-07-31 NOTE — Telephone Encounter (Signed)
Follow up     Office calling back to speak with nurse, pt has abscess tooth / pain. Need to get tooth out on Monday.

## 2014-08-01 NOTE — Telephone Encounter (Signed)
Elease Hashimoto is aware that Dr. Graciela Husbands agreed for pt  to stop coumadin today and restart medication on 08/06/14. Elease Hashimoto states that pt rescheduled the tooth extraction for Tuesday November 3 rd , so he will stop taking the medication tomorrow Friday 10/30 and restart the  Medication on 08/07/14.

## 2014-08-01 NOTE — Telephone Encounter (Signed)
Follow up     Pt is due to have a tooth extracted on Monday.  They need to know today if he can stop his coumadin.  Please fax note to [phone].

## 2014-08-14 ENCOUNTER — Other Ambulatory Visit: Payer: Self-pay

## 2014-08-14 MED ORDER — LOSARTAN POTASSIUM 50 MG PO TABS: 25.0000 mg | ORAL_TABLET | Freq: Every day | ORAL | Status: AC

## 2014-10-10 ENCOUNTER — Ambulatory Visit (INDEPENDENT_AMBULATORY_CARE_PROVIDER_SITE_OTHER): Payer: Medicare Other | Admitting: *Deleted

## 2014-10-10 DIAGNOSIS — Z5181 Encounter for therapeutic drug level monitoring: Secondary | ICD-10-CM | POA: Diagnosis not present

## 2014-10-10 DIAGNOSIS — I4891 Unspecified atrial fibrillation: Secondary | ICD-10-CM

## 2014-10-10 DIAGNOSIS — Z7901 Long term (current) use of anticoagulants: Secondary | ICD-10-CM | POA: Diagnosis not present

## 2014-10-10 LAB — POCT INR: INR: 1.7

## 2014-10-31 ENCOUNTER — Ambulatory Visit (INDEPENDENT_AMBULATORY_CARE_PROVIDER_SITE_OTHER): Payer: Medicare Other | Admitting: *Deleted

## 2014-10-31 DIAGNOSIS — Z5181 Encounter for therapeutic drug level monitoring: Secondary | ICD-10-CM | POA: Diagnosis not present

## 2014-10-31 DIAGNOSIS — I4891 Unspecified atrial fibrillation: Secondary | ICD-10-CM

## 2014-10-31 DIAGNOSIS — Z7901 Long term (current) use of anticoagulants: Secondary | ICD-10-CM | POA: Diagnosis not present

## 2014-10-31 LAB — POCT INR: INR: 1.7

## 2014-11-21 ENCOUNTER — Ambulatory Visit (INDEPENDENT_AMBULATORY_CARE_PROVIDER_SITE_OTHER): Payer: Medicare Other | Admitting: *Deleted

## 2014-11-21 DIAGNOSIS — I480 Paroxysmal atrial fibrillation: Secondary | ICD-10-CM

## 2014-11-21 DIAGNOSIS — Z7901 Long term (current) use of anticoagulants: Secondary | ICD-10-CM

## 2014-11-21 DIAGNOSIS — I4891 Unspecified atrial fibrillation: Secondary | ICD-10-CM

## 2014-11-21 DIAGNOSIS — Z5181 Encounter for therapeutic drug level monitoring: Secondary | ICD-10-CM | POA: Diagnosis not present

## 2014-11-21 LAB — POCT INR: INR: 2.3

## 2014-11-26 DIAGNOSIS — I480 Paroxysmal atrial fibrillation: Secondary | ICD-10-CM | POA: Diagnosis not present

## 2014-11-26 DIAGNOSIS — E119 Type 2 diabetes mellitus without complications: Secondary | ICD-10-CM | POA: Diagnosis not present

## 2014-11-26 DIAGNOSIS — E039 Hypothyroidism, unspecified: Secondary | ICD-10-CM | POA: Diagnosis not present

## 2014-11-26 DIAGNOSIS — Z6829 Body mass index (BMI) 29.0-29.9, adult: Secondary | ICD-10-CM | POA: Diagnosis not present

## 2014-11-26 DIAGNOSIS — I10 Essential (primary) hypertension: Secondary | ICD-10-CM | POA: Diagnosis not present

## 2014-11-26 DIAGNOSIS — I472 Ventricular tachycardia: Secondary | ICD-10-CM | POA: Diagnosis not present

## 2014-12-22 DIAGNOSIS — Z7901 Long term (current) use of anticoagulants: Secondary | ICD-10-CM | POA: Diagnosis not present

## 2014-12-22 DIAGNOSIS — Z951 Presence of aortocoronary bypass graft: Secondary | ICD-10-CM | POA: Diagnosis not present

## 2014-12-22 DIAGNOSIS — S42254A Nondisplaced fracture of greater tuberosity of right humerus, initial encounter for closed fracture: Secondary | ICD-10-CM | POA: Diagnosis not present

## 2014-12-22 DIAGNOSIS — Z8673 Personal history of transient ischemic attack (TIA), and cerebral infarction without residual deficits: Secondary | ICD-10-CM | POA: Diagnosis not present

## 2014-12-22 DIAGNOSIS — Z7982 Long term (current) use of aspirin: Secondary | ICD-10-CM | POA: Diagnosis not present

## 2014-12-22 DIAGNOSIS — S42251A Displaced fracture of greater tuberosity of right humerus, initial encounter for closed fracture: Secondary | ICD-10-CM | POA: Diagnosis not present

## 2014-12-22 DIAGNOSIS — Z79899 Other long term (current) drug therapy: Secondary | ICD-10-CM | POA: Diagnosis not present

## 2014-12-22 DIAGNOSIS — M25511 Pain in right shoulder: Secondary | ICD-10-CM | POA: Diagnosis not present

## 2014-12-22 DIAGNOSIS — W19XXXA Unspecified fall, initial encounter: Secondary | ICD-10-CM | POA: Diagnosis not present

## 2014-12-22 DIAGNOSIS — E119 Type 2 diabetes mellitus without complications: Secondary | ICD-10-CM | POA: Diagnosis not present

## 2014-12-22 DIAGNOSIS — Z95810 Presence of automatic (implantable) cardiac defibrillator: Secondary | ICD-10-CM | POA: Diagnosis not present

## 2014-12-22 DIAGNOSIS — S42294A Other nondisplaced fracture of upper end of right humerus, initial encounter for closed fracture: Secondary | ICD-10-CM | POA: Diagnosis not present

## 2014-12-22 DIAGNOSIS — I10 Essential (primary) hypertension: Secondary | ICD-10-CM | POA: Diagnosis not present

## 2014-12-23 ENCOUNTER — Telehealth: Payer: Self-pay | Admitting: *Deleted

## 2014-12-23 NOTE — Telephone Encounter (Signed)
[name] (step-daughter) called stating that Mrs. Dsouza is a patient at [facility] from a fall. States her father does not drive will call the office When he is able to come in for a coumdin check.   You may call [name] at [phone]

## 2014-12-23 NOTE — Telephone Encounter (Signed)
Spoke with [name].  INR appt made for Mr Lepage on 01/02/15.

## 2014-12-24 DIAGNOSIS — S42254A Nondisplaced fracture of greater tuberosity of right humerus, initial encounter for closed fracture: Secondary | ICD-10-CM | POA: Diagnosis not present

## 2015-02-09 DIAGNOSIS — I499 Cardiac arrhythmia, unspecified: Secondary | ICD-10-CM | POA: Diagnosis not present

## 2015-02-09 DIAGNOSIS — Z951 Presence of aortocoronary bypass graft: Secondary | ICD-10-CM | POA: Diagnosis not present

## 2015-02-09 DIAGNOSIS — I519 Heart disease, unspecified: Secondary | ICD-10-CM | POA: Diagnosis not present

## 2015-02-09 DIAGNOSIS — R197 Diarrhea, unspecified: Secondary | ICD-10-CM | POA: Diagnosis not present

## 2015-02-09 DIAGNOSIS — Z950 Presence of cardiac pacemaker: Secondary | ICD-10-CM | POA: Diagnosis not present

## 2015-02-09 DIAGNOSIS — I10 Essential (primary) hypertension: Secondary | ICD-10-CM | POA: Diagnosis not present

## 2015-02-09 DIAGNOSIS — E119 Type 2 diabetes mellitus without complications: Secondary | ICD-10-CM | POA: Diagnosis not present

## 2015-02-09 DIAGNOSIS — R05 Cough: Secondary | ICD-10-CM | POA: Diagnosis not present

## 2015-02-09 DIAGNOSIS — R109 Unspecified abdominal pain: Secondary | ICD-10-CM | POA: Diagnosis not present

## 2015-02-09 DIAGNOSIS — E860 Dehydration: Secondary | ICD-10-CM | POA: Diagnosis not present

## 2015-02-09 DIAGNOSIS — Z7901 Long term (current) use of anticoagulants: Secondary | ICD-10-CM | POA: Diagnosis not present

## 2015-02-09 DIAGNOSIS — N289 Disorder of kidney and ureter, unspecified: Secondary | ICD-10-CM | POA: Diagnosis not present

## 2015-02-09 DIAGNOSIS — R0682 Tachypnea, not elsewhere classified: Secondary | ICD-10-CM | POA: Diagnosis not present

## 2015-02-09 DIAGNOSIS — Z8673 Personal history of transient ischemic attack (TIA), and cerebral infarction without residual deficits: Secondary | ICD-10-CM | POA: Diagnosis not present

## 2015-02-09 DIAGNOSIS — Z7982 Long term (current) use of aspirin: Secondary | ICD-10-CM | POA: Diagnosis not present

## 2015-02-09 DIAGNOSIS — Z79899 Other long term (current) drug therapy: Secondary | ICD-10-CM | POA: Diagnosis not present

## 2015-02-13 DIAGNOSIS — Z79899 Other long term (current) drug therapy: Secondary | ICD-10-CM | POA: Diagnosis not present

## 2015-02-13 DIAGNOSIS — Z8673 Personal history of transient ischemic attack (TIA), and cerebral infarction without residual deficits: Secondary | ICD-10-CM | POA: Diagnosis not present

## 2015-02-13 DIAGNOSIS — Z7982 Long term (current) use of aspirin: Secondary | ICD-10-CM | POA: Diagnosis not present

## 2015-02-13 DIAGNOSIS — E119 Type 2 diabetes mellitus without complications: Secondary | ICD-10-CM | POA: Diagnosis not present

## 2015-02-13 DIAGNOSIS — G8929 Other chronic pain: Secondary | ICD-10-CM | POA: Diagnosis not present

## 2015-02-13 DIAGNOSIS — K5900 Constipation, unspecified: Secondary | ICD-10-CM | POA: Diagnosis not present

## 2015-02-13 DIAGNOSIS — Z7901 Long term (current) use of anticoagulants: Secondary | ICD-10-CM | POA: Diagnosis not present

## 2015-02-13 DIAGNOSIS — I10 Essential (primary) hypertension: Secondary | ICD-10-CM | POA: Diagnosis not present

## 2015-02-13 DIAGNOSIS — Z951 Presence of aortocoronary bypass graft: Secondary | ICD-10-CM | POA: Diagnosis not present

## 2015-02-14 ENCOUNTER — Inpatient Hospital Stay (HOSPITAL_COMMUNITY)
Admission: EM | Admit: 2015-02-14 | Discharge: 2015-02-19 | DRG: 309 | Discharge status: Against Medical Advice | Payer: Medicare Other | Attending: Internal Medicine | Admitting: Internal Medicine

## 2015-02-14 ENCOUNTER — Encounter (HOSPITAL_COMMUNITY): Payer: Self-pay | Admitting: *Deleted

## 2015-02-14 DIAGNOSIS — E785 Hyperlipidemia, unspecified: Secondary | ICD-10-CM | POA: Diagnosis present

## 2015-02-14 DIAGNOSIS — R197 Diarrhea, unspecified: Secondary | ICD-10-CM | POA: Diagnosis not present

## 2015-02-14 DIAGNOSIS — E1122 Type 2 diabetes mellitus with diabetic chronic kidney disease: Secondary | ICD-10-CM | POA: Diagnosis present

## 2015-02-14 DIAGNOSIS — N183 Chronic kidney disease, stage 3 unspecified: Secondary | ICD-10-CM | POA: Diagnosis present

## 2015-02-14 DIAGNOSIS — A084 Viral intestinal infection, unspecified: Secondary | ICD-10-CM | POA: Diagnosis present

## 2015-02-14 DIAGNOSIS — I501 Left ventricular failure: Secondary | ICD-10-CM | POA: Diagnosis not present

## 2015-02-14 DIAGNOSIS — R8290 Unspecified abnormal findings in urine: Secondary | ICD-10-CM | POA: Clinically undetermined

## 2015-02-14 DIAGNOSIS — E860 Dehydration: Secondary | ICD-10-CM | POA: Diagnosis not present

## 2015-02-14 DIAGNOSIS — Z7901 Long term (current) use of anticoagulants: Secondary | ICD-10-CM

## 2015-02-14 DIAGNOSIS — I2510 Atherosclerotic heart disease of native coronary artery without angina pectoris: Secondary | ICD-10-CM | POA: Diagnosis present

## 2015-02-14 DIAGNOSIS — I480 Paroxysmal atrial fibrillation: Secondary | ICD-10-CM | POA: Diagnosis present

## 2015-02-14 DIAGNOSIS — H9190 Unspecified hearing loss, unspecified ear: Secondary | ICD-10-CM | POA: Diagnosis present

## 2015-02-14 DIAGNOSIS — I4891 Unspecified atrial fibrillation: Secondary | ICD-10-CM | POA: Diagnosis not present

## 2015-02-14 DIAGNOSIS — I482 Chronic atrial fibrillation: Secondary | ICD-10-CM | POA: Diagnosis not present

## 2015-02-14 DIAGNOSIS — E0590 Thyrotoxicosis, unspecified without thyrotoxic crisis or storm: Secondary | ICD-10-CM | POA: Diagnosis present

## 2015-02-14 DIAGNOSIS — I517 Cardiomegaly: Secondary | ICD-10-CM | POA: Diagnosis not present

## 2015-02-14 DIAGNOSIS — F329 Major depressive disorder, single episode, unspecified: Secondary | ICD-10-CM | POA: Diagnosis present

## 2015-02-14 DIAGNOSIS — G8929 Other chronic pain: Secondary | ICD-10-CM | POA: Diagnosis not present

## 2015-02-14 DIAGNOSIS — R0600 Dyspnea, unspecified: Secondary | ICD-10-CM | POA: Diagnosis not present

## 2015-02-14 DIAGNOSIS — Z7982 Long term (current) use of aspirin: Secondary | ICD-10-CM | POA: Diagnosis not present

## 2015-02-14 DIAGNOSIS — K219 Gastro-esophageal reflux disease without esophagitis: Secondary | ICD-10-CM | POA: Diagnosis present

## 2015-02-14 DIAGNOSIS — R001 Bradycardia, unspecified: Secondary | ICD-10-CM | POA: Diagnosis not present

## 2015-02-14 DIAGNOSIS — Z79899 Other long term (current) drug therapy: Secondary | ICD-10-CM | POA: Diagnosis not present

## 2015-02-14 DIAGNOSIS — I442 Atrioventricular block, complete: Secondary | ICD-10-CM | POA: Diagnosis present

## 2015-02-14 DIAGNOSIS — N179 Acute kidney failure, unspecified: Secondary | ICD-10-CM | POA: Diagnosis present

## 2015-02-14 DIAGNOSIS — R791 Abnormal coagulation profile: Secondary | ICD-10-CM | POA: Diagnosis present

## 2015-02-14 DIAGNOSIS — R0681 Apnea, not elsewhere classified: Secondary | ICD-10-CM | POA: Diagnosis not present

## 2015-02-14 DIAGNOSIS — Z951 Presence of aortocoronary bypass graft: Secondary | ICD-10-CM

## 2015-02-14 DIAGNOSIS — D649 Anemia, unspecified: Secondary | ICD-10-CM | POA: Diagnosis present

## 2015-02-14 DIAGNOSIS — I129 Hypertensive chronic kidney disease with stage 1 through stage 4 chronic kidney disease, or unspecified chronic kidney disease: Secondary | ICD-10-CM | POA: Diagnosis present

## 2015-02-14 DIAGNOSIS — K5900 Constipation, unspecified: Secondary | ICD-10-CM | POA: Diagnosis not present

## 2015-02-14 DIAGNOSIS — R7889 Finding of other specified substances, not normally found in blood: Secondary | ICD-10-CM | POA: Diagnosis not present

## 2015-02-14 DIAGNOSIS — E872 Acidosis: Secondary | ICD-10-CM | POA: Diagnosis present

## 2015-02-14 DIAGNOSIS — G4730 Sleep apnea, unspecified: Secondary | ICD-10-CM | POA: Diagnosis present

## 2015-02-14 DIAGNOSIS — R404 Transient alteration of awareness: Secondary | ICD-10-CM | POA: Diagnosis not present

## 2015-02-14 DIAGNOSIS — R310 Gross hematuria: Secondary | ICD-10-CM | POA: Diagnosis present

## 2015-02-14 DIAGNOSIS — N400 Enlarged prostate without lower urinary tract symptoms: Secondary | ICD-10-CM | POA: Diagnosis present

## 2015-02-14 DIAGNOSIS — E1129 Type 2 diabetes mellitus with other diabetic kidney complication: Secondary | ICD-10-CM | POA: Diagnosis present

## 2015-02-14 DIAGNOSIS — I255 Ischemic cardiomyopathy: Secondary | ICD-10-CM | POA: Diagnosis present

## 2015-02-14 DIAGNOSIS — F419 Anxiety disorder, unspecified: Secondary | ICD-10-CM | POA: Diagnosis present

## 2015-02-14 DIAGNOSIS — I5022 Chronic systolic (congestive) heart failure: Secondary | ICD-10-CM | POA: Diagnosis not present

## 2015-02-14 DIAGNOSIS — I441 Atrioventricular block, second degree: Secondary | ICD-10-CM | POA: Insufficient documentation

## 2015-02-14 DIAGNOSIS — R531 Weakness: Secondary | ICD-10-CM | POA: Diagnosis not present

## 2015-02-14 DIAGNOSIS — E039 Hypothyroidism, unspecified: Secondary | ICD-10-CM | POA: Diagnosis present

## 2015-02-14 LAB — COMPREHENSIVE METABOLIC PANEL
ALBUMIN: 3.5 g/dL (ref 3.5–5.0)
ALK PHOS: 60 U/L (ref 38–126)
ALT: 107 U/L — ABNORMAL HIGH (ref 17–63)
AST: 80 U/L — AB (ref 15–41)
Anion gap: 12 (ref 5–15)
BILIRUBIN TOTAL: 2.2 mg/dL — AB (ref 0.3–1.2)
BUN: 49 mg/dL — ABNORMAL HIGH (ref 6–20)
CO2: 23 mmol/L (ref 22–32)
Calcium: 8.5 mg/dL — ABNORMAL LOW (ref 8.9–10.3)
Chloride: 102 mmol/L (ref 101–111)
Creatinine, Ser: 1.78 mg/dL — ABNORMAL HIGH (ref 0.61–1.24)
GFR calc Af Amer: 41 mL/min — ABNORMAL LOW (ref >60)
GFR calc non Af Amer: 35 mL/min — ABNORMAL LOW (ref >60)
GLUCOSE: 157 mg/dL — AB (ref 65–99)
POTASSIUM: 4 mmol/L (ref 3.5–5.1)
Sodium: 137 mmol/L (ref 135–145)
TOTAL PROTEIN: 6.2 g/dL — AB (ref 6.5–8.1)

## 2015-02-14 LAB — I-STAT CHEM 8, ED
BUN: 42 mg/dL — ABNORMAL HIGH (ref 6–20)
CALCIUM ION: 1.1 mmol/L — AB (ref 1.13–1.30)
Chloride: 101 mmol/L (ref 101–111)
Creatinine, Ser: 1.7 mg/dL — ABNORMAL HIGH (ref 0.61–1.24)
Glucose, Bld: 155 mg/dL — ABNORMAL HIGH (ref 65–99)
HCT: 34 % — ABNORMAL LOW (ref 39.0–52.0)
HEMOGLOBIN: 11.6 g/dL — AB (ref 13.0–17.0)
POTASSIUM: 3.9 mmol/L (ref 3.5–5.1)
Sodium: 138 mmol/L (ref 135–145)
TCO2: 20 mmol/L (ref 0–100)

## 2015-02-14 LAB — MAGNESIUM: Magnesium: 3.2 mg/dL — ABNORMAL HIGH (ref 1.7–2.4)

## 2015-02-14 LAB — CLOSTRIDIUM DIFFICILE BY PCR: Toxigenic C. Difficile by PCR: NEGATIVE

## 2015-02-14 LAB — CBC
HEMATOCRIT: 33.8 % — AB (ref 39.0–52.0)
HEMOGLOBIN: 10.7 g/dL — AB (ref 13.0–17.0)
MCH: 29.8 pg (ref 26.0–34.0)
MCHC: 31.7 g/dL (ref 30.0–36.0)
MCV: 94.2 fL (ref 78.0–100.0)
Platelets: 279 10*3/uL (ref 150–400)
RBC: 3.59 MIL/uL — AB (ref 4.22–5.81)
RDW: 14.9 % (ref 11.5–15.5)
WBC: 9.3 10*3/uL (ref 4.0–10.5)

## 2015-02-14 LAB — LIPASE, BLOOD: Lipase: 24 U/L (ref 22–51)

## 2015-02-14 LAB — ABO/RH: ABO/RH(D): A POS

## 2015-02-14 LAB — I-STAT CG4 LACTIC ACID, ED: Lactic Acid, Venous: 2.3 mmol/L (ref 0.5–2.0)

## 2015-02-14 LAB — PROTIME-INR: Prothrombin Time: >90 seconds — ABNORMAL HIGH (ref 11.6–15.2)

## 2015-02-14 MED ORDER — ACETAMINOPHEN 325 MG PO TABS
650.0000 mg | ORAL_TABLET | Freq: Four times a day (QID) | ORAL | Status: DC | PRN
  Administered 2015-02-17: 650 mg via ORAL
  Filled 2015-02-14: qty 2

## 2015-02-14 MED ORDER — LOPERAMIDE HCL 2 MG PO CAPS
2.0000 mg | ORAL_CAPSULE | ORAL | Status: DC | PRN
  Administered 2015-02-15 – 2015-02-18 (×2): 2 mg via ORAL
  Filled 2015-02-14 (×2): qty 1

## 2015-02-14 MED ORDER — LINAGLIPTIN 5 MG PO TABS
5.0000 mg | ORAL_TABLET | Freq: Every day | ORAL | Status: DC
  Administered 2015-02-15: 5 mg via ORAL
  Filled 2015-02-14: qty 1

## 2015-02-14 MED ORDER — ACETAMINOPHEN 650 MG RE SUPP: 650.0000 mg | Freq: Four times a day (QID) | RECTAL | Status: DC | PRN

## 2015-02-14 MED ORDER — ALPRAZOLAM 0.5 MG PO TABS
0.5000 mg | ORAL_TABLET | Freq: Three times a day (TID) | ORAL | Status: DC | PRN
  Administered 2015-02-16: 0.5 mg via ORAL
  Filled 2015-02-14: qty 1

## 2015-02-14 MED ORDER — PRAVASTATIN SODIUM 20 MG PO TABS
20.0000 mg | ORAL_TABLET | Freq: Every day | ORAL | Status: DC
  Administered 2015-02-15 – 2015-02-18 (×4): 20 mg via ORAL
  Filled 2015-02-14 (×5): qty 1

## 2015-02-14 MED ORDER — FINASTERIDE 5 MG PO TABS
5.0000 mg | ORAL_TABLET | Freq: Every day | ORAL | Status: DC
  Administered 2015-02-15 – 2015-02-19 (×5): 5 mg via ORAL
  Filled 2015-02-14 (×5): qty 1

## 2015-02-14 MED ORDER — LEVOTHYROXINE SODIUM 25 MCG PO TABS
25.0000 ug | ORAL_TABLET | Freq: Every day | ORAL | Status: DC
  Administered 2015-02-15 – 2015-02-16 (×2): 25 ug via ORAL
  Filled 2015-02-14 (×2): qty 1

## 2015-02-14 MED ORDER — AMIODARONE HCL 100 MG PO TABS: 100.0000 mg | ORAL_TABLET | ORAL | Status: DC

## 2015-02-14 MED ORDER — ESCITALOPRAM OXALATE 10 MG PO TABS
10.0000 mg | ORAL_TABLET | Freq: Every day | ORAL | Status: DC
  Administered 2015-02-15 – 2015-02-16 (×2): 10 mg via ORAL
  Filled 2015-02-14 (×2): qty 1

## 2015-02-14 MED ORDER — LOSARTAN POTASSIUM 25 MG PO TABS
25.0000 mg | ORAL_TABLET | Freq: Every day | ORAL | Status: DC
  Administered 2015-02-15 – 2015-02-19 (×5): 25 mg via ORAL
  Filled 2015-02-14 (×5): qty 1

## 2015-02-14 MED ORDER — SODIUM CHLORIDE 0.9 % IV SOLN
INTRAVENOUS | Status: DC
  Administered 2015-02-15 (×2): via INTRAVENOUS

## 2015-02-14 MED ORDER — ONDANSETRON HCL 4 MG PO TABS: 4.0000 mg | ORAL_TABLET | Freq: Four times a day (QID) | ORAL | Status: DC | PRN

## 2015-02-14 MED ORDER — SODIUM CHLORIDE 0.9 % IJ SOLN
3.0000 mL | Freq: Two times a day (BID) | INTRAMUSCULAR | Status: DC
  Administered 2015-02-15 – 2015-02-19 (×7): 3 mL via INTRAVENOUS

## 2015-02-14 MED ORDER — VITAMIN K1 10 MG/ML IJ SOLN
5.0000 mg | Freq: Once | INTRAMUSCULAR | Status: AC
  Administered 2015-02-14: 5 mg via SUBCUTANEOUS
  Filled 2015-02-14: qty 1

## 2015-02-14 MED ORDER — ONDANSETRON HCL 4 MG/2ML IJ SOLN
4.0000 mg | Freq: Four times a day (QID) | INTRAMUSCULAR | Status: DC | PRN
  Administered 2015-02-16: 4 mg via INTRAVENOUS
  Filled 2015-02-14: qty 2

## 2015-02-14 MED ORDER — SODIUM CHLORIDE 0.9 % IV BOLUS (SEPSIS)
1000.0000 mL | Freq: Once | INTRAVENOUS | Status: AC
  Administered 2015-02-14: 1000 mL via INTRAVENOUS

## 2015-02-14 MED ORDER — SODIUM CHLORIDE 0.9 % IV SOLN: 10.0000 mL/h | Freq: Once | INTRAVENOUS | Status: DC

## 2015-02-14 MED ORDER — ALUM & MAG HYDROXIDE-SIMETH 200-200-20 MG/5ML PO SUSP
30.0000 mL | Freq: Four times a day (QID) | ORAL | Status: DC | PRN
  Administered 2015-02-16 – 2015-02-19 (×4): 30 mL via ORAL
  Filled 2015-02-14 (×4): qty 30

## 2015-02-14 NOTE — ED Notes (Signed)
Pt has EMS bolus running.

## 2015-02-14 NOTE — Consult Note (Signed)
Reason for Consult: bradycardia Primary Cardiologist: Dr. Caryl Comes Referring Physician: Dr. Celso [name] is an 80 y.o. male.  HPI: Mr. [name] is a 80 yo man with PMH of CAD s/p 5v CABG with subsequent development of ischemic cardiomopathy with last known EF 20-35% by 5/14 echo, previous ICD with explantation 2/2 infection, atrial fibrillation on warfarin, dyslipidemia, hypertension, GERD who presented to [facility] with several days of diarrhea. He was found to also have supratherapeutic INR and bradycardia leading to transfer to [facility]. He is on amiodarone for previous VT and rhythm control of his atrial fibrillation. He also has decreased appetite along with his diarrhea with poor PO intake. He tells me his wife has been in rehab for 4 weeks and she previously helped with his pills. He believes he took too many amiodarone and warfarin. He started receiving FFP at OSH. No sick contacts or recent antibiotic use.   Past Medical History  Diagnosis Date  . Chest discomfort   . Low back pain   . Ischemic cardiomyopathy     EF 23% => improved to 40-45%;  Echo 5/14:  Mild LVH, EF 20-25%, mid to distal anteroseptal, anterior, inferior and apical AK, mild AI, mean AV gradient 4, moderate MR(Limited interrogation), moderate LAE, mild reduced RVSF, mild to moderate TR, peak RV-RA gradient 40  . Hearing loss   . SOB (shortness of breath)   . Joint pain   . VT (ventricular tachycardia)     s/p ICD explanted for infection.  The decision has been not to reimplant this given these difficulties.  . Anxiety   . Depression   . PAF (paroxysmal atrial fibrillation)   . Hyperlipidemia   . Gynecomastia   . BPH (benign prostatic hypertrophy)   . Diabetes mellitus   . Hypertension   . Degenerative joint disease   . GERD (gastroesophageal reflux disease)   . CAD (coronary artery disease)   . Pneumonia     Past Surgical History  Procedure Laterality Date  . Cardiac defibrillator removal     . Coronary artery bypass graft  2000    X5  . Cardiac catheterization  12/25/2008    THE LEFT VENTRICLE WAS ENLARGED. THERE IS ANTERIOR APICAL AND INFERIOR APICAL AKINESIA. EF ESTIMATED 20%. NO MITRAL REGURGITATION.  . Cardiac catheterization  12/2008    REPEAT CATH, SHOWED BYPASS GRAFTS WERE PATENT  . US echocardiography  2007    EF 20 to 25%  . Ablation of dysrhythmic focus    . Pars plana vitrectomy  09/24/2011    Procedure: PARS PLANA VITRECTOMY WITH 25 GAUGE;  Surgeon: Clent Demark Rankin;  Location: [facility];  Service: Ophthalmology;  Laterality: Left;  MEMBRANE PEEL, SILICONE OIL  . Cardioversion N/A 11/20/2012    Procedure: CARDIOVERSION;  Surgeon: Fay Records, MD;  Location: [facility];  Service: Cardiovascular;  Laterality: N/A;  . Cardioversion N/A 12/28/2012    Procedure: CARDIOVERSION;  Surgeon: Darlin Coco, MD;  Location: [facility] ENDOSCOPY;  Service: Cardiovascular;  Laterality: N/A;  . Cataract extraction, bilateral      No family history on file.  Social History:  reports that he has never smoked. He has never used smokeless tobacco. He reports that he does not drink alcohol or use illicit drugs.  Allergies: No Known Allergies  Medications:  I have reviewed the patient's current medications. Prior to Admission:  Prescriptions prior to admission  Medication Sig Dispense Refill Last Dose  . ALPRAZolam (XANAX) 0.5 MG tablet  Take 0.5 mg by mouth 3 (three) times daily as needed. For anxiety.   Taking  . amiodarone (PACERONE) 200 MG tablet Take 1/2 tablet (100 mg total) by mouth 5 days a week. 15 tablet 6   . escitalopram (LEXAPRO) 10 MG tablet Take 10 mg by mouth daily.     Taking  . finasteride (PROSCAR) 5 MG tablet Take 5 mg by mouth daily.     Taking  . levothyroxine (SYNTHROID, LEVOTHROID) 50 MCG tablet TAKE 1/2 TABLET BY MOUTH EVERY DAY   Taking  . losartan (COZAAR) 50 MG tablet Take 0.5 tablets (25 mg total) by mouth daily. 30 tablet 6   . pravastatin (PRAVACHOL) 20 MG  tablet Take 20 mg by mouth daily.    Taking  . sitaGLIPtin (JANUVIA) 50 MG tablet Take 25 mg by mouth as directed. 25 mg twice a week   Taking  . warfarin (COUMADIN) 5 MG tablet Take 1/2 tablet daily or as directed 30 tablet 3    Scheduled: . sodium chloride  10 mL/hr Intravenous Once  . amiodarone  100 mg Oral See admin instructions  . [START ON 02/15/2015] escitalopram  10 mg Oral Daily  . [START ON 02/15/2015] finasteride  5 mg Oral Daily  . [START ON 02/15/2015] levothyroxine  25 mcg Oral QAC breakfast  . [START ON 02/15/2015] linagliptin  5 mg Oral Daily  . [START ON 02/15/2015] losartan  25 mg Oral Daily  . [START ON 02/15/2015] pravastatin  20 mg Oral Daily  . sodium chloride  3 mL Intravenous Q12H    Results for orders placed or performed during the hospital encounter of 02/14/15 (from the past 48 hour(s))  CBC     Status: Abnormal   Collection Time: 02/14/15  5:44 PM  Result Value Ref Range   WBC 9.3 4.0 - 10.5 K/uL   RBC 3.59 (L) 4.22 - 5.81 MIL/uL   Hemoglobin 10.7 (L) 13.0 - 17.0 g/dL   HCT 33.8 (L) 39.0 - 52.0 %   MCV 94.2 78.0 - 100.0 fL   MCH 29.8 26.0 - 34.0 pg   MCHC 31.7 30.0 - 36.0 g/dL   RDW 14.9 11.5 - 15.5 %   Platelets 279 150 - 400 K/uL  Comprehensive metabolic panel     Status: Abnormal   Collection Time: 02/14/15  5:44 PM  Result Value Ref Range   Sodium 137 135 - 145 mmol/L   Potassium 4.0 3.5 - 5.1 mmol/L   Chloride 102 101 - 111 mmol/L   CO2 23 22 - 32 mmol/L   Glucose, Bld 157 (H) 65 - 99 mg/dL   BUN 49 (H) 6 - 20 mg/dL   Creatinine, Ser 1.78 (H) 0.61 - 1.24 mg/dL   Calcium 8.5 (L) 8.9 - 10.3 mg/dL   Total Protein 6.2 (L) 6.5 - 8.1 g/dL   Albumin 3.5 3.5 - 5.0 g/dL   AST 80 (H) 15 - 41 U/L   ALT 107 (H) 17 - 63 U/L   Alkaline Phosphatase 60 38 - 126 U/L   Total Bilirubin 2.2 (H) 0.3 - 1.2 mg/dL   GFR calc non Af Amer 35 (L) >60 mL/min   GFR calc Af Amer 41 (L) >60 mL/min    Comment: (NOTE) The eGFR has been calculated using the CKD EPI  equation. This calculation has not been validated in all clinical situations. eGFR's persistently <60 mL/min signify possible Chronic Kidney Disease.    Anion gap 12 5 - 15  Lipase, blood  Status: None   Collection Time: 02/14/15  5:44 PM  Result Value Ref Range   Lipase 24 22 - 51 U/L  Protime-INR     Status: Abnormal   Collection Time: 02/14/15  5:44 PM  Result Value Ref Range   Prothrombin Time >90.0 (H) 11.6 - 15.2 seconds   INR >10.00 (HH) 0.00 - 1.49    Comment: RESULT REPEATED AND VERIFIED CRITICAL RESULT CALLED TO, READ BACK BY AND VERIFIED WITH: VOGLER,T @ 1832 ON 02/14/15 BY WOODIE,J   I-stat Chem 8, ED     Status: Abnormal   Collection Time: 02/14/15  6:03 PM  Result Value Ref Range   Sodium 138 135 - 145 mmol/L   Potassium 3.9 3.5 - 5.1 mmol/L   Chloride 101 101 - 111 mmol/L   BUN 42 (H) 6 - 20 mg/dL   Creatinine, Ser 1.70 (H) 0.61 - 1.24 mg/dL   Glucose, Bld 155 (H) 65 - 99 mg/dL   Calcium, Ion 1.10 (L) 1.13 - 1.30 mmol/L   TCO2 20 0 - 100 mmol/L   Hemoglobin 11.6 (L) 13.0 - 17.0 g/dL   HCT 34.0 (L) 39.0 - 52.0 %  I-Stat CG4 Lactic Acid, ED     Status: Abnormal   Collection Time: 02/14/15  6:15 PM  Result Value Ref Range   Lactic Acid, Venous 2.30 (HH) 0.5 - 2.0 mmol/L  Prepare fresh frozen plasma     Status: None (Preliminary result)   Collection Time: 02/14/15  8:04 PM  Result Value Ref Range   Unit Number [id number]    Blood Component Type THAWED PLASMA    Unit division 00    Status of Unit ISSUED    Transfusion Status OK TO TRANSFUSE   ABO/Rh     Status: None   Collection Time: 02/14/15  8:04 PM  Result Value Ref Range   ABO/RH(D) A POS   Magnesium     Status: Abnormal   Collection Time: 02/14/15  8:04 PM  Result Value Ref Range   Magnesium 3.2 (H) 1.7 - 2.4 mg/dL  Clostridium Difficile by PCR     Status: None   Collection Time: 02/14/15  8:15 PM  Result Value Ref Range   C difficile by pcr NEGATIVE NEGATIVE    No results  found.  Review of Systems  Constitutional: Positive for weight loss and malaise/fatigue. Negative for fever and chills.  HENT: Negative for ear pain.   Eyes: Negative for double vision and photophobia.  Respiratory: Negative for cough, hemoptysis and shortness of breath.   Cardiovascular: Negative for chest pain, palpitations and leg swelling.  Gastrointestinal: Positive for diarrhea. Negative for nausea and vomiting.  Genitourinary: Negative for dysuria and frequency.  Musculoskeletal: Negative for back pain and neck pain.  Skin: Negative for rash.  Neurological: Positive for weakness. Negative for tingling, tremors and headaches.  Endo/Heme/Allergies: Negative for polydipsia. Bruises/bleeds easily.  Psychiatric/Behavioral: Negative for suicidal ideas, hallucinations and substance abuse.   Blood pressure 138/70, pulse 35, temperature 97.5 F (36.4 C), temperature source Oral, resp. rate 17, SpO2 98 %. Physical Exam  Nursing note and vitals reviewed. Constitutional: He is oriented to person, place, and time. He appears well-developed and well-nourished. No distress.  HENT:  Head: Normocephalic and atraumatic.  Nose: Nose normal.  Mouth/Throat: Oropharynx is clear and moist. No oropharyngeal exudate.  Eyes: Conjunctivae and EOM are normal. Pupils are equal, round, and reactive to light. No scleral icterus.  Neck: Normal range of motion. Neck supple. No JVD  present. No tracheal deviation present.  jvp flat  Cardiovascular: Regular rhythm, normal heart sounds and intact distal pulses.  Exam reveals no gallop.   No murmur heard. bradycardic  Respiratory: Effort normal and breath sounds normal. He has no wheezes. He has no rales.  GI: Soft. Bowel sounds are normal. He exhibits no distension. There is no tenderness. There is no rebound.  Musculoskeletal: Normal range of motion. He exhibits no edema.  Neurological: He is alert and oriented to person, place, and time. No cranial nerve  deficit.  Skin: No rash noted. He is not diaphoretic. No erythema.  Lukewarm extremities  Psychiatric: He has a normal mood and affect. His behavior is normal. Thought content normal.   Labs reviewed; wbc 9.3, h/h 11.6/34, plt 279, ast/alt 80/107, Cr 1.7, K 3.9, na 138, Mg 3.2, INR > 10, lactate 2.3 EKG: HR 31, junctional bradycardia  5/14 Echo: 20-25%, mild LVH, WMA  Assessment/Plan: Mr. [name] is a 80 yo man with PMH of CAD s/p 5v CABG with subsequent development of ischemic cardiomopathy with last known EF 20-35% by 5/14 echo, previous ICD with explantation 2/2 infection, atrial fibrillation on warfarin, dyslipidemia, hypertension, GERD who presented to [facility] with several days of diarrhea. Differential for diarrhea is infectious vs. Medications vs. Functional vs other. Differential for bradycardia is likely multifactorial in setting of diarrhea, poor PO intake, acute renal failure, chronic amiodarone use with potential increased dosage, chronic atrial fibrillation with possible contribution from progressive conduction disease among other etiologies. For now, watch closely on telemetry and allow slow washout of any acute effect. Amiodarone with significant > 6 week half-life.  Problem List Diarrhea Acute renal failure  Junctional Bradycardia  Chronic Systolic Heart failure  Supratherapeutic INR Anemia   Plan 1. Hold amiodarone and warfarin 2. Watch on telemetry while "washing out" medications 3. Defer INR reversal to primary team - FFP not unreasonable, may need low dose vitamin K to assist (2.5-5.0 mg x1) 4. Treat/evaluate underlying diarrhea and renal failure  5. If becomes symptomatic or HR drops significantly then would transfer to stepdown with pads   Sumayah Bearse 02/14/2015, 11:09 PM

## 2015-02-14 NOTE — H&P (Signed)
History and Physical  [name] ZOX:[id number] DOB: [birth year] DOA: 02/14/2015  Referring physician: Dr Jeraldine Loots, ED physician PCP: Ezequiel Kayser, MD   Chief Complaint: Diarrhea  HPI: [name] is a 80 y.o. male  With a history of coronary artery disease, status post CABG 5 vessels, ischemic cardiomyopathy with systolic heart failure with an EF of 20-25% on echocardiogram done 02/28/2013. Additionally, he has atrial fibrillation is controlled on amiodarone and is anticoagulated with warfarin. Calcium as hypertension, GERD, hyperlipidemia. He presents to the emergency department with 3-4 days of worsening diarrhea that consists mainly of loose and watery stools. Additionally, he complains of significantly decreased appetite and has not eaten for 2-3 days. He has continued to take his amiodarone and warfarin throughout this illness. He has tried some over-the-counter antidiarrheals which has not been helpful. He denies recent antibiotic use. No provoking factors.   Review of Systems:   Pt complains of dizziness and lightheadedness.  Pt denies any fevers, chills, nausea, vomiting, abdominal pain, chest pain, shortness of breath, palpitations, headache, blurred vision, rectal bleeding, melena, blood in stools.  Review of systems are otherwise negative  Past Medical History  Diagnosis Date  . Chest discomfort   . Low back pain   . Ischemic cardiomyopathy     EF 23% => improved to 40-45%;  Echo 5/14:  Mild LVH, EF 20-25%, mid to distal anteroseptal, anterior, inferior and apical AK, mild AI, mean AV gradient 4, moderate MR(Limited interrogation), moderate LAE, mild reduced RVSF, mild to moderate TR, peak RV-RA gradient 40  . Hearing loss   . SOB (shortness of breath)   . Joint pain   . VT (ventricular tachycardia)     s/p ICD explanted for infection.  The decision has been not to reimplant this given these difficulties.  . Anxiety   . Depression   . PAF (paroxysmal atrial  fibrillation)   . Hyperlipidemia   . Gynecomastia   . BPH (benign prostatic hypertrophy)   . Diabetes mellitus   . Hypertension   . Degenerative joint disease   . GERD (gastroesophageal reflux disease)   . CAD (coronary artery disease)   . Pneumonia    Past Surgical History  Procedure Laterality Date  . Cardiac defibrillator removal    . Coronary artery bypass graft  2000    X5  . Cardiac catheterization  12/25/2008    THE LEFT VENTRICLE WAS ENLARGED. THERE IS ANTERIOR APICAL AND INFERIOR APICAL AKINESIA. EF ESTIMATED 20%. NO MITRAL REGURGITATION.  . Cardiac catheterization  12/2008    REPEAT CATH, SHOWED BYPASS GRAFTS WERE PATENT  . US echocardiography  2007    EF 20 to 25%  . Ablation of dysrhythmic focus    . Pars plana vitrectomy  09/24/2011    Procedure: PARS PLANA VITRECTOMY WITH 25 GAUGE;  Surgeon: Alford Highland Rankin;  Location: [facility] OR;  Service: Ophthalmology;  Laterality: Left;  MEMBRANE PEEL, SILICONE OIL  . Cardioversion N/A 11/20/2012    Procedure: CARDIOVERSION;  Surgeon: Pricilla Riffle, MD;  Location: [facility] ENDOSCOPY;  Service: Cardiovascular;  Laterality: N/A;  . Cardioversion N/A 12/28/2012    Procedure: CARDIOVERSION;  Surgeon: Cassell Clement, MD;  Location: [facility] ENDOSCOPY;  Service: Cardiovascular;  Laterality: N/A;  . Cataract extraction, bilateral     Social History:  reports that he has never smoked. He has never used smokeless tobacco. He reports that he does not drink alcohol or use illicit drugs. Patient lives at home & is able to participate in  activities of daily living  No Known Allergies  No family history on file. patient is unaware of any family history other than a general family history of heart disease  Prior to Admission medications   Medication Sig Start Date End Date Taking? Authorizing Provider  ALPRAZolam Prudy Feeler) 0.5 MG tablet Take 0.5 mg by mouth 3 (three) times daily as needed. For anxiety.    Historical Provider, MD  amiodarone (PACERONE) 200 MG  tablet Take 1/2 tablet (100 mg total) by mouth 5 days a week. 05/03/14   Duke Salvia, MD  escitalopram (LEXAPRO) 10 MG tablet Take 10 mg by mouth daily.      Historical Provider, MD  finasteride (PROSCAR) 5 MG tablet Take 5 mg by mouth daily.      Historical Provider, MD  levothyroxine (SYNTHROID, LEVOTHROID) 50 MCG tablet TAKE 1/2 TABLET BY MOUTH EVERY DAY 06/18/13   Rosalio Macadamia, NP  losartan (COZAAR) 50 MG tablet Take 0.5 tablets (25 mg total) by mouth daily. 08/14/14   Duke Salvia, MD  pravastatin (PRAVACHOL) 20 MG tablet Take 20 mg by mouth daily.  07/28/13   Historical Provider, MD  sitaGLIPtin (JANUVIA) 50 MG tablet Take 25 mg by mouth as directed. 25 mg twice a week    Historical Provider, MD  warfarin (COUMADIN) 5 MG tablet Take 1/2 tablet daily or as directed 07/01/14   Duke Salvia, MD    Physical Exam: BP 158/60 mmHg  Pulse 26  Temp(Src) 98 F (36.7 C) (Oral)  Resp 22  SpO2 92%  General: Elderly Caucasian male. Awake and alert and oriented x3. No acute cardiopulmonary distress.  Eyes: Pupils equal, round, reactive to light. Extraocular muscles are intact. Sclerae anicteric and noninjected.  ENT:  Dry mucosal membranes. No mucosal lesions.   Neck: Neck supple without lymphadenopathy. No carotid bruits. No masses palpated.  Cardiovascular: Bradycardic rate with normal S1-S2 sounds. No murmurs, rubs, gallops auscultated. No JVD.  Respiratory: Good respiratory effort with no wheezes, rales, rhonchi. Lungs clear to auscultation bilaterally.  Abdomen: Soft, mild generalized tenderness, nondistended. Hyperactive bowel sounds. No masses or hepatosplenomegaly  Skin: Dry, warm to touch. 2+ dorsalis pedis and radial pulses. Musculoskeletal: No calf or leg pain. All major joints not erythematous nontender.  Psychiatric: Intact judgment and insight.  Neurologic: No focal neurological deficits. Cranial nerves II through XII are grossly intact.           Labs on Admission:    Basic Metabolic Panel:  Recent Labs Lab 02/14/15 1744 02/14/15 1803  NA 137 138  K 4.0 3.9  CL 102 101  CO2 23  --   GLUCOSE 157* 155*  BUN 49* 42*  CREATININE 1.78* 1.70*  CALCIUM 8.5*  --    Liver Function Tests:  Recent Labs Lab 02/14/15 1744  AST 80*  ALT 107*  ALKPHOS 60  BILITOT 2.2*  PROT 6.2*  ALBUMIN 3.5    Recent Labs Lab 02/14/15 1744  LIPASE 24   No results for input(s): AMMONIA in the last 168 hours. CBC:  Recent Labs Lab 02/14/15 1744 02/14/15 1803  WBC 9.3  --   HGB 10.7* 11.6*  HCT 33.8* 34.0*  MCV 94.2  --   PLT 279  --    Cardiac Enzymes: No results for input(s): CKTOTAL, CKMB, CKMBINDEX, TROPONINI in the last 168 hours.  BNP (last 3 results) No results for input(s): BNP in the last 8760 hours.  ProBNP (last 3 results) No results for input(s): PROBNP  in the last 8760 hours.  CBG: No results for input(s): GLUCAP in the last 168 hours.  Radiological Exams on Admission: No results found.  EKG: Independently reviewed. Junctional rhythm with no ST changes.  Assessment/Plan Present on Admission:  . Bradycardia with 31 - 40 beats per minute . Elevated INR (international normalized ratio) due to prior anticoagulant medication ingestion . Acute renal injury . Diarrhea . Dehydration  This patient was discussed with the ED physician, including pertinent vitals, physical exam findings, labs, and imaging.  We also discussed care given by the ED provider.  #1 bradycardia #2 elevated INR #3 acute renal injury #4 diarrhea #5 atrial fibrillation on amiodarone #6 chronic anticoagulation #7 hypertension #8 dehydration #9 chronic systolic heart failure  Admit to [facility] for cardiology consult due to symptomatically bradycardia. Will of the patient to telemetry. The patient received FFP and 5 mg of vitamin K subcutaneous for his elevated INR. Repeat INR in the morning Hold Coumadin Fluid hydrate the patient for his dehydration  and to help resolve his acute renal injury. We'll be cautious due to his chronic heart failure. Check creatinine in the morning Continue home medications C. difficile is still pending - low risk due to no recent antibiotic use If negative, start antidiarrheals.  If positive start antibiotics  DVT prophylaxis: SCDs.  Consultants: Cardiology to consult when patient [facility] at [facility]  Code Status: Full code  Family Communication: None   Disposition Plan: Home following resolution  Time spent: 70 minutes was spent with face-to-face time with patient with at least 50% with counseling and coordination of care  Levie Heritage, DO [facility] Pager [phone]

## 2015-02-14 NOTE — ED Provider Notes (Signed)
CSN: [id number]     Arrival date & time 02/14/15  1735 History   First MD Initiated Contact with Patient 02/14/15 1738     Chief Complaint  Patient presents with  . Diarrhea  . Bradycardia    HPI Patient presents with weakness, diarrhea. Diarrhea seems to have begun yesterday pain Patient notes that he was in his usual state of health prior to that, though he acknowledges multiple medical problems. With persistent diarrhea, he went to a different facility emergency department. Patient was evaluated there, discharged. He states that since discharge yesterday he has had worsening weakness, persistent diarrhea, lightheadedness with standing. EMS reports the patient was bradycardic, hypotensive, with profound amounts stool present on their initial evaluation. The patient denies confusion, disorientation, acknowledges the aforementioned concerns. Patient is here with his daughter who assists with the history of present illness. She denies hospitalization, recent antibiotics.  Past Medical History  Diagnosis Date  . Chest discomfort   . Low back pain   . Ischemic cardiomyopathy     EF 23% => improved to 40-45%;  Echo 5/14:  Mild LVH, EF 20-25%, mid to distal anteroseptal, anterior, inferior and apical AK, mild AI, mean AV gradient 4, moderate MR(Limited interrogation), moderate LAE, mild reduced RVSF, mild to moderate TR, peak RV-RA gradient 40  . Hearing loss   . SOB (shortness of breath)   . Joint pain   . VT (ventricular tachycardia)     s/p ICD explanted for infection.  The decision has been not to reimplant this given these difficulties.  . Anxiety   . Depression   . PAF (paroxysmal atrial fibrillation)   . Hyperlipidemia   . Gynecomastia   . BPH (benign prostatic hypertrophy)   . Diabetes mellitus   . Hypertension   . Degenerative joint disease   . GERD (gastroesophageal reflux disease)   . CAD (coronary artery disease)   . Pneumonia    Past Surgical History  Procedure  Laterality Date  . Cardiac defibrillator removal    . Coronary artery bypass graft  2000    X5  . Cardiac catheterization  12/25/2008    THE LEFT VENTRICLE WAS ENLARGED. THERE IS ANTERIOR APICAL AND INFERIOR APICAL AKINESIA. EF ESTIMATED 20%. NO MITRAL REGURGITATION.  . Cardiac catheterization  12/2008    REPEAT CATH, SHOWED BYPASS GRAFTS WERE PATENT  . US echocardiography  2007    EF 20 to 25%  . Ablation of dysrhythmic focus    . Pars plana vitrectomy  09/24/2011    Procedure: PARS PLANA VITRECTOMY WITH 25 GAUGE;  Surgeon: Alford Highland Rankin;  Location: [facility] OR;  Service: Ophthalmology;  Laterality: Left;  MEMBRANE PEEL, SILICONE OIL  . Cardioversion N/A 11/20/2012    Procedure: CARDIOVERSION;  Surgeon: Pricilla Riffle, MD;  Location: [facility] ENDOSCOPY;  Service: Cardiovascular;  Laterality: N/A;  . Cardioversion N/A 12/28/2012    Procedure: CARDIOVERSION;  Surgeon: Cassell Clement, MD;  Location: [facility] ENDOSCOPY;  Service: Cardiovascular;  Laterality: N/A;  . Cataract extraction, bilateral     No family history on file. History  Substance Use Topics  . Smoking status: Never Smoker   . Smokeless tobacco: Never Used  . Alcohol Use: No     Comment: hx of in younger years, not now    Review of Systems  Constitutional:       Per HPI, otherwise negative  HENT:       Per HPI, otherwise negative  Respiratory:       Per HPI,  otherwise negative  Cardiovascular:       Per HPI, otherwise negative  Gastrointestinal: Positive for diarrhea. Negative for vomiting.  Endocrine:       Negative aside from HPI  Genitourinary:       Neg aside from HPI   Musculoskeletal:       Per HPI, otherwise negative  Skin: Negative.   Neurological: Negative for syncope.      Allergies  Review of patient's allergies indicates no known allergies.  Home Medications   Prior to Admission medications   Medication Sig Start Date End Date Taking? Authorizing Provider  ALPRAZolam Prudy Feeler) 0.5 MG tablet Take 0.5 mg by  mouth 3 (three) times daily as needed. For anxiety.    Historical Provider, MD  amiodarone (PACERONE) 200 MG tablet Take 1/2 tablet (100 mg total) by mouth 5 days a week. 05/03/14   Duke Salvia, MD  escitalopram (LEXAPRO) 10 MG tablet Take 10 mg by mouth daily.      Historical Provider, MD  finasteride (PROSCAR) 5 MG tablet Take 5 mg by mouth daily.      Historical Provider, MD  levothyroxine (SYNTHROID, LEVOTHROID) 50 MCG tablet TAKE 1/2 TABLET BY MOUTH EVERY DAY 06/18/13   Rosalio Macadamia, NP  losartan (COZAAR) 50 MG tablet Take 0.5 tablets (25 mg total) by mouth daily. 08/14/14   Duke Salvia, MD  pravastatin (PRAVACHOL) 20 MG tablet Take 20 mg by mouth daily.  07/28/13   Historical Provider, MD  sitaGLIPtin (JANUVIA) 50 MG tablet Take 25 mg by mouth as directed. 25 mg twice a week    Historical Provider, MD  warfarin (COUMADIN) 5 MG tablet Take 1/2 tablet daily or as directed 07/01/14   Duke Salvia, MD   BP 123/61 mmHg  Pulse 37  Temp(Src) 98 F (36.7 C) (Oral)  Resp 18  SpO2 92% Physical Exam  Constitutional: He is oriented to person, place, and time. He appears well-developed. He appears ill. He appears distressed.  HENT:  Head: Normocephalic and atraumatic.  Eyes: Conjunctivae and EOM are normal.  Cardiovascular: Regular rhythm.  Bradycardia present.   Pulmonary/Chest: Effort normal. No stridor. No respiratory distress.  Abdominal: He exhibits no distension.  Musculoskeletal: He exhibits no edema.  Neurological: He is alert and oriented to person, place, and time.  Skin: Skin is warm and dry. There is pallor.  Stool covering the patient's legs  Psychiatric: He has a normal mood and affect.  Nursing note and vitals reviewed.   ED Course  Procedures (including critical care time) Labs Review Labs Reviewed  CBC - Abnormal; Notable for the following:    RBC 3.59 (*)    Hemoglobin 10.7 (*)    HCT 33.8 (*)    All other components within normal limits  COMPREHENSIVE  METABOLIC PANEL - Abnormal; Notable for the following:    Glucose, Bld 157 (*)    BUN 49 (*)    Creatinine, Ser 1.78 (*)    Calcium 8.5 (*)    Total Protein 6.2 (*)    AST 80 (*)    ALT 107 (*)    Total Bilirubin 2.2 (*)    GFR calc non Af Amer 35 (*)    GFR calc Af Amer 41 (*)    All other components within normal limits  PROTIME-INR - Abnormal; Notable for the following:    Prothrombin Time >90.0 (*)    INR >10.00 (*)    All other components within normal limits  I-STAT  CG4 LACTIC ACID, ED - Abnormal; Notable for the following:    Lactic Acid, Venous 2.30 (*)    All other components within normal limits  CLOSTRIDIUM DIFFICILE BY PCR  LIPASE, BLOOD  I-STAT CHEM 8, ED  PREPARE FRESH FROZEN PLASMA   Notably, on initial evaluation the patient's family bradycardic, with a heart rate in the 30s. Rate appears regular, though this is abnormal.   Initial EKG with sinus bradycardia, interpreted, but appears junctional, with artifact, abnormal   Second EKG after new bolus of fluid shows atrial flutter, with 4/1 conduction, abnormal.   EKG Interpretation   Date/Time:  Friday Feb 14 2015 17:36:46 EDT Ventricular Rate:  31 PR Interval:  148 QRS Duration: 119 QT Interval:  678 QTC Calculation: 487 R Axis:   -67 Text Interpretation:  Sinus bradycardia Ventricular preexcitation(WPW)  Junctional bradycardia T wave abnormality Abnormal ekg Confirmed by  Gerhard Munch  MD [id number]) on 02/14/2015 5:56:22 PM     On monitor the patient has slight improvement, though he continues to have diarrhea. Patient's initial labs notable for INR of 10. Hemoccult card was negative, but given concern for critical illness, patient vitamin K, fresh frozen plasma.  She has evidence for acute kidney injury, lactic acidosis.  MDM  Patient presents with concern of ongoing weakness, diarrhea. Notably, the patient has a history of arrhythmia, and has continued to take amiodarone, Coumadin throughout  this gastrointestinal illness. Patient is normotensive, but has a bradycardia, likely due to his continued use of amiodarone, other medication. Patient also has supratherapeutic INR, greater than 10. No evidence for ongoing bleed, but with concern given his overall condition, patient of first of this with fresh frozen plasma, vitamin K. Given the patient's illness, he required admission to the step-down unit for further evaluation and management.  CRITICAL CARE Performed by: Gerhard Munch Total critical care time: 40 Critical care time was exclusive of separately billable procedures and treating other patients. Critical care was necessary to treat or prevent imminent or life-threatening deterioration. Critical care was time spent personally by me on the following activities: development of treatment plan with patient and/or surrogate as well as nursing, discussions with consultants, evaluation of patient's response to treatment, examination of patient, obtaining history from patient or surrogate, ordering and performing treatments and interventions, ordering and review of laboratory studies, ordering and review of radiographic studies, pulse oximetry and re-evaluation of patient's condition.     Gerhard Munch, MD 02/14/15 2146

## 2015-02-14 NOTE — ED Notes (Signed)
Pt comes in by EMS for constant diarrhea. Pt was seen at [facility] yesterday and was d/c. States this has been going on 2 days. Per EMS pt has a drop in BP when standing. Pt has HR 30's with A-Fib. Pt was placed in trendelenburg and states he feels much better. Other than HR, pt has VS WDL. CBG - 157. Pt is alert and oriented and responding to staff.

## 2015-02-14 NOTE — Progress Notes (Signed)
Cardiology on call and [facility] admissions made aware of pt's arrival on the floor.

## 2015-02-14 NOTE — ED Notes (Signed)
MD at bedside. 

## 2015-02-14 NOTE — ED Notes (Signed)
Pt placed on a bed pan to try and obtain a stool sample.

## 2015-02-15 DIAGNOSIS — N189 Chronic kidney disease, unspecified: Secondary | ICD-10-CM

## 2015-02-15 DIAGNOSIS — R7889 Finding of other specified substances, not normally found in blood: Secondary | ICD-10-CM

## 2015-02-15 DIAGNOSIS — I4891 Unspecified atrial fibrillation: Secondary | ICD-10-CM

## 2015-02-15 DIAGNOSIS — I5022 Chronic systolic (congestive) heart failure: Secondary | ICD-10-CM | POA: Diagnosis present

## 2015-02-15 DIAGNOSIS — N183 Chronic kidney disease, stage 3 unspecified: Secondary | ICD-10-CM | POA: Diagnosis present

## 2015-02-15 DIAGNOSIS — R197 Diarrhea, unspecified: Secondary | ICD-10-CM

## 2015-02-15 DIAGNOSIS — E1129 Type 2 diabetes mellitus with other diabetic kidney complication: Secondary | ICD-10-CM | POA: Diagnosis present

## 2015-02-15 DIAGNOSIS — R001 Bradycardia, unspecified: Principal | ICD-10-CM

## 2015-02-15 DIAGNOSIS — E1122 Type 2 diabetes mellitus with diabetic chronic kidney disease: Secondary | ICD-10-CM

## 2015-02-15 LAB — GLUCOSE, CAPILLARY
GLUCOSE-CAPILLARY: 139 mg/dL — AB (ref 65–99)
Glucose-Capillary: 149 mg/dL — ABNORMAL HIGH (ref 65–99)
Glucose-Capillary: 173 mg/dL — ABNORMAL HIGH (ref 65–99)
Glucose-Capillary: 142 mg/dL — ABNORMAL HIGH (ref 65–99)

## 2015-02-15 LAB — BASIC METABOLIC PANEL
Anion gap: 12 (ref 5–15)
BUN: 46 mg/dL — ABNORMAL HIGH (ref 6–20)
CO2: 22 mmol/L (ref 22–32)
CREATININE: 1.7 mg/dL — AB (ref 0.61–1.24)
Calcium: 8.6 mg/dL — ABNORMAL LOW (ref 8.9–10.3)
Chloride: 104 mmol/L (ref 101–111)
GFR calc non Af Amer: 37 mL/min — ABNORMAL LOW (ref >60)
GFR, EST AFRICAN AMERICAN: 43 mL/min — AB (ref >60)
Glucose, Bld: 163 mg/dL — ABNORMAL HIGH (ref 65–99)
Potassium: 4.4 mmol/L (ref 3.5–5.1)
Sodium: 138 mmol/L (ref 135–145)

## 2015-02-15 LAB — PREPARE FRESH FROZEN PLASMA: UNIT DIVISION: 0

## 2015-02-15 LAB — URINALYSIS, ROUTINE W REFLEX MICROSCOPIC
GLUCOSE, UA: NEGATIVE mg/dL
HGB URINE DIPSTICK: NEGATIVE
KETONES UR: 15 mg/dL — AB
Nitrite: POSITIVE — AB
Protein, ur: NEGATIVE mg/dL
Specific Gravity, Urine: 1.03 (ref 1.005–1.030)
UROBILINOGEN UA: 1 mg/dL (ref 0.0–1.0)
pH: 5 (ref 5.0–8.0)

## 2015-02-15 LAB — URINE MICROSCOPIC-ADD ON

## 2015-02-15 LAB — PROTIME-INR
INR: 4.87 — ABNORMAL HIGH (ref 0.00–1.49)
PROTHROMBIN TIME: 45.8 s — AB (ref 11.6–15.2)

## 2015-02-15 LAB — MRSA PCR SCREENING: MRSA BY PCR: NEGATIVE

## 2015-02-15 MED ORDER — INSULIN ASPART 100 UNIT/ML ~~LOC~~ SOLN: 0.0000 [IU] | Freq: Every day | SUBCUTANEOUS | Status: DC

## 2015-02-15 MED ORDER — INSULIN ASPART 100 UNIT/ML ~~LOC~~ SOLN
0.0000 [IU] | Freq: Three times a day (TID) | SUBCUTANEOUS | Status: DC
  Administered 2015-02-15 – 2015-02-16 (×2): 2 [IU] via SUBCUTANEOUS
  Administered 2015-02-17 – 2015-02-18 (×4): 1 [IU] via SUBCUTANEOUS
  Administered 2015-02-18: 2 [IU] via SUBCUTANEOUS
  Administered 2015-02-19: 1 [IU] via SUBCUTANEOUS

## 2015-02-15 NOTE — Progress Notes (Signed)
Patient Name: [name] Date of Encounter: 02/15/2015     Principal Problem:   Bradycardia with 31 - 40 beats per minute Active Problems:   Atrial fibrillation   Elevated INR (international normalized ratio) due to prior anticoagulant medication ingestion   Acute renal injury   Diarrhea   Dehydration   Chronic systolic heart failure   DM (diabetes mellitus), type 2 with renal complications   CKD (chronic kidney disease), stage III    SUBJECTIVE  The patient is lethargic but he will awaken when spoken to.  His speech is somewhat difficult to understand.  He remains in atrial fibrillation with very slow ventricular response in the 30s.  CURRENT MEDS . sodium chloride  10 mL/hr Intravenous Once  . escitalopram  10 mg Oral Daily  . finasteride  5 mg Oral Daily  . insulin aspart  0-5 Units Subcutaneous QHS  . insulin aspart  0-9 Units Subcutaneous TID WC  . levothyroxine  25 mcg Oral QAC breakfast  . losartan  25 mg Oral Daily  . pravastatin  20 mg Oral q1800  . sodium chloride  3 mL Intravenous Q12H    OBJECTIVE  Filed Vitals:   02/15/15 0107 02/15/15 0121 02/15/15 0221 02/15/15 0509  BP:  141/56 154/45 112/75  Pulse: 34 35 36 36  Temp:  98.2 F (36.8 C)    TempSrc:  Oral  Oral  Resp: Height:      Weight:      SpO2: 99% 97% 100% 98%    Intake/Output Summary (Last 24 hours) at 02/15/15 1343 Last data filed at 02/15/15 0600  Gross per 24 hour  Intake   1217 ml  Output      0 ml  Net   1217 ml   Filed Weights   02/14/15 2320  Weight: 199 lb 15.8 oz (90.714 kg)    PHYSICAL EXAM   HEENT:  Normal  Neck: Supple without bruits or JVD. Lungs:  Resp regular and unlabored, CTA. Heart: Very slow and irregular.  Heart rate in the 30s.  no s3, s4, there is a grade 1/6 systolic ejection murmur at the left sternal edge. Abdomen: Soft, non-tender, non-distended, BS + x 4.  Extremities: No clubbing, cyanosis or edema. DP/PT/Radials 2+ and equal  bilaterally.  Accessory Clinical Findings  CBC  Recent Labs  02/14/15 1744 02/14/15 1803  WBC 9.3  --   HGB 10.7* 11.6*  HCT 33.8* 34.0*  MCV 94.2  --   PLT 279  --    Basic Metabolic Panel  Recent Labs  02/14/15 1744 02/14/15 1803 02/14/15 2004 02/15/15 0306  NA 137 138  --  138  K 4.0 3.9  --  4.4  CL 102 101  --  104  CO2 23  --   --  22  GLUCOSE 157* 155*  --  163*  BUN 49* 42*  --  46*  CREATININE 1.78* 1.70*  --  1.70*  CALCIUM 8.5*  --   --  8.6*  MG  --   --  3.2*  --    Liver Function Tests  Recent Labs  02/14/15 1744  AST 80*  ALT 107*  ALKPHOS 60  BILITOT 2.2*  PROT 6.2*  ALBUMIN 3.5    Recent Labs  02/14/15 1744  LIPASE 24   Cardiac Enzymes No results for input(s): CKTOTAL, CKMB, CKMBINDEX, TROPONINI in the last 72 hours. BNP Invalid input(s): POCBNP D-Dimer No results for  input(s): DDIMER in the last 72 hours. Hemoglobin A1C No results for input(s): HGBA1C in the last 72 hours. Fasting Lipid Panel No results for input(s): CHOL, HDL, LDLCALC, TRIG, CHOLHDL, LDLDIRECT in the last 72 hours. Thyroid Function Tests No results for input(s): TSH, T4TOTAL, T3FREE, THYROIDAB in the last 72 hours.  Invalid input(s): FREET3  TELE atrial fibrillation with slow ventricular response  ECG atrial fibrillation with slow ventricular response.  Old inferior and lateral wall myocardial infarction  Radiology/Studies  No results found.  ASSESSMENT AND PLAN 1.  Ischemic cardiomyopathy with last known ejection fraction 20-25% 2.  Previous ICD with explantation secondary to infection 3.  Chronic atrial fibrillation on warfarin.  He is supratherapeutic on admission. 4.  Diarrhea 5.  Acute renal failure  Plan: Continue to hold amiodarone and warfarin.  Watch on telemetry.  No indication for pacemaker at this point.   Signed, Cassell Clement MD

## 2015-02-15 NOTE — Progress Notes (Signed)
Utilization review completed.  

## 2015-02-15 NOTE — Progress Notes (Signed)
[facility] hospital[facility] progress note. Chief complaint. Transfer note. History of present illness. This 80 year old male presented to [facility] emergency room with complaints of diarrhea. Patient was also noted to have symptomatic bradycardia. Excuse was discussed with cardiology and they requested patient be sent to [facility] for further cardiology evaluation of the patient's bradycardia. The patient is now arrived in transfer and I'm seeing the patient at bedside to ensure he remains clinically stable and then his orders transferred appropriately. The patient has no current medical complaints. Physical exam. Vital signs. Temperature 98.6, pulse 36, respiration 12, blood pressure 157/49. O2 sats 98%. General appearance. Well-developed elderly male who is alert and in no distress. Cardiac. Regular and bradycardic. Lungs. Breath sounds clear. Abdomen. Soft with positive bowel sounds. Impression/plan. Problem #1. Patient transferred to [facility] was symptomatically bradycardia. He is being monitored on telemetry. Cardiology will consult and I will follow for recommendations. Problem #2. Diarrhea. Patient's C. difficile negative. We'll initiate Imodium. Problem #3. Elevated INR. Coumadin held and patient status post FFP and vitamin K. Problem #4. Dehydration and acute renal injury. Fluid hydration and repeat lab work in the morning. Problem #5. Atrial fib. Continue home amiodarone. Problem #6. Hypertension. Continue home medications. Patient appears clinically stable post transfer. All orders transferred appropriately.

## 2015-02-15 NOTE — Progress Notes (Signed)
[facility] PROGRESS NOTE  [name] PRF:[id number] DOB: [birth year] DOA: 02/14/2015 PCP: [name] Kayser, MD  Assessment/Plan:  Principal Problem:   Bradycardia with 31 - 40 beats per minute Active Problems:   Atrial fibrillation   Elevated INR (international normalized ratio) due to prior anticoagulant medication ingestion   Acute renal injury   Diarrhea   Dehydration   Chronic systolic heart failure   DM (diabetes mellitus), type 2 with renal complications   CKD (chronic kidney disease), stage III  Still bradycardic. Cardiology following. Amiodarone and warfarin held. INR today is 4. C. difficile negative. Still having loose stool. Will check GI pathogen panel. Decrease IV to [facility]. Stop trajenta and place on sliding scale insulin. Physical therapy evaluation. Advance diet to solid.  Code Status:  full Family Communication:  Left message with daughter by phone. Disposition Plan:     HPI/Subjective: Denies chest pain or shortness of breath. No vomiting. Has not eaten much today. Per nurse, having loose stool and flexiseal placed last night.   Objective: Filed Vitals:   02/15/15 0509  BP: 112/75  Pulse: 36  Temp:   Resp:     Intake/Output Summary (Last 24 hours) at 02/15/15 1251 Last data filed at 02/15/15 0600  Gross per 24 hour  Intake   1217 ml  Output      0 ml  Net   1217 ml   Filed Weights   02/14/15 2320  Weight: 90.714 kg (199 lb 15.8 oz)   Telemetry: atrial fibrillation, rate in the 30s  Exam:   General:  Asleep. Arousable. Appears weak.  HEENT: Dry mucous membranes  Cardiovascular: Slow. Irregularly irregular no murmurs gallops rubs appreciated  Respiratory: Clear to auscultation bilaterally without wheezes rhonchi or rales  Abdomen: Soft, normal bowel sounds, nondistended. Minimal lower abdominal tenderness  Rectal: flexiseal with loose brown stool  Ext: No clubbing cyanosis or edema   Basic Metabolic Panel:  Recent Labs Lab  02/14/15 1744 02/14/15 1803 02/14/15 2004 02/15/15 0306  NA 137 138  --  138  K 4.0 3.9  --  4.4  CL 102 101  --  104  CO2 23  --   --  22  GLUCOSE 157* 155*  --  163*  BUN 49* 42*  --  46*  CREATININE 1.78* 1.70*  --  1.70*  CALCIUM 8.5*  --   --  8.6*  MG  --   --  3.2*  --    Liver Function Tests:  Recent Labs Lab 02/14/15 1744  AST 80*  ALT 107*  ALKPHOS 60  BILITOT 2.2*  PROT 6.2*  ALBUMIN 3.5    Recent Labs Lab 02/14/15 1744  LIPASE 24   No results for input(s): AMMONIA in the last 168 hours. CBC:  Recent Labs Lab 02/14/15 1744 02/14/15 1803  WBC 9.3  --   HGB 10.7* 11.6*  HCT 33.8* 34.0*  MCV 94.2  --   PLT 279  --    Cardiac Enzymes: No results for input(s): CKTOTAL, CKMB, CKMBINDEX, TROPONINI in the last 168 hours. BNP (last 3 results) No results for input(s): BNP in the last 8760 hours.  ProBNP (last 3 results) No results for input(s): PROBNP in the last 8760 hours.  CBG:  Recent Labs Lab 02/15/15 0912 02/15/15 1243  GLUCAP 149* 139*    Recent Results (from the past 240 hour(s))  Clostridium Difficile by PCR     Status: None   Collection Time: 02/14/15  8:15 PM  Result Value Ref Range Status   C difficile by pcr NEGATIVE NEGATIVE Final  MRSA PCR Screening     Status: None   Collection Time: 02/14/15 11:10 PM  Result Value Ref Range Status   MRSA by PCR NEGATIVE NEGATIVE Final    Comment:        The GeneXpert MRSA Assay (FDA approved for NASAL specimens only), is one component of a comprehensive MRSA colonization surveillance program. It is not intended to diagnose MRSA infection nor to guide or monitor treatment for MRSA infections.      Studies: No results found.  Scheduled Meds: . sodium chloride  10 mL/hr Intravenous Once  . escitalopram  10 mg Oral Daily  . finasteride  5 mg Oral Daily  . insulin aspart  0-5 Units Subcutaneous QHS  . insulin aspart  0-9 Units Subcutaneous TID WC  . levothyroxine  25 mcg Oral  QAC breakfast  . losartan  25 mg Oral Daily  . pravastatin  20 mg Oral q1800  . sodium chloride  3 mL Intravenous Q12H   Continuous Infusions: . sodium chloride 75 mL/hr at 02/15/15 0044    Time spent: 25 minutes  [name]  [facility] Pager [phone] [email or web address], password [facility] 02/15/2015, 12:51 PM  LOS: 1 day

## 2015-02-16 ENCOUNTER — Inpatient Hospital Stay (HOSPITAL_COMMUNITY): Payer: Medicare Other

## 2015-02-16 DIAGNOSIS — I5022 Chronic systolic (congestive) heart failure: Secondary | ICD-10-CM

## 2015-02-16 DIAGNOSIS — R8290 Unspecified abnormal findings in urine: Secondary | ICD-10-CM

## 2015-02-16 DIAGNOSIS — E039 Hypothyroidism, unspecified: Secondary | ICD-10-CM | POA: Diagnosis present

## 2015-02-16 DIAGNOSIS — R0600 Dyspnea, unspecified: Secondary | ICD-10-CM | POA: Clinically undetermined

## 2015-02-16 LAB — BASIC METABOLIC PANEL
ANION GAP: 12 (ref 5–15)
BUN: 47 mg/dL — AB (ref 6–20)
CALCIUM: 8.5 mg/dL — AB (ref 8.9–10.3)
CO2: 22 mmol/L (ref 22–32)
CREATININE: 1.61 mg/dL — AB (ref 0.61–1.24)
Chloride: 103 mmol/L (ref 101–111)
GFR calc Af Amer: 46 mL/min — ABNORMAL LOW (ref >60)
GFR calc non Af Amer: 40 mL/min — ABNORMAL LOW (ref >60)
Glucose, Bld: 125 mg/dL — ABNORMAL HIGH (ref 65–99)
Potassium: 4.2 mmol/L (ref 3.5–5.1)
SODIUM: 137 mmol/L (ref 135–145)

## 2015-02-16 LAB — GLUCOSE, CAPILLARY
GLUCOSE-CAPILLARY: 145 mg/dL — AB (ref 65–99)
Glucose-Capillary: 113 mg/dL — ABNORMAL HIGH (ref 65–99)
Glucose-Capillary: 141 mg/dL — ABNORMAL HIGH (ref 65–99)
Glucose-Capillary: 157 mg/dL — ABNORMAL HIGH (ref 65–99)

## 2015-02-16 LAB — TSH: TSH: 0.047 u[IU]/mL — AB (ref 0.350–4.500)

## 2015-02-16 LAB — T4, FREE: Free T4: 3.33 ng/dL — ABNORMAL HIGH (ref 0.61–1.12)

## 2015-02-16 LAB — PROTIME-INR
INR: 2.62 — ABNORMAL HIGH (ref 0.00–1.49)
PROTHROMBIN TIME: 28.3 s — AB (ref 11.6–15.2)

## 2015-02-16 MED ORDER — FUROSEMIDE 10 MG/ML IJ SOLN
40.0000 mg | Freq: Once | INTRAMUSCULAR | Status: AC
  Administered 2015-02-16: 40 mg via INTRAVENOUS
  Filled 2015-02-16: qty 4

## 2015-02-16 MED ORDER — WARFARIN - PHARMACIST DOSING INPATIENT
Freq: Every day | Status: DC
  Administered 2015-02-17: 19:00:00

## 2015-02-16 MED ORDER — WARFARIN SODIUM 5 MG PO TABS
5.0000 mg | ORAL_TABLET | Freq: Once | ORAL | Status: AC
  Administered 2015-02-16: 5 mg via ORAL
  Filled 2015-02-16: qty 1

## 2015-02-16 MED ORDER — ESCITALOPRAM OXALATE 10 MG PO TABS
20.0000 mg | ORAL_TABLET | Freq: Every day | ORAL | Status: DC
  Administered 2015-02-17 – 2015-02-19 (×3): 20 mg via ORAL
  Filled 2015-02-16 (×3): qty 2

## 2015-02-16 NOTE — Progress Notes (Signed)
Patient Name: [name] Date of Encounter: 02/16/2015     Principal Problem:   Bradycardia with 31 - 40 beats per minute Active Problems:   Atrial fibrillation   Elevated INR (international normalized ratio) due to prior anticoagulant medication ingestion   Acute renal injury   Diarrhea   Dehydration   Chronic systolic heart failure   DM (diabetes mellitus), type 2 with renal complications   CKD (chronic kidney disease), stage III    SUBJECTIVE  The patient denies any chest pain or shortness of breath.  He complains of nausea.  However he was able to breakfast of sausage and eggs this morning. Rhythm remains atrial fibrillation with slow ventricular response.  Amiodarone has been discontinued at time of admission.  CURRENT MEDS . sodium chloride  10 mL/hr Intravenous Once  . escitalopram  10 mg Oral Daily  . finasteride  5 mg Oral Daily  . insulin aspart  0-5 Units Subcutaneous QHS  . insulin aspart  0-9 Units Subcutaneous TID WC  . levothyroxine  25 mcg Oral QAC breakfast  . losartan  25 mg Oral Daily  . pravastatin  20 mg Oral q1800  . sodium chloride  3 mL Intravenous Q12H    OBJECTIVE  Filed Vitals:   02/15/15 0221 02/15/15 0509 02/15/15 2122 02/16/15 0500  BP: 154/45 112/75 143/43 155/50  Pulse: 36 36 35 34  Temp:   97.7 F (36.5 C) 98 F (36.7 C)  TempSrc:  Oral Axillary Axillary  Resp: 6  25 18   Height:      Weight:    203 lb 4.8 oz (92.216 kg)  SpO2: 100% 98% 100% 99%    Intake/Output Summary (Last 24 hours) at 02/16/15 1001 Last data filed at 02/15/15 2230  Gross per 24 hour  Intake      0 ml  Output      0 ml  Net      0 ml   Filed Weights   02/14/15 2320 02/16/15 0500  Weight: 199 lb 15.8 oz (90.714 kg) 203 lb 4.8 oz (92.216 kg)    PHYSICAL EXAM  General: Pleasant, NAD. Neuro: Alert and oriented X 3. Moves all extremities spontaneously. Psych: Normal affect. HEENT:  Normal  Neck: Supple without bruits or JVD. Lungs:  Resp  regular and unlabored, CTA. Heart: Slow and irregular.  Soft systolic murmur at base. Abdomen: Soft, non-tender, non-distended, BS + x 4.  Extremities: No clubbing, cyanosis .  There is trace ankle edema left greater than right.Marland Kitchen DP/PT/Radials 2+ and equal bilaterally.  Accessory Clinical Findings  CBC  Recent Labs  02/14/15 1744 02/14/15 1803  WBC 9.3  --   HGB 10.7* 11.6*  HCT 33.8* 34.0*  MCV 94.2  --   PLT 279  --    Basic Metabolic Panel  Recent Labs  02/14/15 2004 02/15/15 0306 02/16/15 0404  NA  --  138 137  K  --  4.4 4.2  CL  --  104 103  CO2  --  22 22  GLUCOSE  --  163* 125*  BUN  --  46* 47*  CREATININE  --  1.70* 1.61*  CALCIUM  --  8.6* 8.5*  MG 3.2*  --   --    Liver Function Tests  Recent Labs  02/14/15 1744  AST 80*  ALT 107*  ALKPHOS 60  BILITOT 2.2*  PROT 6.2*  ALBUMIN 3.5    Recent Labs  02/14/15 1744  LIPASE 24  Cardiac Enzymes No results for input(s): CKTOTAL, CKMB, CKMBINDEX, TROPONINI in the last 72 hours. BNP Invalid input(s): POCBNP D-Dimer No results for input(s): DDIMER in the last 72 hours. Hemoglobin A1C No results for input(s): HGBA1C in the last 72 hours. Fasting Lipid Panel No results for input(s): CHOL, HDL, LDLCALC, TRIG, CHOLHDL, LDLDIRECT in the last 72 hours. Thyroid Function Tests  Recent Labs  02/16/15 0406  TSH 0.047*    TELE  Atrial fibrillation with slow ventricular response  ECG    Radiology/Studies  No results found.  ASSESSMENT AND PLAN  1. Ischemic cardiomyopathy with last known ejection fraction 20-25%.  Remote CABG. 2. Previous ICD with explantation secondary to infection 3. Chronic atrial fibrillation on warfarin. He is supratherapeutic on admission. 4. Diarrhea, improved 5. Acute renal failure.  Creatinine slightly improved today  Plan: Continue to hold amiodarone and warfarin. Continue to watch on telemetry. No indication for pacemaker at this  point.   Signed, Cassell Clement MD

## 2015-02-16 NOTE — Progress Notes (Signed)
40% Venti mask ordered and placed on pt. Will continue to monitor

## 2015-02-16 NOTE — Progress Notes (Signed)
[facility] PROGRESS NOTE  [name] ZOX:[id number] DOB: 01/13/1935 DOA: 02/14/2015 PCP: Ezequiel Kayser, MD  Assessment/Plan:  Principal Problem:   Bradycardia with 31 - 40 beats per minute continues. Cardiology following. Amiodarone held. Active Problems:   Dyspnea: Concerned about fluid overload. Will get a chest x-ray and give a dose of Lasix. IV fluids changed to [facility] yesterday and will stop today.   Atrial fibrillation   Elevated INR (international normalized ratio):  Now within therapeutic range after FFP and vitamin K. Pharmacy managing.   Acute renal injury   Nausea and Diarrhea:  C. difficile negative. GI pathogen panel pending.   Dehydration:  Hydrated on admission.   Chronic systolic heart failure   DM (diabetes mellitus), type 2 with renal complications:  Continue sliding scale insulin   CKD (chronic kidney disease), stage III   Hypothyroidism: On Synthroid as an outpatient. TSH low and free T4 high consistent with iatrogenic hyperthyroidism. Will stop Synthroid.   Abnormal urinalysis:  Has positive leukocyte esterase and nitrites, but minimal white cells on micro. Also grossly bloody urine so difficult to interpret. Hold off on antibiotics in the setting of continued nausea and diarrhea unless he develops symptoms of UTI or positive urine culture.  Code Status:  full Family Communication:  Discussed with daughter by phone on 5/14. Disposition Plan:     HPI/Subjective: Complains of nausea and dyspnea. Per nurse, breakfast.  Has not had any lunch. Has continued diarrhea per nursing staff  Objective: Filed Vitals:   02/16/15 0500  BP: 155/50  Pulse: 34  Temp: 98 F (36.7 C)  Resp: 18    Intake/Output Summary (Last 24 hours) at 02/16/15 1536 Last data filed at 02/16/15 0900  Gross per 24 hour  Intake    240 ml  Output      0 ml  Net    240 ml   Filed Weights   02/14/15 2320 02/16/15 0500  Weight: 90.714 kg (199 lb 15.8 oz) 92.216 kg (203 lb 4.8 oz)    Telemetry: atrial fibrillation, rate in the 30s  Exam:   General:  Asleep. Arousable. Appears weak.  HEENT: MMM  Cardiovascular: Slow. Irregularly irregular no murmurs gallops rubs appreciated  Respiratory: Clear to auscultation bilaterally without wheezes rhonchi or rales  Abdomen: Soft, normal bowel sounds, nondistended. Minimal lower abdominal tenderness  Rectal: flexiseal with loose brown stool  Ext: No clubbing cyanosis or edema   Basic Metabolic Panel:  Recent Labs Lab 02/14/15 1744 02/14/15 1803 02/14/15 2004 02/15/15 0306 02/16/15 0404  NA 137 138  --  138 137  K 4.0 3.9  --  4.4 4.2  CL 102 101  --  104 103  CO2 23  --   --  22 22  GLUCOSE 157* 155*  --  163* 125*  BUN 49* 42*  --  46* 47*  CREATININE 1.78* 1.70*  --  1.70* 1.61*  CALCIUM 8.5*  --   --  8.6* 8.5*  MG  --   --  3.2*  --   --    Liver Function Tests:  Recent Labs Lab 02/14/15 1744  AST 80*  ALT 107*  ALKPHOS 60  BILITOT 2.2*  PROT 6.2*  ALBUMIN 3.5    Recent Labs Lab 02/14/15 1744  LIPASE 24   No results for input(s): AMMONIA in the last 168 hours. CBC:  Recent Labs Lab 02/14/15 1744 02/14/15 1803  WBC 9.3  --   HGB 10.7* 11.6*  HCT 33.8* 34.0*  MCV 94.2  --   PLT 279  --    Cardiac Enzymes: No results for input(s): CKTOTAL, CKMB, CKMBINDEX, TROPONINI in the last 168 hours. BNP (last 3 results) No results for input(s): BNP in the last 8760 hours.  ProBNP (last 3 results) No results for input(s): PROBNP in the last 8760 hours.  CBG:  Recent Labs Lab 02/15/15 1243 02/15/15 1752 02/15/15 2108 02/16/15 0750 02/16/15 1133  GLUCAP 139* 173* 142* 113* 141*    Recent Results (from the past 240 hour(s))  Clostridium Difficile by PCR     Status: None   Collection Time: 02/14/15  8:15 PM  Result Value Ref Range Status   C difficile by pcr NEGATIVE NEGATIVE Final  MRSA PCR Screening     Status: None   Collection Time: 02/14/15 11:10 PM  Result Value Ref  Range Status   MRSA by PCR NEGATIVE NEGATIVE Final    Comment:        The GeneXpert MRSA Assay (FDA approved for NASAL specimens only), is one component of a comprehensive MRSA colonization surveillance program. It is not intended to diagnose MRSA infection nor to guide or monitor treatment for MRSA infections.      Studies: No results found.  Scheduled Meds: . [START ON 02/17/2015] escitalopram  20 mg Oral Daily  . finasteride  5 mg Oral Daily  . furosemide  40 mg Intravenous Once  . insulin aspart  0-5 Units Subcutaneous QHS  . insulin aspart  0-9 Units Subcutaneous TID WC  . losartan  25 mg Oral Daily  . pravastatin  20 mg Oral q1800  . sodium chloride  3 mL Intravenous Q12H  . warfarin  5 mg Oral ONCE-1800  . Warfarin - Pharmacist Dosing Inpatient   Does not apply q1800   Continuous Infusions:    Time spent: 25 minutes  Akirah Storck L  [facility] Pager [phone] [email or web address], password [facility] 02/16/2015, 3:36 PM  LOS: 2 days

## 2015-02-16 NOTE — Progress Notes (Signed)
Paged on call about UTI from recent urinalysis. 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 Was told it will be addressed to the attending for follow up orders

## 2015-02-16 NOTE — Progress Notes (Signed)
Spoke with Cards Fellow, received permission to decrease HR alarm to 28 BPM for known bradycardia. Will continue to monitor

## 2015-02-16 NOTE — Consult Note (Signed)
ANTICOAGULATION CONSULT NOTE - Initial Consult  Pharmacy Consult for Coumadin Indication: atrial fibrillation  No Known Allergies  Patient Measurements: Height: 5\' 9"  (175.3 cm) Weight: 203 lb 4.8 oz (92.216 kg) IBW/kg (Calculated) : 70.7  Vital Signs: Temp: 98 F (36.7 C) (05/15 0500) Temp Source: Axillary (05/15 0500) BP: 155/50 mmHg (05/15 0500) Pulse Rate: 34 (05/15 0500)  Labs:  Recent Labs  02/14/15 1744 02/14/15 1803 02/15/15 0306 02/16/15 0404 02/16/15 1353  HGB 10.7* 11.6*  --   --   --   HCT 33.8* 34.0*  --   --   --   PLT 279  --   --   --   --   LABPROT >90.0*  --  45.8*  --  28.3*  INR >10.00*  --  4.87*  --  2.62*  CREATININE 1.78* 1.70* 1.70* 1.61*  --     Estimated Creatinine Clearance: 43.1 mL/min (by C-G formula based on Cr of 1.61).   Medical History: Past Medical History  Diagnosis Date  . Chest discomfort   . Low back pain   . Ischemic cardiomyopathy     EF 23% => improved to 40-45%;  Echo 5/14:  Mild LVH, EF 20-25%, mid to distal anteroseptal, anterior, inferior and apical AK, mild AI, mean AV gradient 4, moderate MR(Limited interrogation), moderate LAE, mild reduced RVSF, mild to moderate TR, peak RV-RA gradient 40  . Hearing loss   . SOB (shortness of breath)   . Joint pain   . VT (ventricular tachycardia)     s/p ICD explanted for infection.  The decision has been not to reimplant this given these difficulties.  . Anxiety   . Depression   . PAF (paroxysmal atrial fibrillation)   . Hyperlipidemia   . Gynecomastia   . BPH (benign prostatic hypertrophy)   . Diabetes mellitus   . Hypertension   . Degenerative joint disease   . GERD (gastroesophageal reflux disease)   . CAD (coronary artery disease)   . Pneumonia    Assessment: 79yom on coumadin pta for afib, admitted with bradycardia and diarrhea. INR on admission > 10, 5mg  vitamin k and FFP given. INR down to 4.87 yesterday and 2.62 today so coumadin to resume. He was taking  amiodarone pta which has been stopped due to bradycardia so coumadin requirements may ultimately increase.   Home dose: 2.5mg  daily  Goal of Therapy:  INR 2-3 Monitor platelets by anticoagulation protocol: Yes   Plan:  1) Coumadin 5mg  x 1 2) Daily INR  Fredrik Rigger 02/16/2015,2:56 PM

## 2015-02-16 NOTE — Progress Notes (Signed)
Paged on call, Pt is apnic and stops breathing for 10-15 secs while sleeping, then starts to mouth breathe when he strts back. Want to see if I can get CPAP orders

## 2015-02-17 LAB — COMPREHENSIVE METABOLIC PANEL
ALT: 123 U/L — ABNORMAL HIGH (ref 17–63)
ANION GAP: 11 (ref 5–15)
AST: 70 U/L — ABNORMAL HIGH (ref 15–41)
Albumin: 3 g/dL — ABNORMAL LOW (ref 3.5–5.0)
Alkaline Phosphatase: 93 U/L (ref 38–126)
BUN: 55 mg/dL — ABNORMAL HIGH (ref 6–20)
CALCIUM: 8.3 mg/dL — AB (ref 8.9–10.3)
CO2: 20 mmol/L — ABNORMAL LOW (ref 22–32)
CREATININE: 1.75 mg/dL — AB (ref 0.61–1.24)
Chloride: 105 mmol/L (ref 101–111)
GFR, EST AFRICAN AMERICAN: 42 mL/min — AB (ref >60)
GFR, EST NON AFRICAN AMERICAN: 36 mL/min — AB (ref >60)
Glucose, Bld: 129 mg/dL — ABNORMAL HIGH (ref 65–99)
Potassium: 4.6 mmol/L (ref 3.5–5.1)
Sodium: 136 mmol/L (ref 135–145)
TOTAL PROTEIN: 5.6 g/dL — AB (ref 6.5–8.1)
Total Bilirubin: 2.3 mg/dL — ABNORMAL HIGH (ref 0.3–1.2)

## 2015-02-17 LAB — BLOOD GAS, ARTERIAL
Acid-Base Excess: 0.3 mmol/L (ref 0.0–2.0)
Bicarbonate: 24.2 mEq/L — ABNORMAL HIGH (ref 20.0–24.0)
DRAWN BY: 313941
Delivery systems: POSITIVE
EXPIRATORY PAP: 5
FIO2: 0.4 %
Inspiratory PAP: 14
LHR: 10 {breaths}/min
O2 Saturation: 99.1 %
PATIENT TEMPERATURE: 98.6
PH ART: 7.42 (ref 7.350–7.450)
TCO2: 25.4 mmol/L (ref 0–100)
pCO2 arterial: 38.1 mmHg (ref 35.0–45.0)
pO2, Arterial: 138 mmHg — ABNORMAL HIGH (ref 80.0–100.0)

## 2015-02-17 LAB — OCCULT BLOOD X 1 CARD TO LAB, STOOL: FECAL OCCULT BLD: NEGATIVE

## 2015-02-17 LAB — URINE CULTURE: Colony Count: 6000

## 2015-02-17 LAB — CBC
HEMATOCRIT: 34.1 % — AB (ref 39.0–52.0)
HEMOGLOBIN: 10.9 g/dL — AB (ref 13.0–17.0)
MCH: 29.4 pg (ref 26.0–34.0)
MCHC: 32 g/dL (ref 30.0–36.0)
MCV: 91.9 fL (ref 78.0–100.0)
Platelets: 272 10*3/uL (ref 150–400)
RBC: 3.71 MIL/uL — AB (ref 4.22–5.81)
RDW: 15 % (ref 11.5–15.5)
WBC: 7.8 10*3/uL (ref 4.0–10.5)

## 2015-02-17 LAB — GLUCOSE, CAPILLARY
Glucose-Capillary: 140 mg/dL — ABNORMAL HIGH (ref 65–99)
Glucose-Capillary: 136 mg/dL — ABNORMAL HIGH (ref 65–99)
Glucose-Capillary: 137 mg/dL — ABNORMAL HIGH (ref 65–99)
Glucose-Capillary: 158 mg/dL — ABNORMAL HIGH (ref 65–99)

## 2015-02-17 LAB — PROTIME-INR
INR: 2.44 — ABNORMAL HIGH (ref 0.00–1.49)
Prothrombin Time: 26.7 seconds — ABNORMAL HIGH (ref 11.6–15.2)

## 2015-02-17 LAB — POC OCCULT BLOOD, ED: FECAL OCCULT BLD: NEGATIVE

## 2015-02-17 LAB — T3, FREE: T3, Free: 2.5 pg/mL (ref 2.0–4.4)

## 2015-02-17 LAB — BRAIN NATRIURETIC PEPTIDE: B Natriuretic Peptide: 1599.2 pg/mL — ABNORMAL HIGH (ref 0.0–100.0)

## 2015-02-17 MED ORDER — SODIUM CHLORIDE 0.9 % IV SOLN
INTRAVENOUS | Status: AC
  Administered 2015-02-17: 14:00:00 via INTRAVENOUS

## 2015-02-17 MED ORDER — WARFARIN SODIUM 5 MG PO TABS
5.0000 mg | ORAL_TABLET | Freq: Once | ORAL | Status: AC
  Administered 2015-02-17: 5 mg via ORAL
  Filled 2015-02-17: qty 1

## 2015-02-17 NOTE — Progress Notes (Signed)
ANTICOAGULATION CONSULT NOTE - Follow Up Consult  Pharmacy Consult for Coumadin Indication: atrial fibrillation  No Known Allergies  Patient Measurements: Height: 5\' 9"  (175.3 cm) Weight: 199 lb (90.266 kg) IBW/kg (Calculated) : 70.7 Heparin Dosing Weight:    Vital Signs: Temp: 97.9 F (36.6 C) (05/16 0500) Temp Source: Axillary (05/16 0500)  Labs:  Recent Labs  02/14/15 1744 02/14/15 1803 02/15/15 0306 02/16/15 0404 02/16/15 1353 02/17/15 0400  HGB 10.7* 11.6*  --   --   --  10.9*  HCT 33.8* 34.0*  --   --   --  34.1*  PLT 279  --   --   --   --  272  LABPROT >90.0*  --  45.8*  --  28.3* 26.7*  INR >10.00*  --  4.87*  --  2.62* 2.44*  CREATININE 1.78* 1.70* 1.70* 1.61*  --  1.75*    Estimated Creatinine Clearance: 39.3 mL/min (by C-G formula based on Cr of 1.75).   Assessment: 79yom on coumadin pta for afib, admitted with bradycardia (30's) and diarrhea, ARF. INR on admission > 10, 5mg  vitamin k and FFP given.  Anticoag: Taking amiodarone PTA which has been stopped due to bradycardia so coumadin requirements may ultimately increase. INR 10>4.87>2.62>2.44 today. Hgb 10.9 stable. -Home dose: 2.5mg  daily  ID: Has positive leukocyte esterase and nitrites, but minimal white cells on micro. Also grossly bloody urine so difficult to interpret. Hold off on antibiotics in the setting of continued nausea and diarrhea unless he develops symptoms of UTI or positive urine culture<<sent.  Cards: PAF, HLD, HTN, CAD, CHF,VSS on Losartan, Pravachol. ProBNP 1599.  Endo: DM: SSI with CBGs <157. iatrogenically hyperthyroid on amio (hold), Syntrhoid also on hold.  GI: GERD. Admitted with diarrhea (Cdiff negative), now C/o constipation  Neuro: Low back pain, Anxiety, depression,Lexapro  Renal: Stage 3 CKD, BPH. Scr worsening 1.61>1.75. Hydrate (possibly hypoperfusion from brady)  Pulm: Apneic while sleeping.    Goal of Therapy:  INR 2-3 Monitor platelets by anticoagulation  protocol: Yes   Plan:  Repeat Coumadin 5mg  po x 1 tonight. Daily INR  Eufemia Prindle S. Merilynn Finland, PharmD, BCPS Clinical Staff Pharmacist Pager [phone]  Misty Stanley Stillinger 02/17/2015,11:12 AM

## 2015-02-17 NOTE — Progress Notes (Signed)
Called by RN about apneic episodes while asleep. Respiratory therapy place patient on BiPAP for same. Patient not wanting to wear it, pulling it off, and is intermittently confused. Sats are okay. Respiratory therapy recommending an ABG which is reasonable. However, if patient refuses BiPAP, would not force the issue. Suspect patient has chronic, previously undiagnosed sleep apnea. Left message with patient's daughter. Discussed with Dr. Waynard Edwards. He reports that patient told him previously he would not want a defibrillator replaced, but pacemaker not discussed. Dr. Waynard Edwards reports patient has been a full code in the past.  Crista Curb, MD

## 2015-02-17 NOTE — Progress Notes (Signed)
Report given that patient had multiple apneic episodes throughout night. MD and respiratory made aware of patient's need for BiPAP, continued bradycardia 26-30, and drowsiness & confusion by day shift RN. Patient tolerated BiPAP for a couple of hours prior to pulling off on multiple occassions and refusing to wear. Patient transitioned back to venturi mask, and began to also pull off device. Patient becoming increasingly agitated. MD made aware. ABG & sitter ordered & pending. Respiratory present at bedside. BiPAP currently in place. Will continue to monitor.

## 2015-02-17 NOTE — Progress Notes (Signed)
Upon arrival patient was on 40% Venturi Mask with sats 84%. RT placed patient back on BiPAP due to apneic episode and desat again. RN stated patient had pulled mask off earlier, however, we will try again. Patient seems much more restful with BiPAP mask on. RN called MD to inform her that patient is still having these episodes and is still very sleepy. ABG ordered. Will continue to monitor.

## 2015-02-17 NOTE — Progress Notes (Signed)
RT called due to patient having frequent apneic episodes and desaturation while sleeping. RT initiated BiPAP 14/5 with a backup rate of 8, 40%. Patient fell right to sleep after being situated on the mask. Will continue to monitor.

## 2015-02-17 NOTE — Plan of Care (Signed)
Problem: Discharge Progression Outcomes Goal: Tolerating diet Outcome: Not Progressing Patient continues to have very poor appetite. Encouraged and assisted with meals, with little progress. Patient states he is "not hungry". MD aware. Will continue to monitor.

## 2015-02-17 NOTE — Progress Notes (Signed)
[facility] PROGRESS NOTE  [name] ZOX:[id number] DOB: May 19, 1935 DOA: 02/14/2015 PCP: Ezequiel Kayser, MD  Assessment/Plan:  Principal Problem:   Bradycardia with 31 - 40 beats per minute continues. May need pacemaker per Dr. Graciela Husbands Active Problems:   Dyspnea: Chest x-ray with pulmonary edema. BNP 1500. Better after Lasix yesterday. Doubt pneumonia.   Atrial fibrillation   Elevated INR (international normalized ratio):  Now within therapeutic range after FFP and vitamin K. Pharmacy managing.   Acute renal injury   Nausea and Diarrhea:  C. difficile negative. GI pathogen panel pending. Not much per flexiseal. Will d/c. Patient reports eating a bit of breakfast today, but nurse says not much.  Acute on Chronic systolic heart failure   DM (diabetes mellitus), type 2 with renal complications:  Continue sliding scale insulin   CKD (chronic kidney disease), stage III   Hypothyroidism: On Synthroid as an outpatient. TSH low and free T4 high consistent with iatrogenic hyperthyroidism. stopped Synthroid.   Abnormal urinalysis:  Has positive leukocyte esterase and nitrites, but minimal white cells on micro. Also grossly bloody urine so difficult to interpret. Hold off on antibiotics in the setting of continued nausea and diarrhea unless he develops symptoms of UTI or positive urine culture. Anemia: Patient had gross hematuria. Diarrhea. Hematuria has improved. Cardiology ordered Hemoccults. Will likely be positive given the amount of diarrhea he had, coagulopathy, and flexiseal. Would not scope unless overt GI bleed.  Code Status:  full Family Communication:  Discussed with daughter by phone on 5/14. Disposition Plan:     HPI/Subjective: A bit of breakfast. Per nurse, not eating much. Dyspnea improved.  Objective: Filed Vitals:   02/17/15 0500  BP:   Pulse:   Temp: 97.9 F (36.6 C)  Resp:     Intake/Output Summary (Last 24 hours) at 02/17/15 1127 Last data filed at 02/17/15  0900  Gross per 24 hour  Intake    360 ml  Output    925 ml  Net   -565 ml   Filed Weights   02/14/15 2320 02/16/15 0500 02/17/15 0500  Weight: 90.714 kg (199 lb 15.8 oz) 92.216 kg (203 lb 4.8 oz) 90.266 kg (199 lb)   Telemetry: atrial fibrillation, rate in the 30s  Exam:   General:  Asleep. Arousable. Appears weak.  HEENT: MMM  Cardiovascular: Slow. Irregularly irregular no murmurs gallops rubs appreciated  Respiratory: Clear to auscultation bilaterally without wheezes rhonchi or rales  Abdomen: Soft, normal bowel sounds, nondistended. Minimal lower abdominal tenderness  Rectal: flexiseal with about the same amount of stool is yesterday  Ext: No clubbing cyanosis or edema   Basic Metabolic Panel:  Recent Labs Lab 02/14/15 1744 02/14/15 1803 02/14/15 2004 02/15/15 0306 02/16/15 0404 02/17/15 0400  NA 137 138  --  138 137 136  K 4.0 3.9  --  4.4 4.2 4.6  CL 102 101  --  104 103 105  CO2 23  --   --  22 22 20*  GLUCOSE 157* 155*  --  163* 125* 129*  BUN 49* 42*  --  46* 47* 55*  CREATININE 1.78* 1.70*  --  1.70* 1.61* 1.75*  CALCIUM 8.5*  --   --  8.6* 8.5* 8.3*  MG  --   --  3.2*  --   --   --    Liver Function Tests:  Recent Labs Lab 02/14/15 1744 02/17/15 0400  AST 80* 70*  ALT 107* 123*  ALKPHOS 60 93  BILITOT 2.2*  2.3*  PROT 6.2* 5.6*  ALBUMIN 3.5 3.0*    Recent Labs Lab 02/14/15 1744  LIPASE 24   No results for input(s): AMMONIA in the last 168 hours. CBC:  Recent Labs Lab 02/14/15 1744 02/14/15 1803 02/17/15 0400  WBC 9.3  --  7.8  HGB 10.7* 11.6* 10.9*  HCT 33.8* 34.0* 34.1*  MCV 94.2  --  91.9  PLT 279  --  272   Cardiac Enzymes: No results for input(s): CKTOTAL, CKMB, CKMBINDEX, TROPONINI in the last 168 hours. BNP (last 3 results)  Recent Labs  02/17/15 0400  BNP 1599.2*    ProBNP (last 3 results) No results for input(s): PROBNP in the last 8760 hours.  CBG:  Recent Labs Lab 02/16/15 0750 02/16/15 1133  02/16/15 1644 02/16/15 2104 02/17/15 0800  GLUCAP 113* 141* 157* 145* 140*    Recent Results (from the past 240 hour(s))  Clostridium Difficile by PCR     Status: None   Collection Time: 02/14/15  8:15 PM  Result Value Ref Range Status   C difficile by pcr NEGATIVE NEGATIVE Final  MRSA PCR Screening     Status: None   Collection Time: 02/14/15 11:10 PM  Result Value Ref Range Status   MRSA by PCR NEGATIVE NEGATIVE Final    Comment:        The GeneXpert MRSA Assay (FDA approved for NASAL specimens only), is one component of a comprehensive MRSA colonization surveillance program. It is not intended to diagnose MRSA infection nor to guide or monitor treatment for MRSA infections.      Studies: Dg Chest Port 1 View  02/16/2015   CLINICAL DATA:  Shortness of breath, CHF.  EXAM: PORTABLE CHEST - 1 VIEW  COMPARISON:  02/09/2015  FINDINGS: Cardiomegaly and CABG changes again noted.  Pulmonary vascular congestion with possible mild interstitial edema again noted.  There is no evidence of pneumothorax.  Mild bilateral lower lung atelectasis/airspace disease again noted.  IMPRESSION: Little significant change. Bilateral lower lung atelectasis/ airspace disease. Pneumonia not excluded.  Cardiomegaly with pulmonary vascular congestion and possible mild interstitial edema.   Electronically Signed   By: Harmon Pier M.D.   On: 02/16/2015 16:28    Scheduled Meds: . escitalopram  20 mg Oral Daily  . finasteride  5 mg Oral Daily  . insulin aspart  0-5 Units Subcutaneous QHS  . insulin aspart  0-9 Units Subcutaneous TID WC  . losartan  25 mg Oral Daily  . pravastatin  20 mg Oral q1800  . sodium chloride  3 mL Intravenous Q12H  . Warfarin - Pharmacist Dosing Inpatient   Does not apply q1800   Continuous Infusions: . sodium chloride      Time spent: 25 minutes  [name]  [facility] Pager [phone] [email or web address], password [facility] 02/17/2015, 11:27 AM  LOS: 3 days

## 2015-02-17 NOTE — Progress Notes (Signed)
Pt resting comfortably on 3 lpm [place] upon entering, RN in room. VS all WNL. Told RN to call if further assessment is needed, and will continue to monitor.

## 2015-02-17 NOTE — Progress Notes (Signed)
.       Patient Name: [name]      SUBJECTIVE admitted  with diarrhea and weakness and found to have acute renal failure ( Cr1.7<<1.3) and bradycardia with HR 30s  His wife has been ill and at nursing home.  and some confusion w meds  He is iatrogenically hyperthyroid on amio which has now been held 2/2 bradycardia; synthroid also on hold  No complaints of dyspnea but has had chest discomfort for most of the last few days and also complaining of lower abd pain  Past Medical History  Diagnosis Date  . Chest discomfort   . Low back pain   . Ischemic cardiomyopathy     EF 23% => improved to 40-45%;  Echo 5/14:  Mild LVH, EF 20-25%, mid to distal anteroseptal, anterior, inferior and apical AK, mild AI, mean AV gradient 4, moderate MR(Limited interrogation), moderate LAE, mild reduced RVSF, mild to moderate TR, peak RV-RA gradient 40  . Hearing loss   . SOB (shortness of breath)   . Joint pain   . VT (ventricular tachycardia)     s/p ICD explanted for infection.  The decision has been not to reimplant this given these difficulties.  . Anxiety   . Depression   . PAF (paroxysmal atrial fibrillation)   . Hyperlipidemia   . Gynecomastia   . BPH (benign prostatic hypertrophy)   . Diabetes mellitus   . Hypertension   . Degenerative joint disease   . GERD (gastroesophageal reflux disease)   . CAD (coronary artery disease)   . Pneumonia     Scheduled Meds:  Scheduled Meds: . escitalopram  20 mg Oral Daily  . finasteride  5 mg Oral Daily  . insulin aspart  0-5 Units Subcutaneous QHS  . insulin aspart  0-9 Units Subcutaneous TID WC  . losartan  25 mg Oral Daily  . pravastatin  20 mg Oral q1800  . sodium chloride  3 mL Intravenous Q12H  . Warfarin - Pharmacist Dosing Inpatient   Does not apply q1800   Continuous Infusions:  acetaminophen **OR** acetaminophen, ALPRAZolam, alum & mag hydroxide-simeth, loperamide, ondansetron **OR** ondansetron (ZOFRAN) IV    PHYSICAL  EXAM Filed Vitals:   02/16/15 0500 02/16/15 1632 02/16/15 2000 02/17/15 0500  BP: 155/50 120/49    Pulse: 34 67    Temp: 98 F (36.7 C) 97.8 F (36.6 C) 98.9 F (37.2 C) 97.9 F (36.6 C)  TempSrc: Axillary Oral Axillary Axillary  Resp: 18 22    Height:      Weight: 92.216 kg (203 lb 4.8 oz)   90.266 kg (199 lb)  SpO2: 99% 99%      Well developed and nourished in no acute distress HENT normal Neck supple   Clear Slow and irregularly irregular rate and rhythm, no murmurs or gallops Abd-soft with active BS No Clubbing cyanosis edema Skin-warm and dry A & Oriented  Grossly normal sensory and motor function  TELEMETRY: Reviewed telemetry pt in afib with HR 30's    Intake/Output Summary (Last 24 hours) at 02/17/15 0812 Last data filed at 02/17/15 0500  Gross per 24 hour  Intake    240 ml  Output    925 ml  Net   -685 ml    LABS: Basic Metabolic Panel:  Recent Labs Lab 02/14/15 1744 02/14/15 1803 02/14/15 2004 02/15/15 0306 02/16/15 0404 02/17/15 0400  NA 137 138  --  138 137 136  K 4.0 3.9  --  4.4 4.2 4.6  CL 102 101  --  104 103 105  CO2 23  --   --  22 22 20*  GLUCOSE 157* 155*  --  163* 125* 129*  BUN 49* 42*  --  46* 47* 55*  CREATININE 1.78* 1.70*  --  1.70* 1.61* 1.75*  CALCIUM 8.5*  --   --  8.6* 8.5* 8.3*  MG  --   --  3.2*  --   --   --    Cardiac Enzymes: No results for input(s): CKTOTAL, CKMB, CKMBINDEX, TROPONINI in the last 72 hours. CBC:  Recent Labs Lab 02/14/15 1744 02/14/15 1803 02/17/15 0400  WBC 9.3  --  7.8  HGB 10.7* 11.6* 10.9*  HCT 33.8* 34.0* 34.1*  MCV 94.2  --  91.9  PLT 279  --  272   PROTIME:  Recent Labs  02/15/15 0306 02/16/15 1353 02/17/15 0400  LABPROT 45.8* 28.3* 26.7*  INR 4.87* 2.62* 2.44*   Liver Function Tests:  Recent Labs  02/14/15 1744 02/17/15 0400  AST 80* 70*  ALT 107* 123*  ALKPHOS 60 93  BILITOT 2.2* 2.3*  PROT 6.2* 5.6*  ALBUMIN 3.5 3.0*    Recent Labs  02/14/15 1744  LIPASE  24   BNP: BNP (last 3 results) No results for input(s): BNP in the last 8760 hours.  ProBNP (last 3 results) No results for input(s): PROBNP in the last 8760 hours.  D-Dimer: No results for input(s): DDIMER in the last 72 hours. Hemoglobin A1C: No results for input(s): HGBA1C in the last 72 hours. Fasting Lipid Panel: No results for input(s): CHOL, HDL, LDLCALC, TRIG, CHOLHDL, LDLDIRECT in the last 72 hours. Thyroid Function Tests:  Recent Labs  02/16/15 0406 02/16/15 0930  TSH 0.047*  --   T3FREE  --  2.5       ASSESSMENT AND PLAN:  Principal Problem:   Bradycardia with 31 - 40 beats per minute Active Problems:   Atrial fibrillation   Elevated INR (international normalized ratio) due to prior anticoagulant medication ingestion   Acute renal injury   Diarrhea   Dehydration   Chronic systolic heart failure   DM (diabetes mellitus), type 2 with renal complications   CKD (chronic kidney disease), stage III   Hypothyroidism  Abd pain  Will check stool quiac   Hgb13-14>>>10-11;  Increasing BUN may be from blood, although significant bleeding is unlikely given his complaint of constipation today  will give fluid as worsening renal function may be 2/2 hypoperfusion 2/2 bradycardia Continue to observe i suspect he will come to pacing  Abd pain workup per IM I gather he had diarrhea    Signed, Sherryl Manges MD  02/17/2015

## 2015-02-17 NOTE — Progress Notes (Signed)
PT Cancellation Note  Patient Details Name: [name] MRN: [id number] DOB: September 21, 1935   Cancelled Treatment:    Reason Eval/Treat Not Completed: Medical issues which prohibited therapy; attempted twice today to see pt.  First pt in pain and wanting to get pain meds prior to mobilizing RN aware.  Second attempt, pt sleeping and just put on Bipap.  Will attempt again tomorrow.   WYNN,CYNDI 02/17/2015, 2:06 PM  [name], PT [phone] 02/17/2015

## 2015-02-17 NOTE — Progress Notes (Signed)
Medicare Important Message given? YES   (If response is "NO", the following Medicare IM given date fields will be blank)   Date Medicare IM given:   Medicare IM given by: Graves-Bigelow, Kaylin Marcon  

## 2015-02-18 ENCOUNTER — Inpatient Hospital Stay (HOSPITAL_COMMUNITY): Payer: Medicare Other

## 2015-02-18 DIAGNOSIS — R0681 Apnea, not elsewhere classified: Secondary | ICD-10-CM | POA: Clinically undetermined

## 2015-02-18 DIAGNOSIS — I441 Atrioventricular block, second degree: Secondary | ICD-10-CM | POA: Insufficient documentation

## 2015-02-18 DIAGNOSIS — I482 Chronic atrial fibrillation: Secondary | ICD-10-CM

## 2015-02-18 LAB — CBC
HCT: 35.6 % — ABNORMAL LOW (ref 39.0–52.0)
Hemoglobin: 11.2 g/dL — ABNORMAL LOW (ref 13.0–17.0)
MCH: 29.2 pg (ref 26.0–34.0)
MCHC: 31.5 g/dL (ref 30.0–36.0)
MCV: 92.7 fL (ref 78.0–100.0)
Platelets: 272 10*3/uL (ref 150–400)
RBC: 3.84 MIL/uL — ABNORMAL LOW (ref 4.22–5.81)
RDW: 15.1 % (ref 11.5–15.5)
WBC: 8.5 10*3/uL (ref 4.0–10.5)

## 2015-02-18 LAB — PROTIME-INR
INR: 3.01 — AB (ref 0.00–1.49)
Prothrombin Time: 31.5 seconds — ABNORMAL HIGH (ref 11.6–15.2)

## 2015-02-18 LAB — GI PATHOGEN PANEL BY PCR, STOOL
C difficile toxin A/B: NOT DETECTED
CAMPYLOBACTER BY PCR: NOT DETECTED
CRYPTOSPORIDIUM BY PCR: NOT DETECTED
E COLI (ETEC) LT/ST: NOT DETECTED
E coli (STEC): NOT DETECTED
E coli 0157 by PCR: NOT DETECTED
G LAMBLIA BY PCR: NOT DETECTED
NOROVIRUS G1/G2: NOT DETECTED
Rotavirus A by PCR: NOT DETECTED
Salmonella by PCR: NOT DETECTED
Shigella by PCR: NOT DETECTED

## 2015-02-18 LAB — BASIC METABOLIC PANEL
Anion gap: 9 (ref 5–15)
BUN: 52 mg/dL — ABNORMAL HIGH (ref 6–20)
CO2: 24 mmol/L (ref 22–32)
Calcium: 8.6 mg/dL — ABNORMAL LOW (ref 8.9–10.3)
Chloride: 104 mmol/L (ref 101–111)
Creatinine, Ser: 1.62 mg/dL — ABNORMAL HIGH (ref 0.61–1.24)
GFR calc non Af Amer: 39 mL/min — ABNORMAL LOW (ref >60)
GFR, EST AFRICAN AMERICAN: 46 mL/min — AB (ref >60)
Glucose, Bld: 127 mg/dL — ABNORMAL HIGH (ref 65–99)
POTASSIUM: 4.5 mmol/L (ref 3.5–5.1)
Sodium: 137 mmol/L (ref 135–145)

## 2015-02-18 LAB — GLUCOSE, CAPILLARY
GLUCOSE-CAPILLARY: 160 mg/dL — AB (ref 65–99)
Glucose-Capillary: 109 mg/dL — ABNORMAL HIGH (ref 65–99)
Glucose-Capillary: 159 mg/dL — ABNORMAL HIGH (ref 65–99)
Glucose-Capillary: 130 mg/dL — ABNORMAL HIGH (ref 65–99)

## 2015-02-18 MED ORDER — GI COCKTAIL ~~LOC~~
30.0000 mL | Freq: Once | ORAL | Status: AC
  Administered 2015-02-18: 30 mL via ORAL
  Filled 2015-02-18: qty 30

## 2015-02-18 MED ORDER — PERFLUTREN LIPID MICROSPHERE
1.0000 mL | INTRAVENOUS | Status: AC | PRN
  Filled 2015-02-18: qty 10

## 2015-02-18 MED ORDER — WARFARIN SODIUM 2.5 MG PO TABS: 2.5000 mg | ORAL_TABLET | Freq: Every day | ORAL | Status: DC

## 2015-02-18 NOTE — Progress Notes (Signed)
SUBJECTIVE: The patient is stable today.  He denies chest pain.  He is still having diarrhea.  He has not had dizziness or syncope. Still c/o abdominal pain.   CURRENT MEDICATIONS: . escitalopram  20 mg Oral Daily  . finasteride  5 mg Oral Daily  . insulin aspart  0-5 Units Subcutaneous QHS  . insulin aspart  0-9 Units Subcutaneous TID WC  . losartan  25 mg Oral Daily  . pravastatin  20 mg Oral q1800  . sodium chloride  3 mL Intravenous Q12H  . Warfarin - Pharmacist Dosing Inpatient   Does not apply q1800      OBJECTIVE: Physical Exam: Filed Vitals:   02/18/15 0034 02/18/15 0400 02/18/15 0534 02/18/15 0942  BP: 129/44  121/44 143/44  Pulse: 38  35 33  Temp:  97.4 F (36.3 C)    TempSrc:  Oral    Resp: 12  13   Height:      Weight:      SpO2: 95%  96% 100%    Intake/Output Summary (Last 24 hours) at 02/18/15 1246 Last data filed at 02/18/15 1116  Gross per 24 hour  Intake      3 ml  Output    651 ml  Net   -648 ml    Telemetry reveals atrial fibrillation with slow ventricular response, rates 30's  GEN- The patient is elderly and chronically ill appearing, alert and oriented x 3 today.   Head- normocephalic, atraumatic Eyes-  Sclera clear, conjunctiva pink Ears- hearing intact Oropharynx- clear Neck- supple, no JVP Lymph- no cervical lymphadenopathy Lungs- Clear to ausculation bilaterally, normal work of breathing Heart- Bradycardic irregular rate and rhythm  GI- soft, NT, ND, + BS Extremities- no clubbing, cyanosis, or edema Skin- no rash or lesion Psych- euthymic mood, full affect Neuro- strength and sensation are intact  LABS: Basic Metabolic Panel:  Recent Labs  96/43/83 0400 02/18/15 0256  NA 136 137  K 4.6 4.5  CL 105 104  CO2 20* 24  GLUCOSE 129* 127*  BUN 55* 52*  CREATININE 1.75* 1.62*  CALCIUM 8.3* 8.6*   Liver Function Tests:  Recent Labs  02/17/15 0400  AST 70*  ALT 123*  ALKPHOS 93  BILITOT 2.3*  PROT 5.6*  ALBUMIN 3.0*    CBC:  Recent Labs  02/17/15 0400 02/18/15 0256  WBC 7.8 8.5  HGB 10.9* 11.2*  HCT 34.1* 35.6*  MCV 91.9 92.7  PLT 272 272   Thyroid Function Tests:  Recent Labs  02/16/15 0406 02/16/15 0930  TSH 0.047*  --   T3FREE  --  2.5   RADIOLOGY: Dg Chest Port 1 View 02/16/2015   CLINICAL DATA:  Shortness of breath, CHF.  EXAM: PORTABLE CHEST - 1 VIEW  COMPARISON:  02/09/2015  FINDINGS: Cardiomegaly and CABG changes again noted.  Pulmonary vascular congestion with possible mild interstitial edema again noted.  There is no evidence of pneumothorax.  Mild bilateral lower lung atelectasis/airspace disease again noted.  IMPRESSION: Little significant change. Bilateral lower lung atelectasis/ airspace disease. Pneumonia not excluded.  Cardiomegaly with pulmonary vascular congestion and possible mild interstitial edema.   Electronically Signed   By: Harmon Pier M.D.   On: 02/16/2015 16:28    ASSESSMENT AND PLAN:  Principal Problem:   Bradycardia with 31 - 40 beats per minute Active Problems:   Atrial fibrillation   Elevated INR (international normalized ratio) due to prior anticoagulant medication ingestion   Acute renal injury  Diarrhea   Dehydration   Chronic systolic heart failure   DM (diabetes mellitus), type 2 with renal complications   CKD (chronic kidney disease), stage III   Hypothyroidism   Dyspnea   Abnormal urinalysis   Chronic systolic CHF (congestive heart failure)   Witnessed apneic spells  1.  Atrial fibrillation with slow ventricular response and intermittent complete heart block The patient presents with AF with ventricular rates in the 30's and intermittent complete heart block.  He has metabolic abnormalities consistent with decreased perfusion.  His Amiodarone has been held without improvement in heart rates.  Discussed with Dr Graciela Husbands, he feels that we should proceed with pacemaker at this time.  Will tentatively schedule for tomorrow afternoon pending further  discussion with Dr Graciela Husbands in the morning.  Continue Warfarin for CHADS2VASC score of at least 4 - will hold today in anticipation of pacemaker implant tomorrow.  The patient has permanent atrial fibrillation and has had prior device infection requiring extraction.  Leadless pacemaker may be a reasonable option; however, not sure he is a study candidate. Will ask the research team to screen the patient's chart today.   2.  Chronic systolic heart failure Euvolemic on exam Resume medical therapy once bradycardia/renal failure resolved Will update echo  3.  Acute on chronic renal failure Stable today Hopefully will resolve post pacing  4.  Elevated LFT's  Will hold statin until resolved  5.  Dyspnea With apneic episodes yesterday per nursing/RT Question if pulmonary consult would be beneficial - will defer to primary team  6.  Abdominal pain/diarrhea Per primary team  Gypsy Balsam, NP 02/18/2015 12:55 PM   I have seen, examined the patient, and reviewed the above assessment and plan. One exam, he is sleeping but rouses, minimally interactive and ill appearing.  Changes to above are made where necessary.  I worry that his prognosis is very poor.  He appears to have multiple organ failure.  The question remains as to whether or not he will improve significantly with pacing.  He has advanced AV block.  Supratherapeutic INR today.  Not a candidate for leadless pacing per our research team.  Will therefore proceed with single chamber pacemaker at the next available time, once INR stabilizes.  Co Sign: Hillis Range, MD 02/18/2015 3:59 PM

## 2015-02-18 NOTE — Clinical Social Work Note (Signed)
CSW spoke with Doylene Canard, SW [phone] ext. [phone] at [facility]. Melanie confirmed bed offer. Shawna Orleans confirmed that the pt will be sharing a room with his wife [name]. CSW informed the pt's stepdaughter [name]. CSW will continue to follow and assist.    Azhane Eckart, MSW, LCSWA [phone]

## 2015-02-18 NOTE — Clinical Social Work Placement (Signed)
   CLINICAL SOCIAL WORK PLACEMENT  NOTE  Date:  02/18/2015  Patient Details  Name: [name] MRN: [id number] Date of Birth: May 15, 1935  Clinical Social Work is seeking post-discharge placement for this patient at the Skilled  Nursing Facility level of care (*CSW will initial, date and re-position this form in  chart as items are completed):  Yes   Patient/family provided with [place] Clinical Social Work Department's list of facilities offering this level of care within the geographic area requested by the patient (or if unable, by the patient's family).  Yes   Patient/family informed of their freedom to choose among providers that offer the needed level of care, that participate in Medicare, Medicaid or managed care program needed by the patient, have an available bed and are willing to accept the patient.  Yes   Patient/family informed of [facility]'s ownership interest in [facility] and [facility], as well as of the fact that they are under no obligation to receive care at these facilities.  PASRR submitted to EDS on 02/18/15     PASRR number received on 02/18/15     Existing PASRR number confirmed on       FL2 transmitted to all facilities in geographic area requested by pt/family on 02/18/15     FL2 transmitted to all facilities within larger geographic area on       Patient informed that his/her managed care company has contracts with or will negotiate with certain facilities, including the following:            Patient/family informed of bed offers received.  Patient chooses bed at       Physician recommends and patient chooses bed at      Patient to be transferred to   on  .  Patient to be transferred to facility by       Patient family notified on   of transfer.  Name of family member notified:        PHYSICIAN       Additional Comment:    _______________________________________________ Gwynne Edinger, LCSW 02/18/2015, 12:45 PM

## 2015-02-18 NOTE — Progress Notes (Addendum)
[facility] PROGRESS NOTE  DANA DORNER ZOX:[id number] DOB: 18-Sep-1935 DOA: 02/14/2015 PCP: Ezequiel Kayser, MD  Summary 80 year old white male with history of atrial fibrillation on amiodarone and warfarin therapy prior to admission. Admitted for bradycardia into the 20s and 30s, acute gastroenteritis, and coagulopathy. There was concern that he was taking his medications incorrectly. Coagulopathy corrected. Amiodarone stopped. C. difficile has been negative. Cardiology following. Also noted to have apneic spells while asleep.  Assessment/Plan:  Principal Problem:   Bradycardia with 31 - 40 beats per minute continues. May need pacemaker per Dr. Graciela Husbands, but patient may not agree. Had an ICD explanted for infection in the past.  Patient is currently lucid and reports he wants to continue full CODE STATUS Active Problems:   Dyspnea: Chest x-ray with pulmonary edema. BNP 1500. Better after Lasix 5/15. Doubt pneumonia. No cough fevers or chills.   Atrial fibrillation   Elevated INR (international normalized ratio):   s/p FFP and vitamin K. Pharmacy managing.   Acute renal injury improving   Nausea and Diarrhea:  C. difficile negative. GI pathogen panel pending. No reported vomiting, but appetite marginal.  Acute on Chronic systolic heart failure:  Echo in 2014 showed an ejection fraction of 20-25%.   DM (diabetes mellitus), type 2 with renal complications:  Continue sliding scale insulin   CKD (chronic kidney disease), stage III   Hypothyroidism: On Synthroid as an outpatient. TSH low and free T4 high consistent with iatrogenic hyperthyroidism. stopped Synthroid.   Abnormal urinalysis:  Has positive leukocyte esterase and nitrites, but minimal white cells on micro. Also grossly bloody urine when. Urine culture negative. No antibiotics unless develops symptoms. Anemia: Patient had gross hematuria. Diarrhea. Hematuria has improved. Hemoccult stool negative. Deconditioning: Working with PT  now. I suspect he will eventually need placement once medically stable. Wife is in a nursing home. Stepdaughter is difficult to contact and it's unclear how often she checks on patient. Apneic episodes while asleep: Suspect chronic previously undiagnosed sleep apnea. Respiratory therapy place patient on a BiPAP yesterday while asleep and he didn't really tolerate it well. ABG looked good yesterday.  Code Status:  full Family Communication:  Discussed with daughter in law by phone Disposition Plan:  Continue step down monitoring.   HPI/Subjective: "I want to know what's going on!" Denies cough or shortness of breath. Wants to be full code.   Objective: Filed Vitals:   02/18/15 0534  BP: 121/44  Pulse: 35  Temp:   Resp: 13    Intake/Output Summary (Last 24 hours) at 02/18/15 0957 Last data filed at 02/18/15 0500  Gross per 24 hour  Intake      0 ml  Output    651 ml  Net   -651 ml   Filed Weights   02/14/15 2320 02/16/15 0500 02/17/15 0500  Weight: 90.714 kg (199 lb 15.8 oz) 92.216 kg (203 lb 4.8 oz) 90.266 kg (199 lb)   Telemetry: atrial fibrillation, rate in the 39  Exam:   General:  In chair. More alert and conversant than I've seen him all admission. Cantankerous but otherwise appropriate.   HEENT: MMM  Cardiovascular: Slow. Irregularly irregular no murmurs gallops rubs appreciated  Respiratory: Clear to auscultation bilaterally without wheezes rhonchi or rales  Abdomen: Soft, normal bowel sounds, nondistended. Minimal lower abdominal tenderness  Rectal: flexiseal with about the same amount of stool is yesterday  Ext: No clubbing cyanosis or edema   Basic Metabolic Panel:  Recent Labs Lab 02/14/15 1744 02/14/15 1803  02/14/15 2004 02/15/15 0306 02/16/15 0404 02/17/15 0400 02/18/15 0256  NA 137 138  --  138 137 136 137  K 4.0 3.9  --  4.4 4.2 4.6 4.5  CL 102 101  --  104 103 105 104  CO2 23  --   --  22 22 20* 24  GLUCOSE 157* 155*  --  163* 125* 129*  127*  BUN 49* 42*  --  46* 47* 55* 52*  CREATININE 1.78* 1.70*  --  1.70* 1.61* 1.75* 1.62*  CALCIUM 8.5*  --   --  8.6* 8.5* 8.3* 8.6*  MG  --   --  3.2*  --   --   --   --    Liver Function Tests:  Recent Labs Lab 02/14/15 1744 02/17/15 0400  AST 80* 70*  ALT 107* 123*  ALKPHOS 60 93  BILITOT 2.2* 2.3*  PROT 6.2* 5.6*  ALBUMIN 3.5 3.0*    Recent Labs Lab 02/14/15 1744  LIPASE 24   No results for input(s): AMMONIA in the last 168 hours. CBC:  Recent Labs Lab 02/14/15 1744 02/14/15 1803 02/17/15 0400 02/18/15 0256  WBC 9.3  --  7.8 8.5  HGB 10.7* 11.6* 10.9* 11.2*  HCT 33.8* 34.0* 34.1* 35.6*  MCV 94.2  --  91.9 92.7  PLT 279  --  272 272   Cardiac Enzymes: No results for input(s): CKTOTAL, CKMB, CKMBINDEX, TROPONINI in the last 168 hours. BNP (last 3 results)  Recent Labs  02/17/15 0400  BNP 1599.2*    ProBNP (last 3 results) No results for input(s): PROBNP in the last 8760 hours.  CBG:  Recent Labs Lab 02/17/15 0800 02/17/15 1131 02/17/15 1603 02/17/15 2129 02/18/15 0737  GLUCAP 140* 136* 137* 158* 109*    Recent Results (from the past 240 hour(s))  Clostridium Difficile by PCR     Status: None   Collection Time: 02/14/15  8:15 PM  Result Value Ref Range Status   C difficile by pcr NEGATIVE NEGATIVE Final  MRSA PCR Screening     Status: None   Collection Time: 02/14/15 11:10 PM  Result Value Ref Range Status   MRSA by PCR NEGATIVE NEGATIVE Final    Comment:        The GeneXpert MRSA Assay (FDA approved for NASAL specimens only), is one component of a comprehensive MRSA colonization surveillance program. It is not intended to diagnose MRSA infection nor to guide or monitor treatment for MRSA infections.   Culture, Urine     Status: None   Collection Time: 02/16/15  5:00 PM  Result Value Ref Range Status   Specimen Description URINE, CLEAN CATCH  Final   Special Requests NONE  Final   Colony Count   Final    6,000  COLONIES/ML Performed at [facility]    Culture   Final    INSIGNIFICANT GROWTH Performed at [facility]    Report Status 02/17/2015 FINAL  Final     Studies: Dg Chest Port 1 View  02/16/2015   CLINICAL DATA:  Shortness of breath, CHF.  EXAM: PORTABLE CHEST - 1 VIEW  COMPARISON:  02/09/2015  FINDINGS: Cardiomegaly and CABG changes again noted.  Pulmonary vascular congestion with possible mild interstitial edema again noted.  There is no evidence of pneumothorax.  Mild bilateral lower lung atelectasis/airspace disease again noted.  IMPRESSION: Little significant change. Bilateral lower lung atelectasis/ airspace disease. Pneumonia not excluded.  Cardiomegaly with pulmonary vascular congestion and possible mild  interstitial edema.   Electronically Signed   By: Harmon Pier M.D.   On: 02/16/2015 16:28    Scheduled Meds: . escitalopram  20 mg Oral Daily  . finasteride  5 mg Oral Daily  . insulin aspart  0-5 Units Subcutaneous QHS  . insulin aspart  0-9 Units Subcutaneous TID WC  . losartan  25 mg Oral Daily  . pravastatin  20 mg Oral q1800  . sodium chloride  3 mL Intravenous Q12H  . Warfarin - Pharmacist Dosing Inpatient   Does not apply q1800   Continuous Infusions:    Time spent: 25 minutes  Aneth Schlagel L  [facility] Pager [phone] [email or web address], password [facility] 02/18/2015, 9:57 AM  LOS: 4 days

## 2015-02-18 NOTE — Progress Notes (Addendum)
ANTICOAGULATION CONSULT NOTE - Follow Up Consult  Pharmacy Consult for Coumadin Indication: atrial fibrillation  No Known Allergies  Patient Measurements: Height: 5\' 9"  (175.3 cm) Weight: 199 lb (90.266 kg) IBW/kg (Calculated) : 70.7 Heparin Dosing Weight:    Vital Signs: Temp: 97.4 F (36.3 C) (05/17 0400) Temp Source: Oral (05/17 0400) BP: 143/44 mmHg (05/17 0942) Pulse Rate: 33 (05/17 0942)  Labs:  Recent Labs  02/16/15 0404 02/16/15 1353 02/17/15 0400 02/18/15 0256  HGB  --   --  10.9* 11.2*  HCT  --   --  34.1* 35.6*  PLT  --   --  272 272  LABPROT  --  28.3* 26.7* 31.5*  INR  --  2.62* 2.44* 3.01*  CREATININE 1.61*  --  1.75* 1.62*    Estimated Creatinine Clearance: 42.4 mL/min (by C-G formula based on Cr of 1.62).   Assessment: 79yom on coumadin pta for afib, admitted with bradycardia (30's) and diarrhea, ARF. INR on admission > 10, 5mg  vitamin k and FFP given.  Anticoag: Taking amiodarone PTA which has been stopped due to bradycardia so coumadin requirements may ultimately increase. INR 10>4.87>2.62>2.44>3.01 today. Hgb 11.2 stable. -Home dose: 2.5mg  daily  ID: Has positive leukocyte esterase and nitrites, but minimal white cells on micro. Also grossly bloody urine so difficult to interpret. Hold off on antibiotics in the setting of continued nausea and diarrhea unless he develops symptoms of UTI or positive urine culture: insignificant growth. GI pathogen panel pending. Hematuria has improved. Hemoccult stool negative.  Cards: PAF, HLD, HTN, CAD, CHF,BP 143/44, HR 33 on Losartan, Pravachol. ProBNP 1599.  Endo: DM: SSI with CBGs <158. iatrogenically hyperthyroid on amio (hold), Synthroid also on hold.  GI: GERD. Admitted with diarrhea (Cdiff negative), now C/o constipation  Neuro: Low back pain, Anxiety, depression, on Lexapro  Renal: Stage 3 CKD, BPH. Scr 1.62 Hydrate (possibly hypoperfusion from brady)  Pulm: Apneic while sleeping.   Goal of  Therapy:  INR 2-3 Monitor platelets by anticoagulation protocol: Yes   Plan:  Resume home Coumadin 2.5mg  daily Daily INR  Adden: 1250 Hold Coumadin today per MD request for PPM.   [name] S. [name], PharmD, BCPS Clinical Staff Pharmacist Pager [phone]  [name] 02/18/2015,10:42 AM

## 2015-02-18 NOTE — Evaluation (Signed)
Physical Therapy Evaluation Patient Details Name: [name] MRN: [id number] DOB: 01-16-1935 Today's Date: 02/18/2015   History of Present Illness  Patient is a 80 y/o male admitted with history of atrial fibrillation on amiodarone and warfarin therapy prior to admission. Admitted for bradycardia into the 20s and 30s, acute gastroenteritis, and coagulopathy.  Clinical Impression  Patient presents with decreased independence with mobility due to deficits listed in PT problem list.  Patient is high risk for falls and has decreased awareness of deficits and poor judgement and would not be safe to return home alone.  Recommend SNF level rehab at d/c; (note wife is in SNF in [place]).    Follow Up Recommendations SNF    Equipment Recommendations  None recommended by PT    Recommendations for Other Services       Precautions / Restrictions Precautions Precautions: Fall Precaution Comments: incontinent of diarrhea Restrictions Weight Bearing Restrictions: No      Mobility  Bed Mobility Overal bed mobility: Needs Assistance Bed Mobility: Supine to Sit;Sit to Supine     Supine to sit: Min assist;HOB elevated Sit to supine: Supervision   General bed mobility comments: assist to lift trunk upright, cues for positioning once in supine  Transfers Overall transfer level: Needs assistance Equipment used: Rolling walker (2 wheeled) Transfers: Sit to/from Stand Sit to Stand: Min assist         General transfer comment: up from bed, then onto 3:1 in bathroom assist for safety; balance, then lifting help up from 3:1 to recliner and then back to bed due to c/o fatigue and feels like falling  Ambulation/Gait Ambulation/Gait assistance: Min assist;Supervision Ambulation Distance (Feet): 12 Feet Assistive device: Rolling walker (2 wheeled) Gait Pattern/deviations: Step-through pattern;Trunk flexed     General Gait Details: assist and cues to stay inside walker, difficulty managing  O2 tank, and assisting patient through narrow path in room with bipap, heart monitor, computer, vitals machine and chairs; assisted pt to recliner after in bathoom and wheeled back to bed due to pt report he would fall trying to walk back due to weakness  Stairs            Wheelchair Mobility    Modified Rankin (Stroke Patients Only)       Balance Overall balance assessment: Needs assistance         Standing balance support: Single extremity supported;Bilateral upper extremity supported;During functional activity Standing balance-Leahy Scale: Poor Standing balance comment: stood holding grabbar in bathroom while assisting with perineal hygiene. then bathroom too narrow to fit walker so provided hand hold assist into bathroom.                             Pertinent Vitals/Pain Pain Assessment: Faces Faces Pain Scale: Hurts even more Pain Location: sore bottom Pain Descriptors / Indicators: Discomfort Pain Intervention(s): Monitored during session  HR 33, SpO2 100% on 3L O2; BP 143/44    Home Living Family/patient expects to be discharged to:: Skilled nursing facility Living Arrangements: Alone   Type of Home: House Home Access: Stairs to enter   [employer] of Steps: 2 Home Layout: Two level;Able to live on main level with bedroom/bathroom Home Equipment: Walker - 2 wheels      Prior Function Level of Independence: Independent         Comments: question if patient taking meds correctly; wife in NH in [place] x 6 weeks due to hip fx  Hand Dominance        Extremity/Trunk Assessment   Upper Extremity Assessment: Generalized weakness           Lower Extremity Assessment: Generalized weakness      Cervical / Trunk Assessment: Kyphotic  Communication   Communication: No difficulties  Cognition Arousal/Alertness: Awake/alert Behavior During Therapy: Anxious Overall Cognitive Status: No family/caregiver present to determine  baseline cognitive functioning       Memory: Decreased short-term memory (does not recall why in hospital, but knows events that led to admission)              General Comments General comments (skin integrity, edema, etc.): pt aware of bowel incontinence, but unable to control.    Exercises        Assessment/Plan    PT Assessment Patient needs continued PT services  PT Diagnosis Abnormality of gait;Generalized weakness   PT Problem List Decreased strength;Decreased balance;Decreased activity tolerance;Decreased mobility;Decreased safety awareness;Decreased knowledge of use of DME  PT Treatment Interventions DME instruction;Therapeutic exercise;Gait training;Balance training;Functional mobility training;Therapeutic activities;Patient/family education   PT Goals (Current goals can be found in the Care Plan section) Acute Rehab PT Goals Patient Stated Goal: To go home PT Goal Formulation: Patient unable to participate in goal setting Time For Goal Achievement: 03/04/15 Potential to Achieve Goals: Fair    Frequency Min 3X/week   Barriers to discharge Decreased caregiver support reports children all "dopeheads" and recently once child passed away; states doesn't have good relationship with step daughter     Co-evaluation               End of Session Equipment Utilized During Treatment: Gait belt;Oxygen Activity Tolerance: Patient limited by fatigue Patient left: in bed;with call bell/phone within reach           Time: 0915-0948 PT Time Calculation (min) (ACUTE ONLY): 33 min   Charges:   PT Evaluation $Initial PT Evaluation Tier I: 1 Procedure PT Treatments $Gait Training: 8-22 mins   PT G Codes:        [name],CYNDI 02/24/2015, 10:22 AM  [name], PT [phone] 02/24/15

## 2015-02-18 NOTE — Care Management Note (Addendum)
Case Management Note  Patient Details  Name: ROYAL KWIECIEN MRN: [id number] Date of Birth: 1935-07-11  Subjective/Objective:    Pt admitted for bradycardia into the 20s and 30s, acute gastroenteritis. Pt is from home.                Action/Plan:PT recommendations for SNF. CSW to assist with disposition needs. CM will continue to monitor.    Expected Discharge Date:                  Expected Discharge Plan:  Skilled Nursing Facility  In-House Referral:  Clinical Social Work  Discharge planning Services  CM Consult  Post Acute Care Choice:    Choice offered to:     DME Arranged:    DME Agency:     HH Arranged:    HH Agency:     Status of Service:    Medicare Important Message Given:  Yes Date Medicare IM Given:  02/17/15 Medicare IM give by:  Tomi Bamberger, RN,BSN  Date Additional Medicare IM Given:    Additional Medicare Important Message give by:      If discussed at Long Length of Stay Meetings, dates discussed:    Additional Comments:  Gala Lewandowsky, RN 02/18/2015, 10:59 AM

## 2015-02-18 NOTE — Clinical Social Work Note (Signed)
Clinical Social Work Assessment  Patient Details  Name: [name] MRN: [id number] Date of Birth: 1935-09-14  Date of referral:  02/18/15               Reason for consult:  Facility Placement              Housing/Transportation Living arrangements for the past 2 months:  Home Source of Information:  Patient Patient Interpreter Needed:  None Criminal Activity/Legal Involvement Pertinent to Current Situation/Hospitalization:  No - Comment as needed Significant Relationships:  Adult Children, Spouse Lives with:  Spouse Do you feel safe going back to the place where you live?  No Need for family participation in patient care:  No (Coment)  Care giving concerns:  NA   Social Worker assessment / plan:  CSW met the pt the bedside. CSW introduced self and purpose of the visit. CSW discussed SNF rehab. CSW explained the SNF process. CSW explained insurance and its relation to SNF placement. Pt declined SNF list. Pt reported that his wife is currently at [facility]. CSW and pt discussed geographic location in which the he would like to receive rehab. Pt reported that he would like to be with his wife. CSW answered all questions in which the pt inquired about. CSW will continue to follow this pt and assist with discharge as needed.   Insurance information:  Medicare PT Recommendations:  [place] / Referral to community resources:  [place]  Patient/Family's Response to care:  Pt reported the care in which he is receiving from the nurses is wonderful.   Patient/Family's Understanding of and Emotional Response to Diagnosis, Current Treatment, and Prognosis:  Pt expressed feeling of frustration because he feels as through he is being kept out of the loop about why he is here. Pt expressed a need to communicate with the physicians more.   Emotional Assessment Appearance:  Appears stated age Attitude/Demeanor/Rapport:  Other  (Positive) Affect (typically observed):  Appropriate Orientation:  Oriented to Self, Oriented to Place, Oriented to  Time, Oriented to Situation Alcohol / Substance use:  Not Applicable Psych involvement (Current and /or in the community):  No (Comment)  Discharge Needs  Concerns to be addressed:  Denies Needs/Concerns at this time Barriers to Discharge:  No Barriers Identified   [place], MSW, [place]

## 2015-02-19 ENCOUNTER — Encounter (HOSPITAL_COMMUNITY)
Admission: EM | Discharge status: Against Medical Advice | Payer: Self-pay | Source: Home / Self Care | Attending: Internal Medicine

## 2015-02-19 LAB — BASIC METABOLIC PANEL
Anion gap: 7 (ref 5–15)
BUN: 45 mg/dL — ABNORMAL HIGH (ref 6–20)
CHLORIDE: 103 mmol/L (ref 101–111)
CO2: 27 mmol/L (ref 22–32)
Calcium: 8.5 mg/dL — ABNORMAL LOW (ref 8.9–10.3)
Creatinine, Ser: 1.54 mg/dL — ABNORMAL HIGH (ref 0.61–1.24)
GFR calc non Af Amer: 42 mL/min — ABNORMAL LOW (ref >60)
GFR, EST AFRICAN AMERICAN: 48 mL/min — AB (ref >60)
GLUCOSE: 126 mg/dL — AB (ref 65–99)
Potassium: 4.7 mmol/L (ref 3.5–5.1)
Sodium: 137 mmol/L (ref 135–145)

## 2015-02-19 LAB — CBC
HEMATOCRIT: 35.4 % — AB (ref 39.0–52.0)
HEMOGLOBIN: 11 g/dL — AB (ref 13.0–17.0)
MCH: 28.6 pg (ref 26.0–34.0)
MCHC: 31.1 g/dL (ref 30.0–36.0)
MCV: 91.9 fL (ref 78.0–100.0)
Platelets: 287 10*3/uL (ref 150–400)
RBC: 3.85 MIL/uL — ABNORMAL LOW (ref 4.22–5.81)
RDW: 15.2 % (ref 11.5–15.5)
WBC: 6.3 10*3/uL (ref 4.0–10.5)

## 2015-02-19 LAB — GLUCOSE, CAPILLARY: GLUCOSE-CAPILLARY: 133 mg/dL — AB (ref 65–99)

## 2015-02-19 LAB — PROTIME-INR
INR: 4.15 — AB (ref 0.00–1.49)
Prothrombin Time: 40.4 seconds — ABNORMAL HIGH (ref 11.6–15.2)

## 2015-02-19 SURGERY — PACEMAKER IMPLANT

## 2015-02-19 MED ORDER — DEXTROSE 5 % IV SOLN
1.0000 mg | Freq: Once | INTRAVENOUS | Status: AC
  Administered 2015-02-19: 1 mg via INTRAVENOUS
  Filled 2015-02-19: qty 0.1

## 2015-02-19 NOTE — Progress Notes (Addendum)
Pt signed himself out AMA after multiple attempts to convince him otherwise. He was explained the risk of him going home without having his pacemaker. He stated that he had those before and " they didn't  worth a damn"  He was told that he is taking a risk of serious complication including death if he leaves against medical advice. He stated that he is prepared to die anyway since he is almost 80 years old. Pt was told that he needs oxygen supplementation at home but he declined to wait to have outpatient oxygen provided to him. Intervention from [facility] and attending physician were not successful. He was picked up by his step daughter [name].  Attending physician and cardiology team were notified.  Colleen Can, RN

## 2015-02-19 NOTE — Discharge Summary (Signed)
Physician Discharge Summary  [name] ZOX:[id number] DOB: 02-Nov-1934 DOA: 02/14/2015  PCP: Ezequiel Kayser, MD  Admit date: 02/14/2015 Discharge date: 02/19/2015  Time spent: 45 minutes   The patient was assigned to me today. Prior to me evaluating him, he told the nurse that he wanted to leave the hospital. The patient's stepdaughter came and I also went and spoke to the patient. We both tried to convince him for the need to stay and to have a pacemaker placed as recommended however he declined this. He was made aware that not having a pacemaker could lead to syncope and possible death. He stated that he is 80 years old and ready to die anyway. He left the hospital AGAINST MEDICAL ADVICE.   Recommendations for Outpatient Follow-up:  1. Cardiology follow-up for pacemaker recommended 2. Hold off on resuming amiodarone 3. Repeat LFTs prior to resuming statin 4. Has had some apneic episodes during the hospital stay - may need to be assessed further for sleep study  Discharge Condition: Guarded Diet recommendation: Low sodium heart healthy  Discharge Diagnoses:  Principal Problem:   Bradycardia with 31 - 40 beats per minute/Second degree Mobitz II AV block Active Problems:   Atrial fibrillation   Elevated INR (international normalized ratio) due to prior anticoagulant medication ingestion   Acute renal injury   Diarrhea   Dehydration   Chronic systolic heart failure   DM (diabetes mellitus), type 2 with renal complications   CKD (chronic kidney disease), stage III   Hypothyroidism   Chronic systolic CHF (congestive heart failure)   Witnessed apneic spells      History of present illness:  With a history of coronary artery disease, status post CABG 5 vessels, ischemic cardiomyopathy with systolic heart failure with an EF of 20-25% on echocardiogram done 02/28/2013, atrial fibrillation on amiodarone and warfarin, hypertension, hyperlipidemia who presented to the hospital with a  complaint of diarrhea for 3-4 days. He was noted to be bradycardic with a heart rate in the 30s and also had acute renal failure due to dehydration and therefore admitted to the hospital. INR was elevated at greater than 10.  Hospital Course:  Bradycardia - Noted to have A. fib with slow ventricular response and intermittent complete heart block - Amiodarone was held -Cardiology evaluated the patient and recommended that he receive a pacemaker but the patient declined this and decided today to leave AGAINST MEDICAL ADVICE  Elevated INR -Administered FFP and vitamin K with improvement INR- level today was 4.15  Acute renal failure Suspected to be due to dehydration-improvement noted with hydration  Diarrhea, vomiting -Possibly viral gastroenteritis-symptoms were steadily improving  Mildly elevated LFTs -Statin was being held  Chronic systolic heart failure with an EF of 20-25%  Diabetes mellitus type 2 with renal complications  CK D stage III -Creatinine has remained stable   Consultations:  Cardiology  Discharge Exam: Filed Weights   02/16/15 0500 02/17/15 0500 02/19/15 0542  Weight: 92.216 kg (203 lb 4.8 oz) 90.266 kg (199 lb) 92.534 kg (204 lb)   Filed Vitals:   02/19/15 1000  BP: 140/67  Pulse: 36  Temp:   Resp: 17    General: AAO x 3, no distress Cardiovascular: RRR, no murmurs  Respiratory: clear to auscultation bilaterally GI: soft, non-tender, non-distended, bowel sound positive  Discharge Instructions You were cared for by a hospitalist during your hospital stay. If you have any questions about your discharge medications or the care you received while you were in the  hospital after you are discharged, you can call the unit and asked to speak with the hospitalist on call if the hospitalist that took care of you is not available. Once you are discharged, your primary care physician will handle any further medical issues. Please note that NO REFILLS for any  discharge medications will be authorized once you are discharged, as it is imperative that you return to your primary care physician (or establish a relationship with a primary care physician if you do not have one) for your aftercare needs so that they can reassess your need for medications and monitor your lab values.     Medication List- no changes in his outpatient medication list have been made as he left AGAINST MEDICAL ADVICE-he and his stepdaughter have been verbally advised that he needs to stop taking the amiodarone and continue to hold off on taking the pravastatin     ASK your doctor about these medications        amiodarone 200 MG tablet  Commonly known as:  PACERONE  Take 1/2 tablet (100 mg total) by mouth 5 days a week.     escitalopram 20 MG tablet  Commonly known as:  LEXAPRO  Take 20 mg by mouth daily.     lactose free nutrition Liqd  Take 237 mLs by mouth 3 (three) times daily between meals.     levothyroxine 50 MCG tablet  Commonly known as:  SYNTHROID, LEVOTHROID  TAKE 1/2 TABLET BY MOUTH EVERY DAY     levothyroxine 125 MCG tablet  Commonly known as:  SYNTHROID, LEVOTHROID  Take 125 mcg by mouth daily.     losartan 50 MG tablet  Commonly known as:  COZAAR  Take 0.5 tablets (25 mg total) by mouth daily.     metroNIDAZOLE 500 MG tablet  Commonly known as:  FLAGYL  Take 500 mg by mouth daily.     pravastatin 20 MG tablet  Commonly known as:  PRAVACHOL  Take 20 mg by mouth daily.     sitaGLIPtin 50 MG tablet  Commonly known as:  JANUVIA  Take 25 mg by mouth as directed. 25 mg twice a week on Tuesdays and Fridays.     warfarin 5 MG tablet  Commonly known as:  COUMADIN  Take 1/2 tablet daily or as directed       No Known Allergies    The results of significant diagnostics from this hospitalization (including imaging, microbiology, ancillary and laboratory) are listed below for reference.    Significant Diagnostic Studies: Dg Chest Port 1  View  02/16/2015   CLINICAL DATA:  Shortness of breath, CHF.  EXAM: PORTABLE CHEST - 1 VIEW  COMPARISON:  02/09/2015  FINDINGS: Cardiomegaly and CABG changes again noted.  Pulmonary vascular congestion with possible mild interstitial edema again noted.  There is no evidence of pneumothorax.  Mild bilateral lower lung atelectasis/airspace disease again noted.  IMPRESSION: Little significant change. Bilateral lower lung atelectasis/ airspace disease. Pneumonia not excluded.  Cardiomegaly with pulmonary vascular congestion and possible mild interstitial edema.   Electronically Signed   By: Harmon Pier M.D.   On: 02/16/2015 16:28    Microbiology: Recent Results (from the past 240 hour(s))  Clostridium Difficile by PCR     Status: None   Collection Time: 02/14/15  8:15 PM  Result Value Ref Range Status   C difficile by pcr NEGATIVE NEGATIVE Final  MRSA PCR Screening     Status: None   Collection Time: 02/14/15 11:10 PM  Result Value Ref Range Status   MRSA by PCR NEGATIVE NEGATIVE Final    Comment:        The GeneXpert MRSA Assay (FDA approved for NASAL specimens only), is one component of a comprehensive MRSA colonization surveillance program. It is not intended to diagnose MRSA infection nor to guide or monitor treatment for MRSA infections.   Culture, Urine     Status: None   Collection Time: 02/16/15  5:00 PM  Result Value Ref Range Status   Specimen Description URINE, CLEAN CATCH  Final   Special Requests NONE  Final   Colony Count   Final    6,000 COLONIES/ML Performed at [facility]    Culture   Final    INSIGNIFICANT GROWTH Performed at [facility]    Report Status 02/17/2015 FINAL  Final     Labs: Basic Metabolic Panel:  Recent Labs Lab 02/14/15 2004 02/15/15 0306 02/16/15 0404 02/17/15 0400 02/18/15 0256 02/19/15 0346  NA  --  138 137 136 137 137  K  --  4.4 4.2 4.6 4.5 4.7  CL  --  104 103 105 104 103  CO2  --  22 22 20* 24 27  GLUCOSE   --  163* 125* 129* 127* 126*  BUN  --  46* 47* 55* 52* 45*  CREATININE  --  1.70* 1.61* 1.75* 1.62* 1.54*  CALCIUM  --  8.6* 8.5* 8.3* 8.6* 8.5*  MG 3.2*  --   --   --   --   --    Liver Function Tests:  Recent Labs Lab 02/14/15 1744 02/17/15 0400  AST 80* 70*  ALT 107* 123*  ALKPHOS 60 93  BILITOT 2.2* 2.3*  PROT 6.2* 5.6*  ALBUMIN 3.5 3.0*    Recent Labs Lab 02/14/15 1744  LIPASE 24   No results for input(s): AMMONIA in the last 168 hours. CBC:  Recent Labs Lab 02/14/15 1744 02/14/15 1803 02/17/15 0400 02/18/15 0256 02/19/15 0346  WBC 9.3  --  7.8 8.5 6.3  HGB 10.7* 11.6* 10.9* 11.2* 11.0*  HCT 33.8* 34.0* 34.1* 35.6* 35.4*  MCV 94.2  --  91.9 92.7 91.9  PLT 279  --  272 272 287   Cardiac Enzymes: No results for input(s): CKTOTAL, CKMB, CKMBINDEX, TROPONINI in the last 168 hours. BNP: BNP (last 3 results)  Recent Labs  02/17/15 0400  BNP 1599.2*    ProBNP (last 3 results) No results for input(s): PROBNP in the last 8760 hours.  CBG:  Recent Labs Lab 02/18/15 0737 02/18/15 1210 02/18/15 1644 02/18/15 2220 02/19/15 0732  GLUCAP 109* 159* 130* 160* 133*       SignedCalvert Cantor, MD [facility] 02/19/2015, 11:19 AM

## 2015-02-19 NOTE — Care Management Note (Signed)
Case Management Note  Patient Details  Name: [name] MRN: [id number] Date of Birth: 31-May-1935  Subjective/Objective:   Pt left AMA.                  Action/Plan: CSW made aware. No further needs. .   Expected Discharge Date:                  Expected Discharge Plan:  Skilled Nursing Facility  In-House Referral:  Clinical Social Work  Discharge planning Services  CM Consult  Post Acute Care Choice:    Choice offered to:     DME Arranged:    DME Agency:     HH Arranged:    HH Agency:     Status of Service:     Medicare Important Message Given:  Yes Date Medicare IM Given:  02/17/15 Medicare IM give by:  Tomi Bamberger, RN,BSN  Date Additional Medicare IM Given:    Additional Medicare Important Message give by:     If discussed at Long Length of Stay Meetings, dates discussed:    Additional Comments:  Gala Lewandowsky, RN 02/19/2015, 1:38 PM

## 2015-02-20 ENCOUNTER — Emergency Department (HOSPITAL_COMMUNITY): Payer: Medicare Other

## 2015-02-20 ENCOUNTER — Other Ambulatory Visit: Payer: Self-pay

## 2015-02-20 ENCOUNTER — Inpatient Hospital Stay (HOSPITAL_COMMUNITY)
Admission: EM | Admit: 2015-02-20 | Discharge: 2015-02-23 | DRG: 227 | Disposition: A | Discharge status: Home Health | Payer: Medicare Other | Attending: Internal Medicine | Admitting: Internal Medicine

## 2015-02-20 ENCOUNTER — Other Ambulatory Visit (HOSPITAL_COMMUNITY): Payer: Self-pay

## 2015-02-20 ENCOUNTER — Encounter (HOSPITAL_COMMUNITY): Payer: Self-pay | Admitting: *Deleted

## 2015-02-20 DIAGNOSIS — F329 Major depressive disorder, single episode, unspecified: Secondary | ICD-10-CM | POA: Diagnosis present

## 2015-02-20 DIAGNOSIS — R197 Diarrhea, unspecified: Secondary | ICD-10-CM | POA: Diagnosis not present

## 2015-02-20 DIAGNOSIS — A047 Enterocolitis due to Clostridium difficile: Secondary | ICD-10-CM | POA: Diagnosis not present

## 2015-02-20 DIAGNOSIS — I129 Hypertensive chronic kidney disease with stage 1 through stage 4 chronic kidney disease, or unspecified chronic kidney disease: Secondary | ICD-10-CM | POA: Diagnosis present

## 2015-02-20 DIAGNOSIS — I5022 Chronic systolic (congestive) heart failure: Secondary | ICD-10-CM | POA: Diagnosis not present

## 2015-02-20 DIAGNOSIS — I480 Paroxysmal atrial fibrillation: Secondary | ICD-10-CM | POA: Diagnosis present

## 2015-02-20 DIAGNOSIS — A0472 Enterocolitis due to Clostridium difficile, not specified as recurrent: Secondary | ICD-10-CM | POA: Diagnosis present

## 2015-02-20 DIAGNOSIS — I482 Chronic atrial fibrillation: Secondary | ICD-10-CM | POA: Diagnosis not present

## 2015-02-20 DIAGNOSIS — N183 Chronic kidney disease, stage 3 unspecified: Secondary | ICD-10-CM | POA: Diagnosis present

## 2015-02-20 DIAGNOSIS — R1084 Generalized abdominal pain: Secondary | ICD-10-CM | POA: Diagnosis not present

## 2015-02-20 DIAGNOSIS — E1129 Type 2 diabetes mellitus with other diabetic kidney complication: Secondary | ICD-10-CM | POA: Diagnosis present

## 2015-02-20 DIAGNOSIS — J9811 Atelectasis: Secondary | ICD-10-CM | POA: Diagnosis not present

## 2015-02-20 DIAGNOSIS — F419 Anxiety disorder, unspecified: Secondary | ICD-10-CM | POA: Diagnosis present

## 2015-02-20 DIAGNOSIS — E1122 Type 2 diabetes mellitus with diabetic chronic kidney disease: Secondary | ICD-10-CM | POA: Diagnosis present

## 2015-02-20 DIAGNOSIS — R401 Stupor: Secondary | ICD-10-CM | POA: Diagnosis not present

## 2015-02-20 DIAGNOSIS — N400 Enlarged prostate without lower urinary tract symptoms: Secondary | ICD-10-CM | POA: Diagnosis present

## 2015-02-20 DIAGNOSIS — I255 Ischemic cardiomyopathy: Secondary | ICD-10-CM | POA: Diagnosis present

## 2015-02-20 DIAGNOSIS — Z959 Presence of cardiac and vascular implant and graft, unspecified: Secondary | ICD-10-CM

## 2015-02-20 DIAGNOSIS — I442 Atrioventricular block, complete: Secondary | ICD-10-CM | POA: Diagnosis not present

## 2015-02-20 DIAGNOSIS — K219 Gastro-esophageal reflux disease without esophagitis: Secondary | ICD-10-CM | POA: Diagnosis present

## 2015-02-20 DIAGNOSIS — Z79899 Other long term (current) drug therapy: Secondary | ICD-10-CM | POA: Diagnosis not present

## 2015-02-20 DIAGNOSIS — I5020 Unspecified systolic (congestive) heart failure: Secondary | ICD-10-CM | POA: Diagnosis not present

## 2015-02-20 DIAGNOSIS — I5023 Acute on chronic systolic (congestive) heart failure: Secondary | ICD-10-CM

## 2015-02-20 DIAGNOSIS — E039 Hypothyroidism, unspecified: Secondary | ICD-10-CM | POA: Diagnosis present

## 2015-02-20 DIAGNOSIS — H9190 Unspecified hearing loss, unspecified ear: Secondary | ICD-10-CM | POA: Diagnosis present

## 2015-02-20 DIAGNOSIS — E785 Hyperlipidemia, unspecified: Secondary | ICD-10-CM | POA: Diagnosis present

## 2015-02-20 DIAGNOSIS — I4891 Unspecified atrial fibrillation: Secondary | ICD-10-CM

## 2015-02-20 DIAGNOSIS — I2510 Atherosclerotic heart disease of native coronary artery without angina pectoris: Secondary | ICD-10-CM | POA: Diagnosis present

## 2015-02-20 DIAGNOSIS — Z951 Presence of aortocoronary bypass graft: Secondary | ICD-10-CM

## 2015-02-20 DIAGNOSIS — Z7901 Long term (current) use of anticoagulants: Secondary | ICD-10-CM | POA: Diagnosis not present

## 2015-02-20 DIAGNOSIS — R001 Bradycardia, unspecified: Secondary | ICD-10-CM | POA: Diagnosis not present

## 2015-02-20 DIAGNOSIS — K828 Other specified diseases of gallbladder: Secondary | ICD-10-CM | POA: Diagnosis not present

## 2015-02-20 HISTORY — DX: Permanent atrial fibrillation: I48.21

## 2015-02-20 LAB — CBC WITH DIFFERENTIAL/PLATELET
BASOS ABS: 0 10*3/uL (ref 0.0–0.1)
BASOS PCT: 0 % (ref 0–1)
Eosinophils Absolute: 0.1 10*3/uL (ref 0.0–0.7)
Eosinophils Relative: 1 % (ref 0–5)
HEMATOCRIT: 37.8 % — AB (ref 39.0–52.0)
Hemoglobin: 11.7 g/dL — ABNORMAL LOW (ref 13.0–17.0)
Lymphocytes Relative: 11 % — ABNORMAL LOW (ref 12–46)
Lymphs Abs: 1 10*3/uL (ref 0.7–4.0)
MCH: 29 pg (ref 26.0–34.0)
MCHC: 31 g/dL (ref 30.0–36.0)
MCV: 93.8 fL (ref 78.0–100.0)
Monocytes Absolute: 1.3 10*3/uL — ABNORMAL HIGH (ref 0.1–1.0)
Monocytes Relative: 14 % — ABNORMAL HIGH (ref 3–12)
NEUTROS ABS: 6.7 10*3/uL (ref 1.7–7.7)
Neutrophils Relative %: 74 % (ref 43–77)
PLATELETS: 341 10*3/uL (ref 150–400)
RBC: 4.03 MIL/uL — ABNORMAL LOW (ref 4.22–5.81)
RDW: 15.2 % (ref 11.5–15.5)
WBC: 9 10*3/uL (ref 4.0–10.5)

## 2015-02-20 LAB — I-STAT CG4 LACTIC ACID, ED: LACTIC ACID, VENOUS: 3.22 mmol/L — AB (ref 0.5–2.0)

## 2015-02-20 LAB — COMPREHENSIVE METABOLIC PANEL
ALK PHOS: 97 U/L (ref 38–126)
ALT: 66 U/L — AB (ref 17–63)
AST: 31 U/L (ref 15–41)
Albumin: 3.4 g/dL — ABNORMAL LOW (ref 3.5–5.0)
Anion gap: 9 (ref 5–15)
BUN: 45 mg/dL — AB (ref 6–20)
CHLORIDE: 103 mmol/L (ref 101–111)
CO2: 26 mmol/L (ref 22–32)
Calcium: 8.8 mg/dL — ABNORMAL LOW (ref 8.9–10.3)
Creatinine, Ser: 1.53 mg/dL — ABNORMAL HIGH (ref 0.61–1.24)
GFR, EST AFRICAN AMERICAN: 49 mL/min — AB (ref >60)
GFR, EST NON AFRICAN AMERICAN: 42 mL/min — AB (ref >60)
Glucose, Bld: 161 mg/dL — ABNORMAL HIGH (ref 65–99)
POTASSIUM: 4.5 mmol/L (ref 3.5–5.1)
SODIUM: 138 mmol/L (ref 135–145)
TOTAL PROTEIN: 6.4 g/dL — AB (ref 6.5–8.1)
Total Bilirubin: 1.7 mg/dL — ABNORMAL HIGH (ref 0.3–1.2)

## 2015-02-20 LAB — URINALYSIS, ROUTINE W REFLEX MICROSCOPIC
Bilirubin Urine: NEGATIVE
GLUCOSE, UA: NEGATIVE mg/dL
KETONES UR: NEGATIVE mg/dL
LEUKOCYTES UA: NEGATIVE
Nitrite: NEGATIVE
Protein, ur: NEGATIVE mg/dL
Specific Gravity, Urine: >1.03 — ABNORMAL HIGH (ref 1.005–1.030)
Urobilinogen, UA: 0.2 mg/dL (ref 0.0–1.0)
pH: 5.5 (ref 5.0–8.0)

## 2015-02-20 LAB — POC OCCULT BLOOD, ED: Fecal Occult Bld: NEGATIVE

## 2015-02-20 LAB — CBG MONITORING, ED: Glucose-Capillary: 118 mg/dL — ABNORMAL HIGH (ref 65–99)

## 2015-02-20 LAB — GLUCOSE, CAPILLARY: GLUCOSE-CAPILLARY: 136 mg/dL — AB (ref 65–99)

## 2015-02-20 LAB — TROPONIN I: Troponin I: 0.03 ng/mL (ref <0.031)

## 2015-02-20 LAB — URINE MICROSCOPIC-ADD ON

## 2015-02-20 LAB — PROTIME-INR
INR: 2.35 — AB (ref 0.00–1.49)
PROTHROMBIN TIME: 25.5 s — AB (ref 11.6–15.2)

## 2015-02-20 MED ORDER — LEVOTHYROXINE SODIUM 125 MCG PO TABS: 125.0000 ug | ORAL_TABLET | Freq: Every day | ORAL | Status: DC

## 2015-02-20 MED ORDER — SODIUM CHLORIDE 0.9 % IV SOLN: Freq: Once | INTRAVENOUS | Status: DC

## 2015-02-20 MED ORDER — SODIUM CHLORIDE 0.9 % IJ SOLN
3.0000 mL | Freq: Two times a day (BID) | INTRAMUSCULAR | Status: DC
  Administered 2015-02-21: 3 mL via INTRAVENOUS

## 2015-02-20 MED ORDER — SODIUM CHLORIDE 0.9 % IJ SOLN
3.0000 mL | Freq: Two times a day (BID) | INTRAMUSCULAR | Status: DC
  Administered 2015-02-20 – 2015-02-21 (×2): 3 mL via INTRAVENOUS

## 2015-02-20 MED ORDER — SODIUM CHLORIDE 0.9 % IV BOLUS (SEPSIS)
500.0000 mL | Freq: Once | INTRAVENOUS | Status: AC
  Administered 2015-02-20: 500 mL via INTRAVENOUS

## 2015-02-20 MED ORDER — FUROSEMIDE 10 MG/ML IJ SOLN
40.0000 mg | Freq: Once | INTRAMUSCULAR | Status: AC
  Administered 2015-02-20: 40 mg via INTRAVENOUS
  Filled 2015-02-20: qty 4

## 2015-02-20 MED ORDER — ATROPINE SULFATE 1 MG/ML IJ SOLN
0.5000 mg | Freq: Once | INTRAMUSCULAR | Status: AC
  Administered 2015-02-20: 0.5 mg via INTRAVENOUS
  Filled 2015-02-20: qty 1

## 2015-02-20 MED ORDER — ATROPINE SULFATE 1 MG/ML IJ SOLN: 1.0000 mg | Freq: Once | INTRAMUSCULAR | Status: DC

## 2015-02-20 MED ORDER — BOOST PO LIQD
237.0000 mL | Freq: Three times a day (TID) | ORAL | Status: DC
  Administered 2015-02-21 – 2015-02-23 (×4): 237 mL via ORAL
  Filled 2015-02-20 (×11): qty 237

## 2015-02-20 MED ORDER — ALPRAZOLAM 0.5 MG PO TABS
0.5000 mg | ORAL_TABLET | Freq: Three times a day (TID) | ORAL | Status: DC | PRN
  Filled 2015-02-20: qty 1

## 2015-02-20 MED ORDER — ONDANSETRON HCL 4 MG/2ML IJ SOLN: 4.0000 mg | Freq: Four times a day (QID) | INTRAMUSCULAR | Status: DC | PRN

## 2015-02-20 MED ORDER — LEVOTHYROXINE SODIUM 125 MCG PO TABS
125.0000 ug | ORAL_TABLET | Freq: Every day | ORAL | Status: DC
  Administered 2015-02-21 – 2015-02-23 (×3): 125 ug via ORAL
  Filled 2015-02-20 (×4): qty 1

## 2015-02-20 MED ORDER — ATROPINE SULFATE 0.1 MG/ML IJ SOLN
INTRAMUSCULAR | Status: AC
  Filled 2015-02-20: qty 10

## 2015-02-20 MED ORDER — SODIUM CHLORIDE 0.9 % IV SOLN
1000.0000 mL | Freq: Once | INTRAVENOUS | Status: AC
  Administered 2015-02-20: 1000 mL via INTRAVENOUS

## 2015-02-20 MED ORDER — ONDANSETRON HCL 4 MG PO TABS: 4.0000 mg | ORAL_TABLET | Freq: Four times a day (QID) | ORAL | Status: DC | PRN

## 2015-02-20 MED ORDER — FINASTERIDE 5 MG PO TABS
5.0000 mg | ORAL_TABLET | Freq: Every day | ORAL | Status: DC
  Administered 2015-02-22 – 2015-02-23 (×2): 5 mg via ORAL
  Filled 2015-02-20 (×3): qty 1

## 2015-02-20 MED ORDER — LOSARTAN POTASSIUM 25 MG PO TABS
25.0000 mg | ORAL_TABLET | Freq: Every day | ORAL | Status: DC
  Administered 2015-02-22 – 2015-02-23 (×2): 25 mg via ORAL
  Filled 2015-02-20 (×3): qty 1

## 2015-02-20 NOTE — Progress Notes (Addendum)
Contacted by ER staff about patient, recent admit to [facility] and left AMA just yesterday from medicine service at [facility]. Had been seen by cardiology in consultation there for afib with slow response and intermittent heart block as well as chronic systolic heart failure. Was being considered for pacemaker but left AMA. Presents to [employer] with diarrhea and abdominal pain, once again found to be bradycardic. I recommend transfer from [facility] back to [facility] medicine team for continued management of GI issues, no indication for new cardiology consult here at [facility] as there is no additional therapies I can offer for his bradycardia here. [facility] cardiology may resume following patient once back at [facility] if patient is reconsidering pacemaker. Dominga Ferry MD

## 2015-02-20 NOTE — Progress Notes (Signed)
Pt HR remains 28-35 afib. Per floor RN K.Schorr NP paged and requested RN to page cardiology to update on Pt condition. Digital paging system down initially and Cardiology paged per phone x2. Upon my arrival Cardiology text paged with digital paging system. Pt unchanged from initial assessment. RN advised to await 15 minutes for return page and then call K. Tobin [facility] to update.

## 2015-02-20 NOTE — ED Provider Notes (Signed)
CSN: [id number]     Arrival date & time 02/20/15  1227 History   First MD Initiated Contact with Patient 02/20/15 1303     Chief Complaint  Patient presents with  . Bradycardia  . Diarrhea     (Consider location/radiation/quality/duration/timing/severity/associated sxs/prior Treatment) HPI Comments: Level V caveat. Patient is uncooperative. He presents via EMS with a 2 week history of abdominal pain, nausea, vomiting and diarrhea. He is unable to tell me the last time he had diarrhea. He complains of pain in the left side of his abdomen that has been ongoing for several weeks. He denies any fever. He denies any chest pain or shortness of breath. Notably he was left the hospital AMA yesterday after plans for a pacemaker to be placed for his ongoing bradycardia. He says he is wanting to come back in the hospital today. He denies hitting his head.  The history is provided by the patient and the EMS personnel. The history is limited by the condition of the patient.    Past Medical History  Diagnosis Date  . Chest discomfort   . Low back pain   . Ischemic cardiomyopathy     EF 23% => improved to 40-45%;  Echo 5/14:  Mild LVH, EF 20-25%, mid to distal anteroseptal, anterior, inferior and apical AK, mild AI, mean AV gradient 4, moderate MR(Limited interrogation), moderate LAE, mild reduced RVSF, mild to moderate TR, peak RV-RA gradient 40  . Hearing loss   . SOB (shortness of breath)   . Joint pain   . VT (ventricular tachycardia)     s/p ICD explanted for infection.  The decision has been not to reimplant this given these difficulties.  . Anxiety   . Depression   . PAF (paroxysmal atrial fibrillation)   . Hyperlipidemia   . Gynecomastia   . BPH (benign prostatic hypertrophy)   . Diabetes mellitus   . Hypertension   . Degenerative joint disease   . GERD (gastroesophageal reflux disease)   . CAD (coronary artery disease)   . Pneumonia    Past Surgical History  Procedure Laterality  Date  . Cardiac defibrillator removal    . Coronary artery bypass graft  2000    X5  . Cardiac catheterization  12/25/2008    THE LEFT VENTRICLE WAS ENLARGED. THERE IS ANTERIOR APICAL AND INFERIOR APICAL AKINESIA. EF ESTIMATED 20%. NO MITRAL REGURGITATION.  . Cardiac catheterization  12/2008    REPEAT CATH, SHOWED BYPASS GRAFTS WERE PATENT  . US echocardiography  2007    EF 20 to 25%  . Ablation of dysrhythmic focus    . Pars plana vitrectomy  09/24/2011    Procedure: PARS PLANA VITRECTOMY WITH 25 GAUGE;  Surgeon: Alford Highland Rankin;  Location: [facility] OR;  Service: Ophthalmology;  Laterality: Left;  MEMBRANE PEEL, SILICONE OIL  . Cardioversion N/A 11/20/2012    Procedure: CARDIOVERSION;  Surgeon: Pricilla Riffle, MD;  Location: [facility] ENDOSCOPY;  Service: Cardiovascular;  Laterality: N/A;  . Cardioversion N/A 12/28/2012    Procedure: CARDIOVERSION;  Surgeon: Cassell Clement, MD;  Location: [facility] ENDOSCOPY;  Service: Cardiovascular;  Laterality: N/A;  . Cataract extraction, bilateral     No family history on file. History  Substance Use Topics  . Smoking status: Never Smoker   . Smokeless tobacco: Never Used  . Alcohol Use: No     Comment: hx of in younger years, not now    Review of Systems  Constitutional: Positive for activity change, appetite change and fatigue.  Negative for fever.  HENT: Negative for congestion.   Eyes: Negative for visual disturbance.  Respiratory: Negative for cough, chest tightness and shortness of breath.   Cardiovascular: Negative for chest pain.  Gastrointestinal: Positive for nausea, vomiting, abdominal pain and diarrhea.  Genitourinary: Positive for testicular pain. Negative for dysuria and hematuria.  Musculoskeletal: Positive for myalgias and arthralgias.  Neurological: Positive for dizziness, weakness and light-headedness.  A complete 10 system review of systems was obtained and all systems are negative except as noted in the HPI and PMH.      Allergies  Review  of patient's allergies indicates no known allergies.  Home Medications   Prior to Admission medications   Medication Sig Start Date End Date Taking? Authorizing Provider  ALPRAZolam Prudy Feeler) 0.5 MG tablet Take 0.5 mg by mouth 3 (three) times daily as needed for anxiety.  02/10/15  Yes Historical Provider, MD  finasteride (PROSCAR) 5 MG tablet Take 5 mg by mouth daily.  02/02/15  Yes Historical Provider, MD  lactose free nutrition (BOOST) LIQD Take 237 mLs by mouth 3 (three) times daily between meals.   Yes Historical Provider, MD  levothyroxine (SYNTHROID, LEVOTHROID) 125 MCG tablet Take 125 mcg by mouth daily. 01/25/15  Yes Historical Provider, MD  losartan (COZAAR) 50 MG tablet Take 0.5 tablets (25 mg total) by mouth daily. 08/14/14  Yes Duke Salvia, MD  amiodarone (PACERONE) 200 MG tablet Take 1/2 tablet (100 mg total) by mouth 5 days a week. Patient taking differently: Take 100 mg by mouth daily.  05/03/14   Duke Salvia, MD  metroNIDAZOLE (FLAGYL) 500 MG tablet Take 500 mg by mouth 3 (three) times daily.  02/09/15   Historical Provider, MD  warfarin (COUMADIN) 5 MG tablet Take 1/2 tablet daily or as directed Patient not taking: Reported on 02/20/2015 07/01/14   Duke Salvia, MD   BP 143/67 mmHg  Pulse 31  Temp(Src) 97.8 F (36.6 C) (Oral)  Resp 20  SpO2 100% Physical Exam  Constitutional: He appears well-developed and well-nourished.  Dry mucus membranes  HENT:  Head: Normocephalic and atraumatic.  Mouth/Throat: Oropharynx is clear and moist. No oropharyngeal exudate.  Eyes: Conjunctivae and EOM are normal. Pupils are equal, round, and reactive to light.  Neck: Normal range of motion. Neck supple.  Cardiovascular: Normal rate and normal heart sounds.   Irregular bradycardia  Pulmonary/Chest: Effort normal. No respiratory distress. He exhibits no tenderness.  Abdominal: There is tenderness.  TTP LUQ and LLQ  Genitourinary:  Brown stool  Musculoskeletal: Normal range of motion.  He exhibits no edema or tenderness.  Neurological: He is alert. No cranial nerve deficit. He exhibits normal muscle tone. Coordination normal.  CN 2-12 intact, 5/5 strength throughout    ED Course  Procedures (including critical care time) Labs Review Labs Reviewed  CBC WITH DIFFERENTIAL/PLATELET - Abnormal; Notable for the following:    RBC 4.03 (*)    Hemoglobin 11.7 (*)    HCT 37.8 (*)    Lymphocytes Relative 11 (*)    Monocytes Relative 14 (*)    Monocytes Absolute 1.3 (*)    All other components within normal limits  COMPREHENSIVE METABOLIC PANEL - Abnormal; Notable for the following:    Glucose, Bld 161 (*)    BUN 45 (*)    Creatinine, Ser 1.53 (*)    Calcium 8.8 (*)    Total Protein 6.4 (*)    Albumin 3.4 (*)    ALT 66 (*)    Total  Bilirubin 1.7 (*)    GFR calc non Af Amer 42 (*)    GFR calc Af Amer 49 (*)    All other components within normal limits  PROTIME-INR - Abnormal; Notable for the following:    Prothrombin Time 25.5 (*)    INR 2.35 (*)    All other components within normal limits  URINALYSIS, ROUTINE W REFLEX MICROSCOPIC - Abnormal; Notable for the following:    Specific Gravity, Urine >1.030 (*)    Hgb urine dipstick SMALL (*)    All other components within normal limits  URINE MICROSCOPIC-ADD ON - Abnormal; Notable for the following:    Bacteria, UA FEW (*)    All other components within normal limits  I-STAT CG4 LACTIC ACID, ED - Abnormal; Notable for the following:    Lactic Acid, Venous 3.22 (*)    All other components within normal limits  TROPONIN I  COMPREHENSIVE METABOLIC PANEL  CBC  PROTIME-INR  I-STAT CG4 LACTIC ACID, ED  POC OCCULT BLOOD, ED    Imaging Review Ct Abdomen Pelvis Wo Contrast  02/20/2015   CLINICAL DATA:  Persistent diarrhea.  Bradycardia.  EXAM: CT ABDOMEN AND PELVIS WITHOUT CONTRAST  TECHNIQUE: Multidetector CT imaging of the abdomen and pelvis was performed following the standard protocol without IV contrast.   COMPARISON:  08/09/2011 report, images not available  FINDINGS: Coronary artery calcifications with post CABG changes. Bilateral pleural effusions with compressive atelectasis. Pleural effusions are small to moderate in size. Negative for free air.  Unenhanced CT was performed per clinician order. Lack of IV contrast limits sensitivity and specificity, especially for evaluation of abdominal/pelvic solid viscera.  There is a small amount of perihepatic and perisplenic ascites. The gallbladder is mildly distended with high-density material suggestive for sludge and cannot exclude gallstones. No gross abnormality to the spleen or pancreas. Normal appearance of the adrenal glands and kidneys. Negative for kidney stones or hydronephrosis. There is a small amount of fluid or edema around the duodenum. No significant lymphadenopathy. There is edema or stranding in the lower abdomen around the iliac vessels, particularly on the left side. This appears to be extraperitoneal fluid. There is diffuse subcutaneous edema. There is edema in the presacral region. No gross abnormality to the prostate or urinary bladder.  No gross abnormality to the small or large bowel. A normal appearing appendix.  Disc space narrowing and disease at L4-L5. No acute bone abnormality.  IMPRESSION: Bilateral pleural effusions with a small amount of intra-abdominal fluid and extensive subcutaneous edema. Findings are suggestive for anasarca.  The gallbladder is distended with high-density material that could represent sludge. Gallbladder could be further characterized with ultrasound.   Electronically Signed   By: Richarda Overlie M.D.   On: 02/20/2015 15:32   Ct Head Wo Contrast  02/20/2015   CLINICAL DATA:  LOC, diarrhea  EXAM: CT HEAD WITHOUT CONTRAST  TECHNIQUE: Contiguous axial images were obtained from the base of the skull through the vertex without intravenous contrast.  COMPARISON:  12/04/2009  FINDINGS: No skull fracture is noted. Paranasal  sinuses and mastoid air cells are unremarkable. Atherosclerotic calcifications of carotid siphon again noted. Stable atrophy and chronic white matter disease. No acute cortical infarction. No mass lesion is noted on this unenhanced scan. Ventricular size is stable from prior exam.  IMPRESSION: No acute intracranial abnormality. Stable atrophy and chronic white matter disease. No acute cortical infarction.   Electronically Signed   By: Natasha Mead M.D.   On: 02/20/2015 15:32  Dg Chest Portable 1 View  02/20/2015   CLINICAL DATA:  Persistent diarrhea, bradycardia, history diabetes mellitus, hypertension, paroxysmal atrial fibrillation, coronary artery disease post MI and and CABG, ischemic cardiomyopathy  EXAM: PORTABLE CHEST - 1 VIEW  COMPARISON:  Portable exam 1329 hours compared to 02/16/2015  FINDINGS: Enlargement of cardiac silhouette post CABG.  Slight pulmonary vascular congestion.  Bibasilar atelectasis.  Lungs otherwise clear.  No pleural effusion or pneumothorax.  IMPRESSION: Enlargement of cardiac silhouette with pulmonary vascular congestion post CABG.  Mild bibasilar atelectasis.   Electronically Signed   By: Ulyses Southward M.D.   On: 02/20/2015 14:00     EKG Interpretation   Date/Time:  Thursday Feb 20 2015 12:31:41 EDT Ventricular Rate:  40 PR Interval:  57 QRS Duration: 121 QT Interval:  647 QTC Calculation: 528 R Axis:   -66 Text Interpretation:  Atrial fibrillation Confirmed by Manus Gunning  MD,  Ayianna Darnold [phone]) on 02/20/2015 2:24:21 PM      MDM   Final diagnoses:  Bradycardia  Systolic congestive heart failure, unspecified congestive heart failure chronicity   Persistent lower abdominal pain, nausea, vomiting and diarrhea. Left AMA yesterday. Patient bradycardic with slow atrial fibrillation.  Patient with persistent slow atrial fibrillation as during previous admission. His mental status and blood pressure stable. He is awake and talking.  Chest x-ray shows small pleural  effusions. IV fluids will be stopped. Patient has no respiratory distress.  Lactate 3.3. Creatinine improving from previous. Patient with left sided lower abdominal pain. Noncontrast CT was obtained which shows anasarca.  Patient did receive 1 L of IV fluids prior to my evaluation. Further fluids are stopped given the findings of his chest x-ray. IV Lasix was given. Patient is on no respiratory distress. Sats 100% on 2 L.  Discussed with Dr. Wyline Mood of cardiology who agrees patient should be transferred back to [facility] for consideration of pacemaker. Mental status and blood pressure stable.  Dr. Karilyn Cota to admit patient and arrange transfer.  CRITICAL CARE Performed by: Glynn Octave Total critical care time: 30 Critical care time was exclusive of separately billable procedures and treating other patients. Critical care was necessary to treat or prevent imminent or life-threatening deterioration. Critical care was time spent personally by me on the following activities: development of treatment plan with patient and/or surrogate as well as nursing, discussions with consultants, evaluation of patient's response to treatment, examination of patient, obtaining history from patient or surrogate, ordering and performing treatments and interventions, ordering and review of laboratory studies, ordering and review of radiographic studies, pulse oximetry and re-evaluation of patient's condition.   Glynn Octave, MD 02/20/15 2056

## 2015-02-20 NOTE — ED Notes (Signed)
Called [facility] with bed assignment.  They will send a truck.

## 2015-02-20 NOTE — Progress Notes (Signed)
Call received from floor RN regarding Pt heart rhythym. EKG done showing Acute MI. No call back received yet from Cardiology. [facility] NP K. Schorr at bedside to see Pt and review EKG. Per K.Schorr Dr.Bensimon en route to hospital to see Pt. Will await cardiology assessment to determine if transfer is needed. Pt resting in bed, no acute changes at this time.

## 2015-02-20 NOTE — Progress Notes (Signed)
Called per [facility] RN regarding new admit from [facility] ED. Per RN pt with HR 28-33, afib, pale, with stable BP. Pt left AMA 5/18 after refusing a pacemaker. Pt found resting in bed, awake follows commands, pale. Complains of mild indigestion and stomach cramping, denies chest pain. HR remains 28-30s, p02 100% on 2 LNC.  RN advised to place atropine at bedside and notify [facility] of Pt arrival and await orders. At this time Pt appears stable and unchanged from when seen by MD at [facility], however his low HR is concerning to remain on telemetry.

## 2015-02-20 NOTE — Progress Notes (Signed)
  Called by nursing staff to assess patient with low HRs.   Was here 2 days ago and was going to have PPM placed for AF with slow VR. However he signed out AMA.  Now readmitted for same thing.   He is quite cantankerous and saying he only wants to see Dr. Graciela Husbands. Will not engage in meaningful conversation with me. SBP in 150s. Rhythm unchanged from previous.    Will continue to monitor. EP to see in am.    [name], [name],MD 11:56 PM

## 2015-02-20 NOTE — ED Notes (Signed)
Pt comes in with persistent diarrhea. He states this has last a couple hours. Pt was seen for the same last week. Pt has bradycardia, EMS heart rates in the 30's and 40's. Pt is alert and oriented and responding to staff. NAD noted. Pt is pale in color.

## 2015-02-20 NOTE — H&P (Signed)
[facility] History and Physical  [name] WUJ:[id number] DOB: 1935-05-05 DOA: 02/20/2015  Referring physician: ER, Dr. Manus Gunning PCP: Ezequiel Kayser, MD   Chief Complaint: Diarrhea  HPI: [name] is a 80 y.o. male  This is a 80 year old man who signed out AMA yesterday from [facility]. He had been admitted there with diarrhea but was found to have significant bradycardia and atrial fibrillation. He was seen by cardiology and electrophysiology, who had planned for elective pacemaker implantation. Unfortunately he signed out AMA yesterday. He returns back because he has had some abdominal discomfort and diarrhea. His appetite is poor. He denies any fever, vomiting, hematemesis. He denies any increased dyspnea or any chest pain. He does have a history of chronic congestive heart failure, systolic from ischemic cardio myopathy. His ejection fraction is known to be 20-25% on echocardiogram done in May 2014. He is now being admitted back for management of his problems.   Review of Systems:  Apart from symptoms above, all systems negative.  Past Medical History  Diagnosis Date  . Chest discomfort   . Low back pain   . Ischemic cardiomyopathy     EF 23% => improved to 40-45%;  Echo 5/14:  Mild LVH, EF 20-25%, mid to distal anteroseptal, anterior, inferior and apical AK, mild AI, mean AV gradient 4, moderate MR(Limited interrogation), moderate LAE, mild reduced RVSF, mild to moderate TR, peak RV-RA gradient 40  . Hearing loss   . SOB (shortness of breath)   . Joint pain   . VT (ventricular tachycardia)     s/p ICD explanted for infection.  The decision has been not to reimplant this given these difficulties.  . Anxiety   . Depression   . PAF (paroxysmal atrial fibrillation)   . Hyperlipidemia   . Gynecomastia   . BPH (benign prostatic hypertrophy)   . Diabetes mellitus   . Hypertension   . Degenerative joint disease   . GERD (gastroesophageal reflux disease)   .  CAD (coronary artery disease)   . Pneumonia    Past Surgical History  Procedure Laterality Date  . Cardiac defibrillator removal    . Coronary artery bypass graft  2000    X5  . Cardiac catheterization  12/25/2008    THE LEFT VENTRICLE WAS ENLARGED. THERE IS ANTERIOR APICAL AND INFERIOR APICAL AKINESIA. EF ESTIMATED 20%. NO MITRAL REGURGITATION.  . Cardiac catheterization  12/2008    REPEAT CATH, SHOWED BYPASS GRAFTS WERE PATENT  . US echocardiography  2007    EF 20 to 25%  . Ablation of dysrhythmic focus    . Pars plana vitrectomy  09/24/2011    Procedure: PARS PLANA VITRECTOMY WITH 25 GAUGE;  Surgeon: Alford Highland Rankin;  Location: [facility] OR;  Service: Ophthalmology;  Laterality: Left;  MEMBRANE PEEL, SILICONE OIL  . Cardioversion N/A 11/20/2012    Procedure: CARDIOVERSION;  Surgeon: Pricilla Riffle, MD;  Location: [facility] ENDOSCOPY;  Service: Cardiovascular;  Laterality: N/A;  . Cardioversion N/A 12/28/2012    Procedure: CARDIOVERSION;  Surgeon: Cassell Clement, MD;  Location: [facility] ENDOSCOPY;  Service: Cardiovascular;  Laterality: N/A;  . Cataract extraction, bilateral     Social History:  reports that he has never smoked. He has never used smokeless tobacco. He reports that he does not drink alcohol or use illicit drugs.  No Known Allergies   Family history: No family history of bradycardia.   Prior to Admission medications   Medication Sig Start Date End Date Taking? Authorizing Provider  ALPRAZolam (XANAX) 0.5 MG tablet Take 0.5 mg by mouth 3 (three) times daily as needed for anxiety.  02/10/15  Yes Historical Provider, MD  finasteride (PROSCAR) 5 MG tablet Take 5 mg by mouth daily.  02/02/15  Yes Historical Provider, MD  lactose free nutrition (BOOST) LIQD Take 237 mLs by mouth 3 (three) times daily between meals.   Yes Historical Provider, MD  levothyroxine (SYNTHROID, LEVOTHROID) 125 MCG tablet Take 125 mcg by mouth daily. 01/25/15  Yes Historical Provider, MD  losartan (COZAAR) 50 MG tablet Take  0.5 tablets (25 mg total) by mouth daily. 08/14/14  Yes Duke Salvia, MD  amiodarone (PACERONE) 200 MG tablet Take 1/2 tablet (100 mg total) by mouth 5 days a week. Patient taking differently: Take 100 mg by mouth daily.  05/03/14   Duke Salvia, MD  metroNIDAZOLE (FLAGYL) 500 MG tablet Take 500 mg by mouth 3 (three) times daily.  02/09/15   Historical Provider, MD  warfarin (COUMADIN) 5 MG tablet Take 1/2 tablet daily or as directed Patient not taking: Reported on 02/20/2015 07/01/14   Duke Salvia, MD   Physical Exam: Filed Vitals:   02/20/15 1600 02/20/15 1629 02/20/15 1630 02/20/15 1700  BP: 140/66 140/66 127/93 152/42  Pulse: 33 39 36 30  Temp:      TempSrc:      Resp: SpO2: 100% 100% 100% 100%    Wt Readings from Last 3 Encounters:  02/19/15 92.534 kg (204 lb)  05/03/14 94.802 kg (209 lb)  01/29/14 94.348 kg (208 lb)    General:  Appears calm and comfortable. He is alert and oriented. He is hemodynamic stable with blood pressures that are reasonable but he does have a bradycardia and atrial fibrillation. Eyes: PERRL, normal lids, irises & conjunctiva ENT: grossly normal hearing, lips & tongue Neck: no LAD, masses or thyromegaly Cardiovascular: Irregularly irregular, consistent with atrial fibrillation. I do not feel clinically he is currently in decompensated heart failure. Telemetry: Atrial fibrillation. Respiratory: CTA bilaterally, no w/r/r. Normal respiratory effort. Abdomen: soft, ntnd Skin: no rash or induration seen on limited exam Musculoskeletal: grossly normal tone BUE/BLE Psychiatric: grossly normal mood and affect, speech fluent and appropriate Neurologic: grossly non-focal.          Labs on Admission:  Basic Metabolic Panel:  Recent Labs Lab 02/14/15 2004  02/16/15 0404 02/17/15 0400 02/18/15 0256 02/19/15 0346 02/20/15 1245  NA  --   < > 137 136 137 137 138  K  --   < > 4.2 4.6 4.5 4.7 4.5  CL  --   < > 103 105 104 103 103  CO2   --   < > 22 20* GLUCOSE  --   < > 125* 129* 127* 126* 161*  BUN  --   < > 47* 55* 52* 45* 45*  CREATININE  --   < > 1.61* 1.75* 1.62* 1.54* 1.53*  CALCIUM  --   < > 8.5* 8.3* 8.6* 8.5* 8.8*  MG 3.2*  --   --   --   --   --   --   < > = values in this interval not displayed. Liver Function Tests:  Recent Labs Lab 02/14/15 1744 02/17/15 0400 02/20/15 1245  AST 80* 70* 31  ALT 107* 123* 66*  ALKPHOS 60 93 97  BILITOT 2.2* 2.3* 1.7*  PROT 6.2* 5.6* 6.4*  ALBUMIN 3.5 3.0* 3.4*    Recent  Labs Lab 02/14/15 1744  LIPASE 24   No results for input(s): AMMONIA in the last 168 hours. CBC:  Recent Labs Lab 02/14/15 1744 02/14/15 1803 02/17/15 0400 02/18/15 0256 02/19/15 0346 02/20/15 1245  WBC 9.3  --  7.8 8.5 6.3 9.0  NEUTROABS  --   --   --   --   --  6.7  HGB 10.7* 11.6* 10.9* 11.2* 11.0* 11.7*  HCT 33.8* 34.0* 34.1* 35.6* 35.4* 37.8*  MCV 94.2  --  91.9 92.7 91.9 93.8  PLT 279  --  272 272 287 341   Cardiac Enzymes:  Recent Labs Lab 02/20/15 1245  TROPONINI 0.03    BNP (last 3 results)  Recent Labs  02/17/15 0400  BNP 1599.2*    ProBNP (last 3 results) No results for input(s): PROBNP in the last 8760 hours.  CBG:  Recent Labs Lab 02/18/15 0737 02/18/15 1210 02/18/15 1644 02/18/15 2220 02/19/15 0732  GLUCAP 109* 159* 130* 160* 133*    Radiological Exams on Admission: Ct Abdomen Pelvis Wo Contrast  02/20/2015   CLINICAL DATA:  Persistent diarrhea.  Bradycardia.  EXAM: CT ABDOMEN AND PELVIS WITHOUT CONTRAST  TECHNIQUE: Multidetector CT imaging of the abdomen and pelvis was performed following the standard protocol without IV contrast.  COMPARISON:  08/09/2011 report, images not available  FINDINGS: Coronary artery calcifications with post CABG changes. Bilateral pleural effusions with compressive atelectasis. Pleural effusions are small to moderate in size. Negative for free air.  Unenhanced CT was performed per clinician order. Lack of IV  contrast limits sensitivity and specificity, especially for evaluation of abdominal/pelvic solid viscera.  There is a small amount of perihepatic and perisplenic ascites. The gallbladder is mildly distended with high-density material suggestive for sludge and cannot exclude gallstones. No gross abnormality to the spleen or pancreas. Normal appearance of the adrenal glands and kidneys. Negative for kidney stones or hydronephrosis. There is a small amount of fluid or edema around the duodenum. No significant lymphadenopathy. There is edema or stranding in the lower abdomen around the iliac vessels, particularly on the left side. This appears to be extraperitoneal fluid. There is diffuse subcutaneous edema. There is edema in the presacral region. No gross abnormality to the prostate or urinary bladder.  No gross abnormality to the small or large bowel. A normal appearing appendix.  Disc space narrowing and disease at L4-L5. No acute bone abnormality.  IMPRESSION: Bilateral pleural effusions with a small amount of intra-abdominal fluid and extensive subcutaneous edema. Findings are suggestive for anasarca.  The gallbladder is distended with high-density material that could represent sludge. Gallbladder could be further characterized with ultrasound.   Electronically Signed   By: Richarda Overlie M.D.   On: 02/20/2015 15:32   Ct Head Wo Contrast  02/20/2015   CLINICAL DATA:  LOC, diarrhea  EXAM: CT HEAD WITHOUT CONTRAST  TECHNIQUE: Contiguous axial images were obtained from the base of the skull through the vertex without intravenous contrast.  COMPARISON:  12/04/2009  FINDINGS: No skull fracture is noted. Paranasal sinuses and mastoid air cells are unremarkable. Atherosclerotic calcifications of carotid siphon again noted. Stable atrophy and chronic white matter disease. No acute cortical infarction. No mass lesion is noted on this unenhanced scan. Ventricular size is stable from prior exam.  IMPRESSION: No acute  intracranial abnormality. Stable atrophy and chronic white matter disease. No acute cortical infarction.   Electronically Signed   By: Natasha Mead M.D.   On: 02/20/2015 15:32   Dg Chest Portable  1 View  02/20/2015   CLINICAL DATA:  Persistent diarrhea, bradycardia, history diabetes mellitus, hypertension, paroxysmal atrial fibrillation, coronary artery disease post MI and and CABG, ischemic cardiomyopathy  EXAM: PORTABLE CHEST - 1 VIEW  COMPARISON:  Portable exam 1329 hours compared to 02/16/2015  FINDINGS: Enlargement of cardiac silhouette post CABG.  Slight pulmonary vascular congestion.  Bibasilar atelectasis.  Lungs otherwise clear.  No pleural effusion or pneumothorax.  IMPRESSION: Enlargement of cardiac silhouette with pulmonary vascular congestion post CABG.  Mild bibasilar atelectasis.   Electronically Signed   By: Ulyses Southward M.D.   On: 02/20/2015 14:00    EKG: Independently reviewed. Atrial fibrillation with slow ventricular rate as indicated above.  Assessment/Plan   1. Bradycardia and atrial fibrillation. He has ischemic cardiomyopathy with ejection fraction of 20-25%. He previously did have ICD which became infected and had to be explanted. Yesterday decision was made to go ahead with pacemaker by cardiology. He will be transferred to [facility] with this in mind. We will continue to hold warfarin. His amiodarone has been held also. I have written him for intravenous atropine should his ventricular rate drop below 30. 2. Diarrhea. This appears to be some sort of viral illness and we will monitor this closely. 3. Chronic kidney disease. His creatinine appears to be stable compared to the last few days. Monitor closely. 4. Hypertension. Stable. 5. Chronic systolic heart failure. Currently he appears to be compensated.  Further recommendations will depend on patient's hospital progress.   Code Status: Full code.  DVT Prophylaxis: SCDs.  Family Communication: I discussed the  plan with the patient at the bedside. He is agreeable now to have a pacemaker.   Disposition Plan: Depending on progress.   Time spent: 60 minutes.  Wilson Singer [facility] Pager [phone].

## 2015-02-21 ENCOUNTER — Encounter (HOSPITAL_COMMUNITY): Payer: Self-pay | Admitting: Nurse Practitioner

## 2015-02-21 ENCOUNTER — Encounter (HOSPITAL_COMMUNITY)
Admission: EM | Disposition: A | Discharge status: Home Health | Payer: PRIVATE HEALTH INSURANCE | Source: Home / Self Care | Attending: Internal Medicine

## 2015-02-21 DIAGNOSIS — I4891 Unspecified atrial fibrillation: Secondary | ICD-10-CM | POA: Insufficient documentation

## 2015-02-21 DIAGNOSIS — I442 Atrioventricular block, complete: Secondary | ICD-10-CM

## 2015-02-21 DIAGNOSIS — I482 Chronic atrial fibrillation: Secondary | ICD-10-CM

## 2015-02-21 DIAGNOSIS — A0472 Enterocolitis due to Clostridium difficile, not specified as recurrent: Secondary | ICD-10-CM | POA: Diagnosis present

## 2015-02-21 DIAGNOSIS — A047 Enterocolitis due to Clostridium difficile: Secondary | ICD-10-CM

## 2015-02-21 DIAGNOSIS — E039 Hypothyroidism, unspecified: Secondary | ICD-10-CM

## 2015-02-21 DIAGNOSIS — I255 Ischemic cardiomyopathy: Secondary | ICD-10-CM

## 2015-02-21 HISTORY — PX: EP IMPLANTABLE DEVICE: SHX172B

## 2015-02-21 LAB — CLOSTRIDIUM DIFFICILE BY PCR: CDIFFPCR: POSITIVE — AB

## 2015-02-21 LAB — GLUCOSE, CAPILLARY
GLUCOSE-CAPILLARY: 170 mg/dL — AB (ref 65–99)
Glucose-Capillary: 137 mg/dL — ABNORMAL HIGH (ref 65–99)
Glucose-Capillary: 141 mg/dL — ABNORMAL HIGH (ref 65–99)

## 2015-02-21 LAB — TROPONIN I: Troponin I: 0.05 ng/mL — ABNORMAL HIGH (ref <0.031)

## 2015-02-21 LAB — MAGNESIUM: Magnesium: 2.4 mg/dL (ref 1.7–2.4)

## 2015-02-21 SURGERY — PACEMAKER IMPLANT
Anesthesia: LOCAL

## 2015-02-21 MED ORDER — CEFAZOLIN SODIUM-DEXTROSE 2-3 GM-% IV SOLR
2.0000 g | INTRAVENOUS | Status: DC
  Filled 2015-02-21: qty 50

## 2015-02-21 MED ORDER — SODIUM CHLORIDE 0.9 % IV SOLN
INTRAVENOUS | Status: AC
  Administered 2015-02-21: 50 mL/h via INTRAVENOUS

## 2015-02-21 MED ORDER — ACETAMINOPHEN 325 MG PO TABS
325.0000 mg | ORAL_TABLET | ORAL | Status: DC | PRN
  Filled 2015-02-21: qty 2

## 2015-02-21 MED ORDER — LIDOCAINE HCL (PF) 1 % IJ SOLN
INTRAMUSCULAR | Status: DC | PRN
  Administered 2015-02-21: 20 mL via SUBCUTANEOUS

## 2015-02-21 MED ORDER — BUPIVACAINE HCL (PF) 0.25 % IJ SOLN
INTRAMUSCULAR | Status: AC
  Filled 2015-02-21: qty 30

## 2015-02-21 MED ORDER — DEXTROSE 5 % IV SOLN
2.0000 g | INTRAVENOUS | Status: DC | PRN
  Administered 2015-02-21: 2 g via INTRAVENOUS

## 2015-02-21 MED ORDER — SODIUM CHLORIDE 0.9 % IJ SOLN
3.0000 mL | Freq: Two times a day (BID) | INTRAMUSCULAR | Status: DC
  Administered 2015-02-21 – 2015-02-23 (×4): 3 mL via INTRAVENOUS

## 2015-02-21 MED ORDER — CEFAZOLIN SODIUM 1-5 GM-% IV SOLN
1.0000 g | Freq: Four times a day (QID) | INTRAVENOUS | Status: AC
  Administered 2015-02-21 – 2015-02-22 (×3): 1 g via INTRAVENOUS
  Filled 2015-02-21 (×3): qty 50

## 2015-02-21 MED ORDER — CHLORHEXIDINE GLUCONATE 4 % EX LIQD
60.0000 mL | Freq: Once | CUTANEOUS | Status: DC
Start: 2015-02-21 — End: 2015-02-21
  Filled 2015-02-21: qty 60

## 2015-02-21 MED ORDER — SODIUM CHLORIDE 0.9 % IV SOLN
INTRAVENOUS | Status: DC
  Administered 2015-02-21: 50 mL/h via INTRAVENOUS

## 2015-02-21 MED ORDER — TRAMADOL HCL 50 MG PO TABS: 50.0000 mg | ORAL_TABLET | Freq: Four times a day (QID) | ORAL | Status: DC | PRN

## 2015-02-21 MED ORDER — SODIUM CHLORIDE 0.9 % IV SOLN: 250.0000 mL | INTRAVENOUS | Status: DC | PRN

## 2015-02-21 MED ORDER — SACCHAROMYCES BOULARDII 250 MG PO CAPS
250.0000 mg | ORAL_CAPSULE | Freq: Two times a day (BID) | ORAL | Status: DC
  Administered 2015-02-21 – 2015-02-23 (×3): 250 mg via ORAL
  Filled 2015-02-21 (×7): qty 1

## 2015-02-21 MED ORDER — METRONIDAZOLE IN NACL 5-0.79 MG/ML-% IV SOLN
500.0000 mg | Freq: Three times a day (TID) | INTRAVENOUS | Status: DC
  Administered 2015-02-21 – 2015-02-22 (×3): 500 mg via INTRAVENOUS
  Filled 2015-02-21 (×5): qty 100

## 2015-02-21 MED ORDER — IOHEXOL 350 MG/ML SOLN
INTRAVENOUS | Status: DC | PRN
  Administered 2015-02-21: 15 mL via INTRAVENOUS

## 2015-02-21 MED ORDER — SODIUM CHLORIDE 0.9 % IR SOLN
80.0000 mg | Status: DC
  Filled 2015-02-21: qty 2

## 2015-02-21 MED ORDER — HEPARIN (PORCINE) IN NACL 2-0.9 UNIT/ML-% IJ SOLN
INTRAMUSCULAR | Status: AC
  Filled 2015-02-21: qty 500

## 2015-02-21 MED ORDER — SODIUM CHLORIDE 0.9 % IJ SOLN: 3.0000 mL | INTRAMUSCULAR | Status: DC | PRN

## 2015-02-21 MED ORDER — LIDOCAINE HCL (PF) 1 % IJ SOLN
INTRAMUSCULAR | Status: AC
  Filled 2015-02-21: qty 60

## 2015-02-21 MED ORDER — BUPIVACAINE HCL (PF) 0.25 % IJ SOLN
INTRAMUSCULAR | Status: DC | PRN
  Administered 2015-02-21: 10 mL

## 2015-02-21 MED ORDER — ONDANSETRON HCL 4 MG/2ML IJ SOLN: 4.0000 mg | Freq: Four times a day (QID) | INTRAMUSCULAR | Status: DC | PRN

## 2015-02-21 SURGICAL SUPPLY — 11 items
CABLE SURGICAL S-101-97-12 (CABLE) ×2 IMPLANT
CATH JOSEPH QUAD ALLRED 6F REP (CATHETERS) ×1 IMPLANT
LEAD CAPSURE NOVUS 5076-58CM (Lead) ×1 IMPLANT
NDL PERC 18GX7CM (NEEDLE) IMPLANT
NEEDLE PERC 18GX7CM (NEEDLE) ×2 IMPLANT
PAD DEFIB LIFELINK (PAD) ×2 IMPLANT
PPM ASSURITY SR PM1240 (Pacemaker) ×1 IMPLANT
SET INTRODUCER MICROPUNCT 5F (INTRODUCER) ×1 IMPLANT
SHEATH CLASSIC 7F (SHEATH) ×1 IMPLANT
SHEATH PINNACLE 6F 10CM (SHEATH) ×1 IMPLANT
TRAY PACEMAKER INSERTION (CUSTOM PROCEDURE TRAY) ×2 IMPLANT

## 2015-02-21 NOTE — Progress Notes (Signed)
[facility] PROGRESS NOTE  [name] ZOX:[id number] DOB: 02-Mar-1935 DOA: 02/20/2015 PCP: Ezequiel Kayser, MD  Assessment/Plan: #1 complete heart block Patient had presented with a symptomatic complete heart block. Patient had left AMA during last hospitalization a such a pacemaker was not placed. Patient has been seen in consultation with cardiology and patient for temporary wire prior to pacemaker placement. Continue to hold amiodarone and beta blocker. Per cardiology.  #2 C. difficile colitis Patient presented with abdominal pain and diarrhea. Stool studies positive for C. difficile PCR. Patient states is his first episode. Will place on IV Flagyl for now. Florastor. Supportive care. Pain management. Anti-emetics. Once cardiology procedure has been done for today will try patient on some clear liquids.  #3 permanent atrial fibrillation Patient's CHADSVASC score is 6. Patient currently bradycardic. Continue to hold amiodarone and beta blocker for rate control. INR is therapeutic at 2.35. Continue Coumadin per pharmacy.  #4 ischemic cardiomyopathy/CAD Patient denies any chest pain. Beta blocker on hold secondary to problem #1. Per cardiology.  #5 hypothyroidism Continue home dose Synthroid.  #6 CKD  Stable.   #7 HTN Cozaar  #8 Chronic systolic CHF Stable. Patient currently asymptomatic. Patient is not on a diuretic. His home med rec sheet. Continue ARB. Beta blocker on hold secondary to problem #1. Patient on Coumadin. Per cardiology.  #9 prophylaxis On Coumadin for DVT prophylaxis.   Code Status:  Full Family Communication: Updated patient of family at bedside. Disposition Plan: Home when medically stable and diarrhea has improved.   Consultants:  Cardiology: Dr. Johney Frame 02/21/2015  Procedures:  CT Head 02/20/2015  CT abdomen and pelvis 02/20/2015  Chest x-ray 02/20/2015  Antibiotics:  IV Flagyl 02/21/15  HPI/Subjective: Patient complaining of diffuse  abdominal pain. Patient states he's had 2 semi-loose stools. Patient states stools are heard in his backside. Patient denies any shortness of breath. No chest pain.  Objective: Filed Vitals:   02/21/15 0540  BP: 116/72  Pulse: 40  Temp: 97.8 F (36.6 C)  Resp: 20    Intake/Output Summary (Last 24 hours) at 02/21/15 1211 Last data filed at 02/21/15 1016  Gross per 24 hour  Intake    120 ml  Output    800 ml  Net   -680 ml   Filed Weights   02/20/15 2035 02/21/15 0540  Weight: 92.7 kg (204 lb 5.9 oz) 92.6 kg (204 lb 2.3 oz)    Exam:   General:  NAD  Cardiovascular: RRR  Respiratory: CTAB  Abdomen: Soft, mild diffuse tenderness to palpation, positive bowel sounds, no rebound, no guarding  Musculoskeletal: No clubbing cyanosis or edema.  Data Reviewed: Basic Metabolic Panel:  Recent Labs Lab 02/14/15 2004  02/16/15 0404 02/17/15 0400 02/18/15 0256 02/19/15 0346 02/20/15 1245  NA  --   < > 137 136 137 137 138  K  --   < > 4.2 4.6 4.5 4.7 4.5  CL  --   < > 103 105 104 103 103  CO2  --   < > 22 20* 24 27 26   GLUCOSE  --   < > 125* 129* 127* 126* 161*  BUN  --   < > 47* 55* 52* 45* 45*  CREATININE  --   < > 1.61* 1.75* 1.62* 1.54* 1.53*  CALCIUM  --   < > 8.5* 8.3* 8.6* 8.5* 8.8*  MG 3.2*  --   --   --   --   --   --   < > =  values in this interval not displayed. Liver Function Tests:  Recent Labs Lab 02/14/15 1744 02/17/15 0400 02/20/15 1245  AST 80* 70* 31  ALT 107* 123* 66*  ALKPHOS 60 93 97  BILITOT 2.2* 2.3* 1.7*  PROT 6.2* 5.6* 6.4*  ALBUMIN 3.5 3.0* 3.4*    Recent Labs Lab 02/14/15 1744  LIPASE 24   No results for input(s): AMMONIA in the last 168 hours. CBC:  Recent Labs Lab 02/14/15 1744 02/14/15 1803 02/17/15 0400 02/18/15 0256 02/19/15 0346 02/20/15 1245  WBC 9.3  --  7.8 8.5 6.3 9.0  NEUTROABS  --   --   --   --   --  6.7  HGB 10.7* 11.6* 10.9* 11.2* 11.0* 11.7*  HCT 33.8* 34.0* 34.1* 35.6* 35.4* 37.8*  MCV 94.2  --   91.9 92.7 91.9 93.8  PLT 279  --  272 272 287 341   Cardiac Enzymes:  Recent Labs Lab 02/20/15 1245 02/21/15 0015  TROPONINI 0.03 0.05*   BNP (last 3 results)  Recent Labs  02/17/15 0400  BNP 1599.2*    ProBNP (last 3 results) No results for input(s): PROBNP in the last 8760 hours.  CBG:  Recent Labs Lab 02/18/15 2220 02/19/15 0732 02/20/15 1922 02/20/15 2342 02/21/15 0704  GLUCAP 160* 133* 118* 136* 170*    Recent Results (from the past 240 hour(s))  Clostridium Difficile by PCR     Status: None   Collection Time: 02/14/15  8:15 PM  Result Value Ref Range Status   C difficile by pcr NEGATIVE NEGATIVE Final  MRSA PCR Screening     Status: None   Collection Time: 02/14/15 11:10 PM  Result Value Ref Range Status   MRSA by PCR NEGATIVE NEGATIVE Final    Comment:        The GeneXpert MRSA Assay (FDA approved for NASAL specimens only), is one component of a comprehensive MRSA colonization surveillance program. It is not intended to diagnose MRSA infection nor to guide or monitor treatment for MRSA infections.   Culture, Urine     Status: None   Collection Time: 02/16/15  5:00 PM  Result Value Ref Range Status   Specimen Description URINE, CLEAN CATCH  Final   Special Requests NONE  Final   Colony Count   Final    6,000 COLONIES/ML Performed at [facility]    Culture   Final    INSIGNIFICANT GROWTH Performed at [facility]    Report Status 02/17/2015 FINAL  Final  Clostridium Difficile by PCR     Status: Abnormal   Collection Time: 02/21/15 10:25 AM  Result Value Ref Range Status   C difficile by pcr POSITIVE (A) NEGATIVE Final    Comment: CRITICAL RESULT CALLED TO, READ BACK BY AND VERIFIED WITH: T. FAIRING RN 11:40 02/21/15 ([email or web address])      Studies: Ct Abdomen Pelvis Wo Contrast  02/20/2015   CLINICAL DATA:  Persistent diarrhea.  Bradycardia.  EXAM: CT ABDOMEN AND PELVIS WITHOUT CONTRAST  TECHNIQUE: Multidetector CT imaging of  the abdomen and pelvis was performed following the standard protocol without IV contrast.  COMPARISON:  08/09/2011 report, images not available  FINDINGS: Coronary artery calcifications with post CABG changes. Bilateral pleural effusions with compressive atelectasis. Pleural effusions are small to moderate in size. Negative for free air.  Unenhanced CT was performed per clinician order. Lack of IV contrast limits sensitivity and specificity, especially for evaluation of abdominal/pelvic solid viscera.  There is a small amount of  perihepatic and perisplenic ascites. The gallbladder is mildly distended with high-density material suggestive for sludge and cannot exclude gallstones. No gross abnormality to the spleen or pancreas. Normal appearance of the adrenal glands and kidneys. Negative for kidney stones or hydronephrosis. There is a small amount of fluid or edema around the duodenum. No significant lymphadenopathy. There is edema or stranding in the lower abdomen around the iliac vessels, particularly on the left side. This appears to be extraperitoneal fluid. There is diffuse subcutaneous edema. There is edema in the presacral region. No gross abnormality to the prostate or urinary bladder.  No gross abnormality to the small or large bowel. A normal appearing appendix.  Disc space narrowing and disease at L4-L5. No acute bone abnormality.  IMPRESSION: Bilateral pleural effusions with a small amount of intra-abdominal fluid and extensive subcutaneous edema. Findings are suggestive for anasarca.  The gallbladder is distended with high-density material that could represent sludge. Gallbladder could be further characterized with ultrasound.   Electronically Signed   By: Richarda Overlie M.D.   On: 02/20/2015 15:32   Ct Head Wo Contrast  02/20/2015   CLINICAL DATA:  LOC, diarrhea  EXAM: CT HEAD WITHOUT CONTRAST  TECHNIQUE: Contiguous axial images were obtained from the base of the skull through the vertex without  intravenous contrast.  COMPARISON:  12/04/2009  FINDINGS: No skull fracture is noted. Paranasal sinuses and mastoid air cells are unremarkable. Atherosclerotic calcifications of carotid siphon again noted. Stable atrophy and chronic white matter disease. No acute cortical infarction. No mass lesion is noted on this unenhanced scan. Ventricular size is stable from prior exam.  IMPRESSION: No acute intracranial abnormality. Stable atrophy and chronic white matter disease. No acute cortical infarction.   Electronically Signed   By: Natasha Mead M.D.   On: 02/20/2015 15:32   Dg Chest Portable 1 View  02/20/2015   CLINICAL DATA:  Persistent diarrhea, bradycardia, history diabetes mellitus, hypertension, paroxysmal atrial fibrillation, coronary artery disease post MI and and CABG, ischemic cardiomyopathy  EXAM: PORTABLE CHEST - 1 VIEW  COMPARISON:  Portable exam 1329 hours compared to 02/16/2015  FINDINGS: Enlargement of cardiac silhouette post CABG.  Slight pulmonary vascular congestion.  Bibasilar atelectasis.  Lungs otherwise clear.  No pleural effusion or pneumothorax.  IMPRESSION: Enlargement of cardiac silhouette with pulmonary vascular congestion post CABG.  Mild bibasilar atelectasis.   Electronically Signed   By: Ulyses Southward M.D.   On: 02/20/2015 14:00    Scheduled Meds: .  ceFAZolin (ANCEF) IV  2 g Intravenous To Cath  . chlorhexidine  60 mL Topical Once  . chlorhexidine  60 mL Topical Once  . finasteride  5 mg Oral Daily  . gentamicin irrigation  80 mg Irrigation On Call  . lactose free nutrition  237 mL Oral TID BM  . levothyroxine  125 mcg Oral QAC breakfast  . losartan  25 mg Oral Daily  . metronidazole  500 mg Intravenous Q8H  . sodium chloride  3 mL Intravenous Q12H  . sodium chloride  3 mL Intravenous Q12H   Continuous Infusions: . sodium chloride      Principal Problem:   Complete heart block Active Problems:   C. difficile colitis   Ischemic cardiomyopathy   Atrial  fibrillation   Bradycardia with 31 - 40 beats per minute   Diarrhea   DM (diabetes mellitus), type 2 with renal complications   CKD (chronic kidney disease), stage III   Hypothyroidism   Chronic systolic CHF (congestive heart failure)  Bradycardia    Time spent: 5 MINS    [facility] MD [facility] Pager [phone]. If 7PM-7AM, please contact night-coverage at [email or web address], password [facility] 02/21/2015, 12:11 PM  LOS: 1 day

## 2015-02-21 NOTE — Progress Notes (Signed)
Pt will not keep blood pressure cuff on for frequent vitals.  Educated pt on importance of getting vitals frequently and still will not keep on cuff.  Pt also keeps taking arm out of sling, RN has placed pt's arm back in sling and instructed pt to keep it still.

## 2015-02-21 NOTE — Progress Notes (Signed)
The patient had some changes in his QRS complex on the monitor.  An EKG was obtained and read Acute MI.  RR, TRH, and Cards were notified.  All three came to the bedside to assess the patient.  His VS are stable and remain unchanged at this time.  He is pale (baseline since arriving), but does not have any complaints of CP.

## 2015-02-21 NOTE — Care Management Note (Signed)
Case Management Note  Patient Details  Name: OAKLIN VIVIANI MRN: [id number] Date of Birth: 09-07-1935  Subjective/Objective:        Admitted with Bradycardia, for pacer insertion            Action/Plan: CM following for DCP  Expected Discharge Date:   possibly 02/23/2015               Expected Discharge Plan:   home  Status of Service:   In progress  Additional Comments:  Alena Bills [phone] 02/21/2015, 11:24 AM

## 2015-02-21 NOTE — Progress Notes (Signed)
Both rapid RR and the TRH NP were notified about the patient's low HR (upper 20s-mid 30s) and the mild bleeding from his penis upon his arrival to the unit.  RR came to assess the patient at the bedside.  His VS were stable and he was not complaining of any CP.

## 2015-02-21 NOTE — Progress Notes (Signed)
The patient's troponin came back elevated at 0.05.  Both the [facility] and Cardiology were notified at 0300.  The patient is not have any CP and his status has not changed.

## 2015-02-21 NOTE — Consult Note (Signed)
   [facility] Inpatient Consult   02/21/2015  [name] 12-04-1934 [id number] Patient evaluated for community based chronic disease management services with [facility] Care Management Program as a benefit of patient's Medicare Insurance. Spoke with patient at bedside to explain [facility] Care Management services.  Patient will receive post discharge follow up calls and will be evaluated for monthly home visits for assessments and disease process education. Patient states, "they said I am suppose to have surgery this evening and the heart doctor said I could probably go home tomorrow but you would have to call me after that."  Consent formed signed and with copy and folder given with contact information. Made Inpatient Case Manager aware that [facility] Care Management following. Of note, [facility] Care Management services does not replace or interfere with any services that are arranged by inpatient case management or social work.  For additional questions or referrals please contact:   Charlesetta Shanks, RN BSN CCM [facility]  [phone] business mobile phone

## 2015-02-21 NOTE — Progress Notes (Signed)
Shift event note:  Notified by RN regarding pt's persistent bradycardia and new EKG that has interpretation of "Acute MI". RR RN was paged and responded to bedside. Pt at one point c/o CP but then denied. HR in 30's w/ unsustained drops into the high 20's. RN has attempted to get in touch w/ cardiology to notify them of pt's arrival from [facility] but have been unable to contact on-call fellow. Dr Gala Romney has been notified by the [facility] and is in route to see pt.  At bedside pt noted resting in NAD. He is quite rude and an unwilling historian (to say the least). He makes several requests that everyone just leave him alone. He only wants to see his cardiologist. He is somewhat pale, skin otherwise warm and dry. BBS CTA. Abd is soft, non-distended w/ TTP at LUQ, LMQ and RUQ w/ hypoactive bs. Reviewed multiple EKG's and pt's current rhythm appears to be unchanged from previous EKG's over the last several days. HR-32 (SB), BP currently 155/54. At the time of my departure Dr Gala Romney had arrived to evaluate pt.   Leanne Chang, NP-C [facility] Pager [phone]

## 2015-02-21 NOTE — Consult Note (Signed)
 ELECTROPHYSIOLOGY CONSULT NOTE    Patient ID: [name] MRN: [id number], DOB/AGE: 11/09/1934 80 y.o.  Admit date: 02/20/2015 Date of Consult: 02/21/2015  Primary Physician: PERINI,MARK A, MD Primary Cardiologist: Hochrein Electrophysiologist: Klein  Reason for Consultation: bradycardia  HPI:  Azell F Denis is a 80 y.o. male with a complicated past medical history including ischemic cardiomyopathy, CAD (remote CABG), ventricular tachycardia (s/p ablation), prior CRTD implant/subcutaneous array with pocket infection s/p device system extraction, anxiety, depression, permanent atrial fibrillation (on Warfarin), hyperlipidemia, BPH. He was admitted earlier this week with complete heart block, acute on chronic renal failure, worsening liver function and left AMA.  He presented to [place] yesterday with recurrent abdominal pain and persistent bradycardia. He was transferred to [facility] for further evaluation.    He currently endorses abdominal pain, persistent diarrhea.  He denies fevers, chills, chest pain, shortness of breath.    Past Medical History  Diagnosis Date  . Ischemic cardiomyopathy     a. prior CRTD implantation with subcutaneous array b. s/p device system extraction 2/2 infection  . VT (ventricular tachycardia)     a. s/p ablation  . Anxiety   . Depression   . Permanent atrial fibrillation   . Hyperlipidemia   . Gynecomastia   . BPH (benign prostatic hypertrophy)   . Diabetes mellitus   . Hypertension   . Degenerative joint disease   . GERD (gastroesophageal reflux disease)   . CAD (coronary artery disease)     a. s/p CABG     Surgical History:  Past Surgical History  Procedure Laterality Date  . Cardiac defibrillator removal    . Coronary artery bypass graft  2000    X5  . Cardiac catheterization  12/25/2008    THE LEFT VENTRICLE WAS ENLARGED. THERE IS ANTERIOR APICAL AND INFERIOR APICAL AKINESIA. EF ESTIMATED 20%. NO MITRAL REGURGITATION.  . Cardiac  catheterization  12/2008    REPEAT CATH, SHOWED BYPASS GRAFTS WERE PATENT  . Us echocardiography  2007    EF 20 to 25%  . Ablation of dysrhythmic focus    . Pars plana vitrectomy  09/24/2011    Procedure: PARS PLANA VITRECTOMY WITH 25 GAUGE;  Surgeon: Gary A Rankin;  Location: [facility] OR;  Service: Ophthalmology;  Laterality: Left;  MEMBRANE PEEL, SILICONE OIL  . Cardioversion N/A 11/20/2012    Procedure: CARDIOVERSION;  Surgeon: Paula V Ross, MD;  Location: [facility] ENDOSCOPY;  Service: Cardiovascular;  Laterality: N/A;  . Cardioversion N/A 12/28/2012    Procedure: CARDIOVERSION;  Surgeon: Thomas Brackbill, MD;  Location: [facility] ENDOSCOPY;  Service: Cardiovascular;  Laterality: N/A;  . Cataract extraction, bilateral       Prescriptions prior to admission  Medication Sig Dispense Refill Last Dose  . ALPRAZolam (XANAX) 0.5 MG tablet Take 0.5 mg by mouth 3 (three) times daily as needed for anxiety.    Past Week at Unknown time  . finasteride (PROSCAR) 5 MG tablet Take 5 mg by mouth daily.    Past Week at Unknown time  . lactose free nutrition (BOOST) LIQD Take 237 mLs by mouth 3 (three) times daily between meals.   unknown  . levothyroxine (SYNTHROID, LEVOTHROID) 125 MCG tablet Take 125 mcg by mouth daily.   Past Week at Unknown time  . losartan (COZAAR) 50 MG tablet Take 0.5 tablets (25 mg total) by mouth daily. 30 tablet 6 Past Week at Unknown time  . amiodarone (PACERONE) 200 MG tablet Take 1/2 tablet (100 mg total) by mouth 5 days   a week. (Patient taking differently: Take 100 mg by mouth daily. ) 15 tablet 6 02/09/2015 at Unknown time  . metroNIDAZOLE (FLAGYL) 500 MG tablet Take 500 mg by mouth 3 (three) times daily.    02/09/2015  . warfarin (COUMADIN) 5 MG tablet Take 1/2 tablet daily or as directed (Patient not taking: Reported on 02/20/2015) 30 tablet 3 Not Taking at Unknown time    Inpatient Medications:  . finasteride  5 mg Oral Daily  . lactose free nutrition  237 mL Oral TID BM  . levothyroxine  125  mcg Oral QAC breakfast  . losartan  25 mg Oral Daily  . sodium chloride  3 mL Intravenous Q12H  . sodium chloride  3 mL Intravenous Q12H    Allergies: No Known Allergies  History   Social History  . Marital Status: Married    Spouse Name: N/A  . Number of Children: N/A  . Years of Education: N/A   Occupational History  . retired     machinest   Social History Main Topics  . Smoking status: Never Smoker   . Smokeless tobacco: Never Used  . Alcohol Use: No     Comment: hx of in younger years, not now  . Drug Use: No  . Sexual Activity: Not Currently   Other Topics Concern  . Not on file   Social History Narrative     Family History - pt unaware of early CAD  Review of Systems: All other systems reviewed and are otherwise negative except as noted above.  Physical Exam: Filed Vitals:   02/20/15 2213 02/20/15 2315 02/21/15 0135 02/21/15 0540  BP: 151/46 155/54 150/47 116/72  Pulse: 32 32 31 40  Temp:   97.8 F (36.6 C) 97.8 F (36.6 C)  TempSrc:   Oral Oral  Resp: 20  20 20  Height:      Weight:    204 lb 2.3 oz (92.6 kg)  SpO2: 97% 97% 94% 100%    GEN- The patient is chronically ill appearing, alert and oriented x 3 today.   HEENT: normocephalic, atraumatic; sclera clear, conjunctiva pink; hearing intact; oropharynx clear; neck supple, no JVP Lymph- no cervical lymphadenopathy Lungs- Clear to ausculation bilaterally, normal work of breathing.  No wheezes, rales, rhonchi Heart- Bradycardic regular rate and rhythm, no murmurs, rubs or gallops  GI- soft, non-tender, non-distended, bowel sounds present  Extremities- no clubbing, cyanosis, or edema  MS- no significant deformity or atrophy Skin- warm and dry, no rash or lesion Psych- euthymic mood, full affect Neuro- strength and sensation are intact  Labs:   Lab Results  Component Value Date   WBC 9.0 02/20/2015   HGB 11.7* 02/20/2015   HCT 37.8* 02/20/2015   MCV 93.8 02/20/2015   PLT 341 02/20/2015      Recent Labs Lab 02/20/15 1245  NA 138  K 4.5  CL 103  CO2 26  BUN 45*  CREATININE 1.53*  CALCIUM 8.8*  PROT 6.4*  BILITOT 1.7*  ALKPHOS 97  ALT 66*  AST 31  GLUCOSE 161*      Radiology/Studies: Ct Abdomen Pelvis Wo Contrast 02/20/2015   CLINICAL DATA:  Persistent diarrhea.  Bradycardia.  EXAM: CT ABDOMEN AND PELVIS WITHOUT CONTRAST  TECHNIQUE: Multidetector CT imaging of the abdomen and pelvis was performed following the standard protocol without IV contrast.  COMPARISON:  08/09/2011 report, images not available  FINDINGS: Coronary artery calcifications with post CABG changes. Bilateral pleural effusions with compressive atelectasis. Pleural effusions are   small to moderate in size. Negative for free air.  Unenhanced CT was performed per clinician order. Lack of IV contrast limits sensitivity and specificity, especially for evaluation of abdominal/pelvic solid viscera.  There is a small amount of perihepatic and perisplenic ascites. The gallbladder is mildly distended with high-density material suggestive for sludge and cannot exclude gallstones. No gross abnormality to the spleen or pancreas. Normal appearance of the adrenal glands and kidneys. Negative for kidney stones or hydronephrosis. There is a small amount of fluid or edema around the duodenum. No significant lymphadenopathy. There is edema or stranding in the lower abdomen around the iliac vessels, particularly on the left side. This appears to be extraperitoneal fluid. There is diffuse subcutaneous edema. There is edema in the presacral region. No gross abnormality to the prostate or urinary bladder.  No gross abnormality to the small or large bowel. A normal appearing appendix.  Disc space narrowing and disease at L4-L5. No acute bone abnormality.  IMPRESSION: Bilateral pleural effusions with a small amount of intra-abdominal fluid and extensive subcutaneous edema. Findings are suggestive for anasarca.  The gallbladder is distended  with high-density material that could represent sludge. Gallbladder could be further characterized with ultrasound.   Electronically Signed   By: Adam  Henn M.D.   On: 02/20/2015 15:32   Ct Head Wo Contrast 02/20/2015   CLINICAL DATA:  LOC, diarrhea  EXAM: CT HEAD WITHOUT CONt Head TRAST  TECHNIQUE: Contiguous axial images were obtained from the base of the skull through the vertex without intravenous contrast.  COMPARISON:  12/04/2009  FINDINGS: No skull fracture is noted. Paranasal sinuses and mastoid air cells are unremarkable. Atherosclerotic calcifications of carotid siphon again noted. Stable atrophy and chronic white matter disease. No acute cortical infarction. No mass lesion is noted on this unenhanced scan. Ventricular size is stable from prior exam.  IMPRESSION: No acute intracranial abnormality. Stable atrophy and chronic white matter disease. No acute cortical infarction.   Electronically Signed   By: Liviu  Pop M.D.   On: 02/20/2015 15:32   Dg Chest Portable 1 View 02/20/2015   CLINICAL DATA:  Persistent diarrhea, bradycardia, history diabetes mellitus, hypertension, paroxysmal atrial fibrillation, coronary artery disease post MI and and CABG, ischemic cardiomyopathy  EXAM: PORTABLE CHEST - 1 VIEW  COMPARISON:  Portable exam 1329 hours compared to 02/16/2015  FINDINGS: Enlargement of cardiac silhouette post CABG.  Slight pulmonary vascular congestion.  Bibasilar atelectasis.  Lungs otherwise clear.  No pleural effusion or pneumothorax.  IMPRESSION: Enlargement of cardiac silhouette with pulmonary vascular congestion post CABG.  Mild bibasilar atelectasis.   Electronically Signed   By: Mark  Boles M.D.   On: 02/20/2015 14:00   EKG: atrial fibrillation with complete heart block, ventricular rate 33  TELEMETRY: atrial fibrillation with complete heart block, ventricular rates 30's  DEVICE HISTORY: prior left sided CRTD with subcutaneous array and device infection s/p extraction with small part of  RV lead tip remaining  Assessment/Plan: 1.  Complete heart block The patient has symptomatic complete heart block.  Dr Klein discussed with the patient this morning and has again recommended pacemaker implantation.  The patient is willing to proceed.  With previous left sided device infection and explantation, may require right sided implant.  Dr Klein to discuss with Dr Rajah Lamba.  Pt placed on the board for later today.   2.  Permanent atrial fibrillation Continue to hold amiodarone and beta blocker Continue Warfarin for CHADS2VASC score of 6  3.  Ischemic cardiomyopathy/CAD No recent   ischemic symptoms The patient does not wish to pursue ICD re-implantation  4.  Chronic renal insufficiency Stable today   Signed, Amber Seiler, NP 02/21/2015 9:44 AM  I have seen, examined the patient, and reviewed the above assessment and plan.  Changes to above are made where necessary.  The patient is clinically very ill with multiple acute medical issues.  His prognosis is very poor and mortality is very high.  He has had a prior ICD which was extracted for infection and he was felt to not be a candidate for reimplantation despite prior VT and reduced EF.  He now presents with high grade AV block.  I discussed options of single chamber PPM vs palliative measures.  He would prefer pacing at this time. Given prior L sided system infection, we will plan R sided implantation.  I will place a temporary wire prior to the procedure. Risks, benefits, alternatives to pacemaker implantation were discussed in detail with the patient today. The patient understands that the risks include but are not limited to bleeding, infection, pneumothorax, perforation, tamponade, vascular damage, renal failure, MI, stroke, death,  and lead dislodgement and wishes to proceed. We will therefore schedule the procedure at the next available time.   Co Sign: Alyza Artiaga, MD 02/21/2015 2:49 PM   

## 2015-02-21 NOTE — Interval H&P Note (Signed)
History and Physical Interval Note:  02/21/2015 3:10 PM  [name]  has presented today for surgery, with the diagnosis of heart block  The various methods of treatment have been discussed with the patient and family. After consideration of risks, benefits and other options for treatment, the patient has consented to  Procedure(s): Pacemaker Implant (N/A) as a surgical intervention .  The patient's history has been reviewed, patient examined, no change in status, stable for surgery.  I have reviewed the patient's chart and labs.  Questions were answered to the patient's satisfaction.     Hillis Range

## 2015-02-21 NOTE — Progress Notes (Signed)
Orthopedic Tech Progress Note Patient Details:  [name] [birth year] [id number] Patient has sling on . Patient ID: JANZIEL PACH, male   DOB: [birth year], 80 y.o.   MRN: [id number]   Jennye Moccasin 02/21/2015, 5:45 PM

## 2015-02-21 NOTE — H&P (View-Only) (Signed)
ELECTROPHYSIOLOGY CONSULT NOTE    Patient ID: [name] MRN: [id number], DOB/AGE: Sep 21, 1935 80 y.o.  Admit date: 02/20/2015 Date of Consult: 02/21/2015  Primary Physician: Ezequiel Kayser, MD Primary Cardiologist: Hochrein Electrophysiologist: Graciela Husbands  Reason for Consultation: bradycardia  HPI:  [name] is a 80 y.o. male with a complicated past medical history including ischemic cardiomyopathy, CAD (remote CABG), ventricular tachycardia (s/p ablation), prior CRTD implant/subcutaneous array with pocket infection s/p device system extraction, anxiety, depression, permanent atrial fibrillation (on Warfarin), hyperlipidemia, BPH. He was admitted earlier this week with complete heart block, acute on chronic renal failure, worsening liver function and left AMA.  He presented to [facility] yesterday with recurrent abdominal pain and persistent bradycardia. He was transferred to [facility] for further evaluation.    He currently endorses abdominal pain, persistent diarrhea.  He denies fevers, chills, chest pain, shortness of breath.    Past Medical History  Diagnosis Date  . Ischemic cardiomyopathy     a. prior CRTD implantation with subcutaneous array b. s/p device system extraction 2/2 infection  . VT (ventricular tachycardia)     a. s/p ablation  . Anxiety   . Depression   . Permanent atrial fibrillation   . Hyperlipidemia   . Gynecomastia   . BPH (benign prostatic hypertrophy)   . Diabetes mellitus   . Hypertension   . Degenerative joint disease   . GERD (gastroesophageal reflux disease)   . CAD (coronary artery disease)     a. s/p CABG     Surgical History:  Past Surgical History  Procedure Laterality Date  . Cardiac defibrillator removal    . Coronary artery bypass graft  2000    X5  . Cardiac catheterization  12/25/2008    THE LEFT VENTRICLE WAS ENLARGED. THERE IS ANTERIOR APICAL AND INFERIOR APICAL AKINESIA. EF ESTIMATED 20%. NO MITRAL REGURGITATION.  . Cardiac  catheterization  12/2008    REPEAT CATH, SHOWED BYPASS GRAFTS WERE PATENT  . US echocardiography  2007    EF 20 to 25%  . Ablation of dysrhythmic focus    . Pars plana vitrectomy  09/24/2011    Procedure: PARS PLANA VITRECTOMY WITH 25 GAUGE;  Surgeon: Alford Highland Rankin;  Location: [facility] OR;  Service: Ophthalmology;  Laterality: Left;  MEMBRANE PEEL, SILICONE OIL  . Cardioversion N/A 11/20/2012    Procedure: CARDIOVERSION;  Surgeon: Pricilla Riffle, MD;  Location: [facility] ENDOSCOPY;  Service: Cardiovascular;  Laterality: N/A;  . Cardioversion N/A 12/28/2012    Procedure: CARDIOVERSION;  Surgeon: Cassell Clement, MD;  Location: [facility] ENDOSCOPY;  Service: Cardiovascular;  Laterality: N/A;  . Cataract extraction, bilateral       Prescriptions prior to admission  Medication Sig Dispense Refill Last Dose  . ALPRAZolam (XANAX) 0.5 MG tablet Take 0.5 mg by mouth 3 (three) times daily as needed for anxiety.    Past Week at Unknown time  . finasteride (PROSCAR) 5 MG tablet Take 5 mg by mouth daily.    Past Week at Unknown time  . lactose free nutrition (BOOST) LIQD Take 237 mLs by mouth 3 (three) times daily between meals.   unknown  . levothyroxine (SYNTHROID, LEVOTHROID) 125 MCG tablet Take 125 mcg by mouth daily.   Past Week at Unknown time  . losartan (COZAAR) 50 MG tablet Take 0.5 tablets (25 mg total) by mouth daily. 30 tablet 6 Past Week at Unknown time  . amiodarone (PACERONE) 200 MG tablet Take 1/2 tablet (100 mg total) by mouth 5 days  a week. (Patient taking differently: Take 100 mg by mouth daily. ) 15 tablet 6 02/09/2015 at Unknown time  . metroNIDAZOLE (FLAGYL) 500 MG tablet Take 500 mg by mouth 3 (three) times daily.    02/09/2015  . warfarin (COUMADIN) 5 MG tablet Take 1/2 tablet daily or as directed (Patient not taking: Reported on 02/20/2015) 30 tablet 3 Not Taking at Unknown time    Inpatient Medications:  . finasteride  5 mg Oral Daily  . lactose free nutrition  237 mL Oral TID BM  . levothyroxine  125  mcg Oral QAC breakfast  . losartan  25 mg Oral Daily  . sodium chloride  3 mL Intravenous Q12H  . sodium chloride  3 mL Intravenous Q12H    Allergies: No Known Allergies  History   Social History  . Marital Status: Married    Spouse Name: N/A  . Number of Children: N/A  . Years of Education: N/A   Occupational History  . retired     Therapist, sports   Social History Main Topics  . Smoking status: Never Smoker   . Smokeless tobacco: Never Used  . Alcohol Use: No     Comment: hx of in younger years, not now  . Drug Use: No  . Sexual Activity: Not Currently   Other Topics Concern  . Not on file   Social History Narrative     Family History - pt unaware of early CAD  Review of Systems: All other systems reviewed and are otherwise negative except as noted above.  Physical Exam: Filed Vitals:   02/20/15 2213 02/20/15 2315 02/21/15 0135 02/21/15 0540  BP: 151/46 155/54 150/47 116/72  Pulse: 32 32 31 40  Temp:   97.8 F (36.6 C) 97.8 F (36.6 C)  TempSrc:   Oral Oral  Resp: 20  20 20   Height:      Weight:    204 lb 2.3 oz (92.6 kg)  SpO2: 97% 97% 94% 100%    GEN- The patient is chronically ill appearing, alert and oriented x 3 today.   HEENT: normocephalic, atraumatic; sclera clear, conjunctiva pink; hearing intact; oropharynx clear; neck supple, no JVP Lymph- no cervical lymphadenopathy Lungs- Clear to ausculation bilaterally, normal work of breathing.  No wheezes, rales, rhonchi Heart- Bradycardic regular rate and rhythm, no murmurs, rubs or gallops  GI- soft, non-tender, non-distended, bowel sounds present  Extremities- no clubbing, cyanosis, or edema  MS- no significant deformity or atrophy Skin- warm and dry, no rash or lesion Psych- euthymic mood, full affect Neuro- strength and sensation are intact  Labs:   Lab Results  Component Value Date   WBC 9.0 02/20/2015   HGB 11.7* 02/20/2015   HCT 37.8* 02/20/2015   MCV 93.8 02/20/2015   PLT 341 02/20/2015      Recent Labs Lab 02/20/15 1245  NA 138  K 4.5  CL 103  CO2 26  BUN 45*  CREATININE 1.53*  CALCIUM 8.8*  PROT 6.4*  BILITOT 1.7*  ALKPHOS 97  ALT 66*  AST 31  GLUCOSE 161*      Radiology/Studies: Ct Abdomen Pelvis Wo Contrast 02/20/2015   CLINICAL DATA:  Persistent diarrhea.  Bradycardia.  EXAM: CT ABDOMEN AND PELVIS WITHOUT CONTRAST  TECHNIQUE: Multidetector CT imaging of the abdomen and pelvis was performed following the standard protocol without IV contrast.  COMPARISON:  08/09/2011 report, images not available  FINDINGS: Coronary artery calcifications with post CABG changes. Bilateral pleural effusions with compressive atelectasis. Pleural effusions are  small to moderate in size. Negative for free air.  Unenhanced CT was performed per clinician order. Lack of IV contrast limits sensitivity and specificity, especially for evaluation of abdominal/pelvic solid viscera.  There is a small amount of perihepatic and perisplenic ascites. The gallbladder is mildly distended with high-density material suggestive for sludge and cannot exclude gallstones. No gross abnormality to the spleen or pancreas. Normal appearance of the adrenal glands and kidneys. Negative for kidney stones or hydronephrosis. There is a small amount of fluid or edema around the duodenum. No significant lymphadenopathy. There is edema or stranding in the lower abdomen around the iliac vessels, particularly on the left side. This appears to be extraperitoneal fluid. There is diffuse subcutaneous edema. There is edema in the presacral region. No gross abnormality to the prostate or urinary bladder.  No gross abnormality to the small or large bowel. A normal appearing appendix.  Disc space narrowing and disease at L4-L5. No acute bone abnormality.  IMPRESSION: Bilateral pleural effusions with a small amount of intra-abdominal fluid and extensive subcutaneous edema. Findings are suggestive for anasarca.  The gallbladder is distended  with high-density material that could represent sludge. Gallbladder could be further characterized with ultrasound.   Electronically Signed   By: Richarda Overlie M.D.   On: 02/20/2015 15:32   Ct Head Wo Contrast 02/20/2015   CLINICAL DATA:  LOC, diarrhea  EXAM: CT HEAD WITHOUT CONt Head TRAST  TECHNIQUE: Contiguous axial images were obtained from the base of the skull through the vertex without intravenous contrast.  COMPARISON:  12/04/2009  FINDINGS: No skull fracture is noted. Paranasal sinuses and mastoid air cells are unremarkable. Atherosclerotic calcifications of carotid siphon again noted. Stable atrophy and chronic white matter disease. No acute cortical infarction. No mass lesion is noted on this unenhanced scan. Ventricular size is stable from prior exam.  IMPRESSION: No acute intracranial abnormality. Stable atrophy and chronic white matter disease. No acute cortical infarction.   Electronically Signed   By: Natasha Mead M.D.   On: 02/20/2015 15:32   Dg Chest Portable 1 View 02/20/2015   CLINICAL DATA:  Persistent diarrhea, bradycardia, history diabetes mellitus, hypertension, paroxysmal atrial fibrillation, coronary artery disease post MI and and CABG, ischemic cardiomyopathy  EXAM: PORTABLE CHEST - 1 VIEW  COMPARISON:  Portable exam 1329 hours compared to 02/16/2015  FINDINGS: Enlargement of cardiac silhouette post CABG.  Slight pulmonary vascular congestion.  Bibasilar atelectasis.  Lungs otherwise clear.  No pleural effusion or pneumothorax.  IMPRESSION: Enlargement of cardiac silhouette with pulmonary vascular congestion post CABG.  Mild bibasilar atelectasis.   Electronically Signed   By: Ulyses Southward M.D.   On: 02/20/2015 14:00   EKG: atrial fibrillation with complete heart block, ventricular rate 33  TELEMETRY: atrial fibrillation with complete heart block, ventricular rates 30's  DEVICE HISTORY: prior left sided CRTD with subcutaneous array and device infection s/p extraction with small part of  RV lead tip remaining  Assessment/Plan: 1.  Complete heart block The patient has symptomatic complete heart block.  Dr Graciela Husbands discussed with the patient this morning and has again recommended pacemaker implantation.  The patient is willing to proceed.  With previous left sided device infection and explantation, may require right sided implant.  Dr Graciela Husbands to discuss with Dr Johney Frame.  Pt placed on the board for later today.   2.  Permanent atrial fibrillation Continue to hold amiodarone and beta blocker Continue Warfarin for CHADS2VASC score of 6  3.  Ischemic cardiomyopathy/CAD No recent  ischemic symptoms The patient does not wish to pursue ICD re-implantation  4.  Chronic renal insufficiency Stable today   Signed, Gypsy Balsam, NP 02/21/2015 9:44 AM  I have seen, examined the patient, and reviewed the above assessment and plan.  Changes to above are made where necessary.  The patient is clinically very ill with multiple acute medical issues.  His prognosis is very poor and mortality is very high.  He has had a prior ICD which was extracted for infection and he was felt to not be a candidate for reimplantation despite prior VT and reduced EF.  He now presents with high grade AV block.  I discussed options of single chamber PPM vs palliative measures.  He would prefer pacing at this time. Given prior L sided system infection, we will plan R sided implantation.  I will place a temporary wire prior to the procedure. Risks, benefits, alternatives to pacemaker implantation were discussed in detail with the patient today. The patient understands that the risks include but are not limited to bleeding, infection, pneumothorax, perforation, tamponade, vascular damage, renal failure, MI, stroke, death,  and lead dislodgement and wishes to proceed. We will therefore schedule the procedure at the next available time.   Co Sign: Hillis Range, MD 02/21/2015 2:49 PM

## 2015-02-22 ENCOUNTER — Inpatient Hospital Stay (HOSPITAL_COMMUNITY): Payer: Medicare Other

## 2015-02-22 DIAGNOSIS — E1129 Type 2 diabetes mellitus with other diabetic kidney complication: Secondary | ICD-10-CM

## 2015-02-22 LAB — GLUCOSE, CAPILLARY
GLUCOSE-CAPILLARY: 113 mg/dL — AB (ref 65–99)
Glucose-Capillary: 113 mg/dL — ABNORMAL HIGH (ref 65–99)
Glucose-Capillary: 129 mg/dL — ABNORMAL HIGH (ref 65–99)

## 2015-02-22 LAB — COMPREHENSIVE METABOLIC PANEL
ALBUMIN: 2.8 g/dL — AB (ref 3.5–5.0)
ALT: 39 U/L (ref 17–63)
AST: 20 U/L (ref 15–41)
Alkaline Phosphatase: 76 U/L (ref 38–126)
Anion gap: 10 (ref 5–15)
BUN: 31 mg/dL — ABNORMAL HIGH (ref 6–20)
CALCIUM: 8.3 mg/dL — AB (ref 8.9–10.3)
CO2: 24 mmol/L (ref 22–32)
CREATININE: 1.32 mg/dL — AB (ref 0.61–1.24)
Chloride: 101 mmol/L (ref 101–111)
GFR calc Af Amer: 58 mL/min — ABNORMAL LOW (ref >60)
GFR calc non Af Amer: 50 mL/min — ABNORMAL LOW (ref >60)
GLUCOSE: 151 mg/dL — AB (ref 65–99)
Potassium: 4.4 mmol/L (ref 3.5–5.1)
Sodium: 135 mmol/L (ref 135–145)
TOTAL PROTEIN: 5.5 g/dL — AB (ref 6.5–8.1)
Total Bilirubin: 1.8 mg/dL — ABNORMAL HIGH (ref 0.3–1.2)

## 2015-02-22 LAB — CBC
HCT: 35.1 % — ABNORMAL LOW (ref 39.0–52.0)
Hemoglobin: 10.8 g/dL — ABNORMAL LOW (ref 13.0–17.0)
MCH: 27.9 pg (ref 26.0–34.0)
MCHC: 30.8 g/dL (ref 30.0–36.0)
MCV: 90.7 fL (ref 78.0–100.0)
PLATELETS: 240 10*3/uL (ref 150–400)
RBC: 3.87 MIL/uL — ABNORMAL LOW (ref 4.22–5.81)
RDW: 15.2 % (ref 11.5–15.5)
WBC: 9.2 10*3/uL (ref 4.0–10.5)

## 2015-02-22 LAB — PROTIME-INR
INR: 3.21 — ABNORMAL HIGH (ref 0.00–1.49)
Prothrombin Time: 32.2 seconds — ABNORMAL HIGH (ref 11.6–15.2)

## 2015-02-22 MED ORDER — LORAZEPAM 2 MG/ML IJ SOLN
0.5000 mg | Freq: Three times a day (TID) | INTRAMUSCULAR | Status: DC | PRN
  Administered 2015-02-22 – 2015-02-23 (×2): 0.5 mg via INTRAVENOUS
  Filled 2015-02-22 (×2): qty 1

## 2015-02-22 MED ORDER — WARFARIN - PHARMACIST DOSING INPATIENT: Freq: Every day | Status: DC

## 2015-02-22 MED ORDER — VANCOMYCIN 50 MG/ML ORAL SOLUTION
125.0000 mg | Freq: Four times a day (QID) | ORAL | Status: DC
  Administered 2015-02-22 – 2015-02-23 (×5): 125 mg via ORAL
  Filled 2015-02-22 (×8): qty 2.5

## 2015-02-22 NOTE — Progress Notes (Addendum)
ANTICOAGULATION CONSULT NOTE - Initial Consult  Pharmacy Consult for coumadin Indication: atrial fibrillation  No Known Allergies  Patient Measurements: Height: 5\' 9"  (175.3 cm) Weight: 202 lb 13.2 oz (92 kg) IBW/kg (Calculated) : 70.7 Heparin Dosing Weight:   Vital Signs: Temp: 98.1 F (36.7 C) (05/21 0649) Temp Source: Oral (05/21 0649) BP: 156/78 mmHg (05/21 0649) Pulse Rate: 59 (05/21 0649)  Labs:  Recent Labs  02/20/15 1245 02/21/15 0015 02/22/15 0900  HGB 11.7*  --  10.8*  HCT 37.8*  --  35.1*  PLT 341  --  240  LABPROT 25.5*  --  32.2*  INR 2.35*  --  3.21*  CREATININE 1.53*  --  1.32*  TROPONINI 0.03 0.05*  --     Estimated Creatinine Clearance: 52.5 mL/min (by C-G formula based on Cr of 1.32).   Medical History: Past Medical History  Diagnosis Date  . Ischemic cardiomyopathy     a. prior CRTD implantation with subcutaneous array b. s/p device system extraction 2/2 infection  . VT (ventricular tachycardia)     a. s/p ablation  . Anxiety   . Depression   . Permanent atrial fibrillation   . Hyperlipidemia   . Gynecomastia   . BPH (benign prostatic hypertrophy)   . Diabetes mellitus   . Hypertension   . Degenerative joint disease   . GERD (gastroesophageal reflux disease)   . CAD (coronary artery disease)     a. s/p CABG    Medications:  Scheduled:  .  ceFAZolin (ANCEF) IV  1 g Intravenous Q6H  . finasteride  5 mg Oral Daily  . lactose free nutrition  237 mL Oral TID BM  . levothyroxine  125 mcg Oral QAC breakfast  . losartan  25 mg Oral Daily  . metronidazole  500 mg Intravenous Q8H  . saccharomyces boulardii  250 mg Oral BID  . sodium chloride  3 mL Intravenous Q12H   Infusions:  . sodium chloride 50 mL/hr (02/21/15 1730)    Assessment: 80 yo who was admitted for CHB and was tx here to PPM. He has been on coumadin for PAF. Last dose of coumadin was on 5/18. His c.diff is also positive so he was placed on flagyl 5/20. His INR cont to  trend up. Going to ask MD if ok to change flagyl to PO vanc due to interactions.   PTA coumadin = 2.5mg  qday  Goal of Therapy:  INR 2-3 Monitor platelets by anticoagulation protocol: Yes   Plan:   No coumadin today Daily INR  Ulyses Southward, PharmD Pager: [phone] 02/22/2015 11:11 AM  Addendum  Change Flagyl to PO vanc  Ulyses Southward, PharmD Pager: [phone] 02/22/2015 11:31 AM

## 2015-02-22 NOTE — Progress Notes (Signed)
[facility] PROGRESS NOTE  [name] EPP:[id number] DOB: 07/12/1935 DOA: 02/20/2015 PCP: Ezequiel Kayser, MD  Assessment/Plan: #1 complete heart block Patient had presented with a symptomatic complete heart block. Patient had left AMA during last hospitalization as such a pacemaker was not placed. Patient has been seen in consultation with cardiology and patient had pacemaker placed yesterday 02/21/2015. Cardiology following.  #2 C. difficile colitis Patient presented with abdominal pain and diarrhea. Stool studies positive for C. difficile PCR. Patient denies any abdominal pain. Clinical improvement. Improved consistency stools per nursing tech. Will change IV Flagyl and oral vancomycin secondary to interaction with Coumadin. Continue Florastor. Supportive care. Pain management. Anti-emetics. Patient tolerating current diet.  #3 permanent atrial fibrillation Patient's CHADSVASC score is 6. Patient currently bradycardic. Continue to hold amiodarone and beta blocker for rate control. INR is supratherapeutic at 3.21. Continue Coumadin per pharmacy.  #4 ischemic cardiomyopathy/CAD Patient denies any chest pain. Beta blocker on hold secondary to problem #1. Per cardiology.  #5 hypothyroidism Continue home dose Synthroid.  #6 CKD  Stable.   #7 HTN Cozaar  #8 Chronic systolic CHF Stable. Patient currently asymptomatic. Patient is not on a diuretic. His home med rec sheet. Continue ARB. Beta blocker on hold secondary to problem #1. Patient on Coumadin. Per cardiology.  #9 prophylaxis On Coumadin for DVT prophylaxis.   Code Status:  Full Family Communication: Updated patient of family at bedside. Disposition Plan: Home when medically stable and diarrhea has improved, hopefully tomorrow.   Consultants:  Cardiology: Dr. Johney Frame 02/21/2015  Procedures:  CT Head 02/20/2015  CT abdomen and pelvis 02/20/2015  Chest x-ray 02/20/2015  Pacemaker implantation 02/21/2015 per Dr.  Johney Frame  Antibiotics:  IV Flagyl 02/21/15>>>>> 02/22/2015  Oral vancomycin 02/22/2015  HPI/Subjective: Patient denies any chest. No shortness of breath. No abdominal pain. Patient denies any nausea vomiting. Patient tolerating current diet. Patient denies any bleeding. Patient concerned about his pets at home. Per nurse tech patient's stool with improved consistency.  Objective: Filed Vitals:   02/22/15 0649  BP: 156/78  Pulse: 59  Temp: 98.1 F (36.7 C)  Resp: 20    Intake/Output Summary (Last 24 hours) at 02/22/15 1142 Last data filed at 02/22/15 0647  Gross per 24 hour  Intake 1107.83 ml  Output    250 ml  Net 857.83 ml   Filed Weights   02/20/15 2035 02/21/15 0540 02/22/15 0649  Weight: 92.7 kg (204 lb 5.9 oz) 92.6 kg (204 lb 2.3 oz) 92 kg (202 lb 13.2 oz)    Exam:   General:  NAD  Cardiovascular: RRR  Respiratory: CTAB  Abdomen: Soft, NTTP, positive bowel sounds, no rebound, no guarding  Musculoskeletal: No clubbing cyanosis or edema.  Data Reviewed: Basic Metabolic Panel:  Recent Labs Lab 02/17/15 0400 02/18/15 0256 02/19/15 0346 02/20/15 1245 02/21/15 1208 02/22/15 0900  NA 136 137 137 138  --  135  K 4.6 4.5 4.7 4.5  --  4.4  CL 105 104 103 103  --  101  CO2 20* --  24  GLUCOSE 129* 127* 126* 161*  --  151*  BUN 55* 52* 45* 45*  --  31*  CREATININE 1.75* 1.62* 1.54* 1.53*  --  1.32*  CALCIUM 8.3* 8.6* 8.5* 8.8*  --  8.3*  MG  --   --   --   --  2.4  --    Liver Function Tests:  Recent Labs Lab 02/17/15 0400 02/20/15 1245 02/22/15 0900  AST 70* 31 20  ALT 123* 66* 39  ALKPHOS 93 97 76  BILITOT 2.3* 1.7* 1.8*  PROT 5.6* 6.4* 5.5*  ALBUMIN 3.0* 3.4* 2.8*   No results for input(s): LIPASE, AMYLASE in the last 168 hours. No results for input(s): AMMONIA in the last 168 hours. CBC:  Recent Labs Lab 02/17/15 0400 02/18/15 0256 02/19/15 0346 02/20/15 1245 02/22/15 0900  WBC 7.8 8.5 6.3 9.0 9.2  NEUTROABS  --   --    --  6.7  --   HGB 10.9* 11.2* 11.0* 11.7* 10.8*  HCT 34.1* 35.6* 35.4* 37.8* 35.1*  MCV 91.9 92.7 91.9 93.8 90.7  PLT 272 272 287 341 240   Cardiac Enzymes:  Recent Labs Lab 02/20/15 1245 02/21/15 0015  TROPONINI 0.03 0.05*   BNP (last 3 results)  Recent Labs  02/17/15 0400  BNP 1599.2*    ProBNP (last 3 results) No results for input(s): PROBNP in the last 8760 hours.  CBG:  Recent Labs Lab 02/20/15 1922 02/20/15 2342 02/21/15 0704 02/21/15 1140 02/21/15 2120  GLUCAP 118* 136* 170* 137* 141*    Recent Results (from the past 240 hour(s))  Clostridium Difficile by PCR     Status: None   Collection Time: 02/14/15  8:15 PM  Result Value Ref Range Status   C difficile by pcr NEGATIVE NEGATIVE Final  MRSA PCR Screening     Status: None   Collection Time: 02/14/15 11:10 PM  Result Value Ref Range Status   MRSA by PCR NEGATIVE NEGATIVE Final    Comment:        The GeneXpert MRSA Assay (FDA approved for NASAL specimens only), is one component of a comprehensive MRSA colonization surveillance program. It is not intended to diagnose MRSA infection nor to guide or monitor treatment for MRSA infections.   Culture, Urine     Status: None   Collection Time: 02/16/15  5:00 PM  Result Value Ref Range Status   Specimen Description URINE, CLEAN CATCH  Final   Special Requests NONE  Final   Colony Count   Final    6,000 COLONIES/ML Performed at [facility]    Culture   Final    INSIGNIFICANT GROWTH Performed at [facility]    Report Status 02/17/2015 FINAL  Final  Clostridium Difficile by PCR     Status: Abnormal   Collection Time: 02/21/15 10:25 AM  Result Value Ref Range Status   C difficile by pcr POSITIVE (A) NEGATIVE Final    Comment: CRITICAL RESULT CALLED TO, READ BACK BY AND VERIFIED WITH: T. FAIRING RN 11:40 02/21/15 ([email or web address])      Studies: Ct Abdomen Pelvis Wo Contrast  02/20/2015   CLINICAL DATA:  Persistent diarrhea.   Bradycardia.  EXAM: CT ABDOMEN AND PELVIS WITHOUT CONTRAST  TECHNIQUE: Multidetector CT imaging of the abdomen and pelvis was performed following the standard protocol without IV contrast.  COMPARISON:  08/09/2011 report, images not available  FINDINGS: Coronary artery calcifications with post CABG changes. Bilateral pleural effusions with compressive atelectasis. Pleural effusions are small to moderate in size. Negative for free air.  Unenhanced CT was performed per clinician order. Lack of IV contrast limits sensitivity and specificity, especially for evaluation of abdominal/pelvic solid viscera.  There is a small amount of perihepatic and perisplenic ascites. The gallbladder is mildly distended with high-density material suggestive for sludge and cannot exclude gallstones. No gross abnormality to the spleen or pancreas. Normal appearance of the adrenal glands and kidneys. Negative  for kidney stones or hydronephrosis. There is a small amount of fluid or edema around the duodenum. No significant lymphadenopathy. There is edema or stranding in the lower abdomen around the iliac vessels, particularly on the left side. This appears to be extraperitoneal fluid. There is diffuse subcutaneous edema. There is edema in the presacral region. No gross abnormality to the prostate or urinary bladder.  No gross abnormality to the small or large bowel. A normal appearing appendix.  Disc space narrowing and disease at L4-L5. No acute bone abnormality.  IMPRESSION: Bilateral pleural effusions with a small amount of intra-abdominal fluid and extensive subcutaneous edema. Findings are suggestive for anasarca.  The gallbladder is distended with high-density material that could represent sludge. Gallbladder could be further characterized with ultrasound.   Electronically Signed   By: Richarda Overlie M.D.   On: 02/20/2015 15:32   Ct Head Wo Contrast  02/20/2015   CLINICAL DATA:  LOC, diarrhea  EXAM: CT HEAD WITHOUT CONTRAST  TECHNIQUE:  Contiguous axial images were obtained from the base of the skull through the vertex without intravenous contrast.  COMPARISON:  12/04/2009  FINDINGS: No skull fracture is noted. Paranasal sinuses and mastoid air cells are unremarkable. Atherosclerotic calcifications of carotid siphon again noted. Stable atrophy and chronic white matter disease. No acute cortical infarction. No mass lesion is noted on this unenhanced scan. Ventricular size is stable from prior exam.  IMPRESSION: No acute intracranial abnormality. Stable atrophy and chronic white matter disease. No acute cortical infarction.   Electronically Signed   By: Natasha Mead M.D.   On: 02/20/2015 15:32   Dg Chest Portable 1 View  02/20/2015   CLINICAL DATA:  Persistent diarrhea, bradycardia, history diabetes mellitus, hypertension, paroxysmal atrial fibrillation, coronary artery disease post MI and and CABG, ischemic cardiomyopathy  EXAM: PORTABLE CHEST - 1 VIEW  COMPARISON:  Portable exam 1329 hours compared to 02/16/2015  FINDINGS: Enlargement of cardiac silhouette post CABG.  Slight pulmonary vascular congestion.  Bibasilar atelectasis.  Lungs otherwise clear.  No pleural effusion or pneumothorax.  IMPRESSION: Enlargement of cardiac silhouette with pulmonary vascular congestion post CABG.  Mild bibasilar atelectasis.   Electronically Signed   By: Ulyses Southward M.D.   On: 02/20/2015 14:00    Scheduled Meds: .  ceFAZolin (ANCEF) IV  1 g Intravenous Q6H  . finasteride  5 mg Oral Daily  . lactose free nutrition  237 mL Oral TID BM  . levothyroxine  125 mcg Oral QAC breakfast  . losartan  25 mg Oral Daily  . saccharomyces boulardii  250 mg Oral BID  . sodium chloride  3 mL Intravenous Q12H  . vancomycin  125 mg Oral QID  . Warfarin - Pharmacist Dosing Inpatient   Does not apply q1800   Continuous Infusions: . sodium chloride 50 mL/hr (02/21/15 1730)    Principal Problem:   Complete heart block Active Problems:   C. difficile colitis    Ischemic cardiomyopathy   Atrial fibrillation   Bradycardia with 31 - 40 beats per minute   Diarrhea   DM (diabetes mellitus), type 2 with renal complications   CKD (chronic kidney disease), stage III   Hypothyroidism   Chronic systolic CHF (congestive heart failure)   Bradycardia   Atrial fibrillation, unspecified    Time spent: 35 MINS    [facility] MD [facility] Pager [phone]. If 7PM-7AM, please contact night-coverage at [email or web address], password [facility] 02/22/2015, 11:42 AM  LOS: 2 days

## 2015-02-22 NOTE — Progress Notes (Signed)
SUBJECTIVE: The patient is doing well today.  He feels well and wants to go home.  He has 2 birds at home that he is very worried about and wants to get back to...  At this time, he denies chest pain, shortness of breath, or any new concerns.   . finasteride  5 mg Oral Daily  . lactose free nutrition  237 mL Oral TID BM  . levothyroxine  125 mcg Oral QAC breakfast  . losartan  25 mg Oral Daily  . saccharomyces boulardii  250 mg Oral BID  . sodium chloride  3 mL Intravenous Q12H  . vancomycin  125 mg Oral QID  . Warfarin - Pharmacist Dosing Inpatient   Does not apply q1800   . sodium chloride 50 mL/hr (02/21/15 1730)    OBJECTIVE: Physical Exam: Filed Vitals:   02/21/15 1716 02/21/15 2150 02/22/15 0243 02/22/15 0649  BP: 155/70 153/77 158/76 156/78  Pulse: 66 63 65 59  Temp:  98.3 F (36.8 C) 98.2 F (36.8 C) 98.1 F (36.7 C)  TempSrc:  Oral Oral Oral  Resp:  Height:      Weight:    202 lb 13.2 oz (92 kg)  SpO2: 92% 93% 86% 89%    Intake/Output Summary (Last 24 hours) at 02/22/15 1232 Last data filed at 02/22/15 8119  Gross per 24 hour  Intake 1107.83 ml  Output    250 ml  Net 857.83 ml    Telemetry reveals afib with V pacing  GEN- The patient is well appearing, alert and oriented x 3 today.   Head- normocephalic, atraumatic Eyes-  Sclera clear, conjunctiva pink Ears- hearing intact Oropharynx- clear Neck- supple,   Lungs- Clear to ausculation bilaterally, normal work of breathing Heart- Regular rate and rhythm (paced) GI- soft, NT, ND, + BS Extremities- no clubbing, cyanosis, + dependant edema Skin- no rash or lesion Psych- euthymic mood, full affect Neuro- stable tremor  LABS: Basic Metabolic Panel:  Recent Labs  14/78/29 1245 02/21/15 1208 02/22/15 0900  NA 138  --  135  K 4.5  --  4.4  CL 103  --  101  CO2 26  --  24  GLUCOSE 161*  --  151*  BUN 45*  --  31*  CREATININE 1.53*  --  1.32*  CALCIUM 8.8*  --  8.3*  MG  --  2.4  --      Liver Function Tests:  Recent Labs  02/20/15 1245 02/22/15 0900  AST 31 20  ALT 66* 39  ALKPHOS 97 76  BILITOT 1.7* 1.8*  PROT 6.4* 5.5*  ALBUMIN 3.4* 2.8*   No results for input(s): LIPASE, AMYLASE in the last 72 hours. CBC:  Recent Labs  02/20/15 1245 02/22/15 0900  WBC 9.0 9.2  NEUTROABS 6.7  --   HGB 11.7* 10.8*  HCT 37.8* 35.1*  MCV 93.8 90.7  PLT 341 240   Cardiac Enzymes:  Recent Labs  02/20/15 1245 02/21/15 0015  TROPONINI 0.03 0.05*    ASSESSMENT AND PLAN:  Principal Problem:   Complete heart block Active Problems:   Ischemic cardiomyopathy   Atrial fibrillation   Bradycardia with 31 - 40 beats per minute   Diarrhea   DM (diabetes mellitus), type 2 with renal complications   CKD (chronic kidney disease), stage III   Hypothyroidism   Chronic systolic CHF (congestive heart failure)   Bradycardia   C. difficile colitis   Atrial fibrillation, unspecified  1.  Complete heart block Doing well s/p PPM  CXR reveals stable lead, no ptx,  Device interrogation is reviewed and normal this am I would anticipate that his clinical picture should improve a little with better hemodynamics Routine wound care and follow-up  2. Renal failure improving  3. Cdiff Per primary team  Electrophysiology team to see as needed while here.  OK to discharge from my standpoint.   Please call with questions.   Hillis Range, MD 02/22/2015 12:32 PM

## 2015-02-22 NOTE — Progress Notes (Deleted)
Please schedule wound check with Amber in 7-10 days Follow-up with me in [place] in 3 months

## 2015-02-23 DIAGNOSIS — E1122 Type 2 diabetes mellitus with diabetic chronic kidney disease: Secondary | ICD-10-CM

## 2015-02-23 DIAGNOSIS — N189 Chronic kidney disease, unspecified: Secondary | ICD-10-CM

## 2015-02-23 DIAGNOSIS — Z7901 Long term (current) use of anticoagulants: Secondary | ICD-10-CM

## 2015-02-23 DIAGNOSIS — N183 Chronic kidney disease, stage 3 (moderate): Secondary | ICD-10-CM

## 2015-02-23 LAB — CBC
HCT: 33.4 % — ABNORMAL LOW (ref 39.0–52.0)
HEMOGLOBIN: 10.8 g/dL — AB (ref 13.0–17.0)
MCH: 29.7 pg (ref 26.0–34.0)
MCHC: 32.3 g/dL (ref 30.0–36.0)
MCV: 91.8 fL (ref 78.0–100.0)
Platelets: 192 10*3/uL (ref 150–400)
RBC: 3.64 MIL/uL — ABNORMAL LOW (ref 4.22–5.81)
RDW: 15.3 % (ref 11.5–15.5)
WBC: 7.4 10*3/uL (ref 4.0–10.5)

## 2015-02-23 LAB — BASIC METABOLIC PANEL
Anion gap: 6 (ref 5–15)
BUN: 25 mg/dL — ABNORMAL HIGH (ref 6–20)
CO2: 28 mmol/L (ref 22–32)
CREATININE: 1.14 mg/dL (ref 0.61–1.24)
Calcium: 8.4 mg/dL — ABNORMAL LOW (ref 8.9–10.3)
Chloride: 103 mmol/L (ref 101–111)
GFR calc Af Amer: >60 mL/min (ref >60)
Glucose, Bld: 104 mg/dL — ABNORMAL HIGH (ref 65–99)
Potassium: 4.7 mmol/L (ref 3.5–5.1)
Sodium: 137 mmol/L (ref 135–145)

## 2015-02-23 LAB — PROTIME-INR
INR: 3.09 — AB (ref 0.00–1.49)
Prothrombin Time: 31.3 seconds — ABNORMAL HIGH (ref 11.6–15.2)

## 2015-02-23 MED ORDER — SACCHAROMYCES BOULARDII 250 MG PO CAPS: 250.0000 mg | ORAL_CAPSULE | Freq: Two times a day (BID) | ORAL | Status: AC

## 2015-02-23 MED ORDER — VANCOMYCIN 50 MG/ML ORAL SOLUTION: 125.0000 mg | Freq: Four times a day (QID) | ORAL | Status: DC

## 2015-02-23 MED ORDER — WARFARIN SODIUM 1 MG PO TABS: 1.0000 mg | ORAL_TABLET | Freq: Every day | ORAL | Status: DC

## 2015-02-23 NOTE — Evaluation (Addendum)
Occupational Therapy Evaluation Patient Details Name: [name] MRN: [id number] DOB: January 29, 1935 Today's Date: 02/23/2015    History of Present Illness Pt is a 80 y.o. Male with PMH of ischemic cardiomyopathy,  VT, DM, a-fib, CAD s/p CABG x5, cardioversion who was recently admitted 5/13 with bradycardia and diarrhea and was planned to d/c to SNF, however left AMA on 5/18. Pt readmitted 02/20/15 with diarrhea.   Clinical Impression   PTA pt had recently d/c home and reports he was independent with ADLs (however I question this). Pt provided conflicting information and appears to be mildly confused. Pt is perseverating on getting home to his animals. Pt states he will refuse SNF, but am concerned about his fall risk. If pt declines SNF, he will need HHOT and HHaide to assist at home. Pt required min A to balance during ambulation due to his shuffling gait. Pt will benefit from acute OT to progress to Supervision level. O2 sats ranged from 78-94% during OT session on RA.    Follow Up Recommendations  Home health OT;Supervision/Assistance - 24 hour;Other (comment) (HHaide; recommend SNF but pt states he is going home)    Equipment Recommendations  3 in 1 bedside comode;Other (comment) (RW )    Recommendations for Other Services       Precautions / Restrictions Precautions Precautions: Fall Restrictions Weight Bearing Restrictions: No      Mobility Bed Mobility Overal bed mobility: Needs Assistance Bed Mobility: Supine to Sit     Supine to sit: Supervision     General bed mobility comments: Supervision to sit EOB. No physical assist needed  Transfers Overall transfer level: Needs assistance Equipment used: Rolling walker (2 wheeled) Transfers: Sit to/from Stand Sit to Stand: Min guard         General transfer comment: Min guard to stand and has good hand placement. From EOB and from recliner x3.          ADL Overall ADL's : Needs  assistance/impaired Eating/Feeding: Independent;Sitting   Grooming: Set up;Sitting   Upper Body Bathing: Set up;Sitting   Lower Body Bathing: Min guard;Sit to/from stand   Upper Body Dressing : Set up;Sitting   Lower Body Dressing: Min guard;Sit to/from stand   Toilet Transfer: Minimal assistance;Ambulation;RW Toilet Transfer Details (indicate cue type and reason): min a to steady Toileting- Architect and Hygiene: Min guard;Sit to/from stand       Functional mobility during ADLs: Minimal assistance;Rolling walker General ADL Comments: Pt able to perform sit<>stand with min guard but has shuffling gait and requires min A to steady. Feel that pt is a high fall risk. He displays decreased safety awareness and is perseverating on getting home to feed his birds. Pt providing conflicting information during session.      Vision Additional Comments: No change from baseline          Pertinent Vitals/Pain Pain Assessment: No/denies pain     Hand Dominance     Extremity/Trunk Assessment Upper Extremity Assessment Upper Extremity Assessment: Generalized weakness   Lower Extremity Assessment Lower Extremity Assessment: Generalized weakness   Cervical / Trunk Assessment Cervical / Trunk Assessment: Kyphotic   Communication Communication Communication: No difficulties   Cognition Arousal/Alertness: Awake/alert Behavior During Therapy: Agitated Overall Cognitive Status: Impaired/Different from baseline Area of Impairment: Orientation;Attention;Memory;Following commands;Safety/judgement;Awareness;Problem solving Orientation Level: Disoriented to;Place;Time;Situation Current Attention Level: Focused Memory: Decreased short-term memory Following Commands: Follows one step commands inconsistently;Follows one step commands with increased time Safety/Judgement: Decreased awareness of safety;Decreased awareness of deficits Awareness:  Intellectual Problem Solving: Slow  processing                Home Living Family/patient expects to be discharged to:: Private residence Living Arrangements: Alone (spouse currently at [facility]) Available Help at Discharge: Other (Comment) (daughter lives in [place] or [place] (unclear)) Type of Home: House Home Access: Stairs to enter [employer] of Steps: 2   Home Layout: One level               Home Equipment: Cane - single point   Additional Comments: Pt not giving clear answers. Reports his daughter lives in [place], then stated she lives in [place]. Became irritated when OT pointed this out. Then he stated that the doctor who recently left his room was his son he adopted. When OT clarified, pt angrily stated "No, he's not my son. You aren't understanding me." Wife in SNF due to hip fx      Prior Functioning/Environment Level of Independence: Independent             OT Diagnosis: Generalized weakness;Acute pain;Cognitive deficits   OT Problem List: Decreased strength;Decreased range of motion;Decreased activity tolerance;Impaired balance (sitting and/or standing);Decreased safety awareness;Decreased knowledge of use of DME or AE;Decreased knowledge of precautions   OT Treatment/Interventions: Self-care/ADL training;Therapeutic exercise;Energy conservation;DME and/or AE instruction;Therapeutic activities;Patient/family education;Balance training    OT Goals(Current goals can be found in the care plan section) Acute Rehab OT Goals Patient Stated Goal: To get out of here OT Goal Formulation: With patient Time For Goal Achievement: 03/09/15 Potential to Achieve Goals: Good  OT Frequency: Min 2X/week   Barriers to D/C: Decreased caregiver support             End of Session Equipment Utilized During Treatment: Gait belt;Rolling walker Nurse Communication: Mobility status  Activity Tolerance: Patient tolerated treatment well Patient left: in chair;with call bell/phone within reach    Time: 1106-1127 OT Time Calculation (min): 21 min Charges:  OT General Charges $OT Visit: 1 Procedure OT Evaluation $Initial OT Evaluation Tier I: 1 Procedure G-Codes:    Rae Lips Mar 17, 2015, 1:18 PM  Carney Living, OTR/L Occupational Therapist [phone] (pager)

## 2015-02-23 NOTE — Progress Notes (Signed)
ANTICOAGULATION CONSULT NOTE - Initial Consult  Pharmacy Consult for coumadin Indication: atrial fibrillation  No Known Allergies  Patient Measurements: Height: 5\' 9"  (175.3 cm) Weight: 203 lb 4.2 oz (92.2 kg) IBW/kg (Calculated) : 70.7 Heparin Dosing Weight:   Vital Signs: Temp: 97.6 F (36.4 C) (05/22 0605) Temp Source: Oral (05/22 0605) BP: 149/60 mmHg (05/22 0605) Pulse Rate: 69 (05/22 0605)  Labs:  Recent Labs  02/20/15 1245 02/21/15 0015 02/22/15 0900 02/23/15 0425  HGB 11.7*  --  10.8* 10.8*  HCT 37.8*  --  35.1* 33.4*  PLT 341  --  240 192  LABPROT 25.5*  --  32.2* 31.3*  INR 2.35*  --  3.21* 3.09*  CREATININE 1.53*  --  1.32* 1.14  TROPONINI 0.03 0.05*  --   --     Estimated Creatinine Clearance: 60.9 mL/min (by C-G formula based on Cr of 1.14).   Medical History: Past Medical History  Diagnosis Date  . Ischemic cardiomyopathy     a. prior CRTD implantation with subcutaneous array b. s/p device system extraction 2/2 infection  . VT (ventricular tachycardia)     a. s/p ablation  . Anxiety   . Depression   . Permanent atrial fibrillation   . Hyperlipidemia   . Gynecomastia   . BPH (benign prostatic hypertrophy)   . Diabetes mellitus   . Hypertension   . Degenerative joint disease   . GERD (gastroesophageal reflux disease)   . CAD (coronary artery disease)     a. s/p CABG    Medications:  Scheduled:  . finasteride  5 mg Oral Daily  . lactose free nutrition  237 mL Oral TID BM  . levothyroxine  125 mcg Oral QAC breakfast  . losartan  25 mg Oral Daily  . saccharomyces boulardii  250 mg Oral BID  . sodium chloride  3 mL Intravenous Q12H  . vancomycin  125 mg Oral QID  . Warfarin - Pharmacist Dosing Inpatient   Does not apply q1800   Infusions:     Assessment: 80 yo who was admitted for CHB and was tx here to PPM. He has been on coumadin for PAF. Last dose of coumadin was on 5/18. His c.diff is also positive so he was placed on flagyl  5/20. His INR down slightly but still above goal. He has not had any coumadin since 5/16. He did get 1mg  vit k on 5/18.   PTA coumadin = 2.5mg  qday  Goal of Therapy:  INR 2-3 Monitor platelets by anticoagulation protocol: Yes   Plan:   No coumadin today Cont daily INR If send home, coumadin 1mg  PO qday start 5/24 and check INR next week to adjust dose  Ulyses Southward, PharmD Pager: [phone] 02/23/2015 9:02 AM

## 2015-02-23 NOTE — Discharge Summary (Signed)
Physician Discharge Summary  [name] ZOX:[id number] DOB: October 16, 1934 DOA: 02/20/2015  PCP: Ezequiel Kayser, MD  Admit date: 02/20/2015 Discharge date: 02/23/2015  Time spent: 65 minutes  Recommendations for Outpatient Follow-up:  1. Follow-up with Rodrigo Ran A, MD in 1 week. On follow-up patient needs a basic metabolic profile done to follow-up on electrolytes and renal function. Patient will need a magnesium level checked. Patient's C. difficile colitis will need to be reassessed at that time. 2. Patient is to follow-up with Dr. Graciela Husbands for hospital follow-up. Patient will also follow-up with Dr. Graciela Husbands for wound check in 1 week. 3. Patient is to follow-up at the Coumadin clinic in the next 1-2 days for PT/INR check. Patient was discharged on Coumadin 1 mg daily  Discharge Diagnoses:  Principal Problem:   Complete heart block Active Problems:   C. difficile colitis   Ischemic cardiomyopathy   Atrial fibrillation   Bradycardia with 31 - 40 beats per minute   Diarrhea   DM (diabetes mellitus), type 2 with renal complications   CKD (chronic kidney disease), stage III   Hypothyroidism   Chronic systolic CHF (congestive heart failure)   Bradycardia   Atrial fibrillation, unspecified   Type 2 diabetes mellitus with diabetic chronic kidney disease   Discharge Condition: Stable and improved  Diet recommendation: Heart healthy  Filed Weights   02/21/15 0540 02/22/15 0649 02/23/15 0605  Weight: 92.6 kg (204 lb 2.3 oz) 92 kg (202 lb 13.2 oz) 92.2 kg (203 lb 4.2 oz)    History of present illness:  This is a 80 year old man who signed out AMA one day prior to admission, from [facility]. He had been admitted there with diarrhea but was found to have significant bradycardia and atrial fibrillation. He was seen by cardiology and electrophysiology, who had planned for elective pacemaker implantation. Unfortunately he signed out AMA. He returned back because he has had some abdominal  discomfort and diarrhea. His appetite was poor. He denied any fever, vomiting, hematemesis. He denied any increased dyspnea or any chest pain. He did have a history of chronic congestive heart failure, systolic from ischemic cardio myopathy. His ejection fraction is known to be 20-25% on echocardiogram done in May 2014. He was now being admitted back for management of his problems  Hospital Course:  #1 complete heart block Patient had presented with a symptomatic complete heart block. Patient had left AMA during last hospitalization as such a pacemaker was not placed. Patient was seen in consultation with cardiology and patient had pacemaker placed 02/21/2015. Patient's amiodarone was resumed on discharge. Beta blocker was held. Patient will follow-up with cardiologist outpatient for wound check and for follow-up. Patient will be discharged in stable and improved condition.  #2 C. difficile colitis Patient presented with abdominal pain and diarrhea. Stool studies positive for C. difficile PCR. Patient was initially started on IV Flagyl however due to interaction with Coumadin Flagyl was discontinued and patient placed on oral vancomycin. Patient improved clinically his number of stools improved and consistency improved by day of discharge. Patient will be discharged home on 12 more days of oral vancomycin to complete a two-week course of antibiotic therapy. Patient will follow-up with PCP as outpatient. Patient was also discharged on Florastor.   #3 permanent atrial fibrillation Patient's CHADSVASC score is 6. Patient on admission was noted to be bradycardic. Patient's amiodarone and beta blocker were held. Patient's INR on admission was supratherapeutic. Patient was followed. Patient underwent pacemaker placement per cardiology without any complications.  Patient's amiodarone was resumed on discharge. Patient will be started back on his Coumadin at 1 mg daily and will need PT/INR checked early next week.  Patient will also follow-up with Dr. Graciela Husbands as outpatient.  #4 ischemic cardiomyopathy/CAD Patient denied any chest pain. Beta blocker was held secondary to problem #1.  patient was followed by cardiology throughout the hospitalization.   #5 hypothyroidism Continued on his home dose Synthroid.  #6 CKD  Remained stable.    #7 HTN Patient was maintained on Cozaar throughout the hospitalization.  #8 Chronic systolic CHF Stable. Patient remained asymptomatic. Patient is not on a diuretic. His home med rec sheet. Continued on ARB. Beta blocker was held secondary to problem #1. Patient on Coumadin.   Procedures:  CT Head 02/20/2015  CT abdomen and pelvis 02/20/2015  Chest x-ray 02/20/2015  Pacemaker implantation 02/21/2015 per Dr. Johney Frame  Consultations:  Cardiology: Dr. Johney Frame 02/21/2015  Discharge Exam: Filed Vitals:   02/23/15 0605  BP: 149/60  Pulse: 69  Temp: 97.6 F (36.4 C)  Resp: 20    General: NAD Cardiovascular: RRR Respiratory: CTAB  Discharge Instructions   Discharge Instructions    AMB Referral to [facility] Care Management    Complete by:  As directed   Reason for consult:  Hospital follow up; high risk re-admission; multiple meds  Diagnoses of:  Heart Failure  Expected date of contact:  1-3 days (reserved for hospital discharges)  Please assign to community nurse for transition of care calls and assess for home visits. Patient to have pacemaker placed likely today and discharge anticipated for 02/22/15 and needs follow up. Questions please call: Charlesetta Shanks, RN BSN CCM [facility]  [phone] business mobile phone     Diet - low sodium heart healthy    Complete by:  As directed      Discharge instructions    Complete by:  As directed   Follow up with PERINI,MARK A, MD in 1 week. Follow up with Dr Graciela Husbands, office will call with appointment time and time for wound check Follow up in Dr Graciela Husbands office for coumadin check, on monday or  Tuesday, call to set up appointment.     Increase activity slowly    Complete by:  As directed           Current Discharge Medication List    START taking these medications   Details  saccharomyces boulardii (FLORASTOR) 250 MG capsule Take 1 capsule (250 mg total) by mouth 2 (two) times daily.    vancomycin (VANCOCIN) 50 mg/mL oral solution Take 2.5 mLs (125 mg total) by mouth 4 (four) times daily. Take for 12 days then stop. Qty: 120 mL, Refills: 0      CONTINUE these medications which have CHANGED   Details  warfarin (COUMADIN) 1 MG tablet Take 1 tablet (1 mg total) by mouth daily. Take until seen in coumadin clinic. Qty: 30 tablet, Refills: 0      CONTINUE these medications which have NOT CHANGED   Details  ALPRAZolam (XANAX) 0.5 MG tablet Take 0.5 mg by mouth 3 (three) times daily as needed for anxiety.     finasteride (PROSCAR) 5 MG tablet Take 5 mg by mouth daily.     lactose free nutrition (BOOST) LIQD Take 237 mLs by mouth 3 (three) times daily between meals.    levothyroxine (SYNTHROID, LEVOTHROID) 125 MCG tablet Take 125 mcg by mouth daily.    losartan (COZAAR) 50 MG tablet Take 0.5 tablets (25 mg  total) by mouth daily. Qty: 30 tablet, Refills: 6    amiodarone (PACERONE) 200 MG tablet Take 1/2 tablet (100 mg total) by mouth 5 days a week. Qty: 15 tablet, Refills: 6      STOP taking these medications     metroNIDAZOLE (FLAGYL) 500 MG tablet        No Known Allergies Follow-up Information    Follow up with Sherryl Manges, MD.   Specialty:  Cardiology   Why:  Patient will be called with appontment for wound check and follow up   Contact information:   [address] [place] [address] [phone]       Follow up with Sherryl Manges, MD. Schedule an appointment as soon as possible for a visit on 02/25/2015.   Specialty:  Cardiology   Why:  call to schedule appointment for warfarin check on tuesday   Contact information:   [address] [place] [address] [phone]       Follow up with [facility].   Why:  home health physical and occupational therapy, nurse, aide and social worker.   Contact information:   [address] [place] [address] [phone]       Follow up with Rodrigo Ran A, MD. Schedule an appointment as soon as possible for a visit in 1 week.   Specialty:  Internal Medicine   Contact information:   [address] [place] [address] [phone]        The results of significant diagnostics from this hospitalization (including imaging, microbiology, ancillary and laboratory) are listed below for reference.    Significant Diagnostic Studies: Ct Abdomen Pelvis Wo Contrast  02/20/2015   CLINICAL DATA:  Persistent diarrhea.  Bradycardia.  EXAM: CT ABDOMEN AND PELVIS WITHOUT CONTRAST  TECHNIQUE: Multidetector CT imaging of the abdomen and pelvis was performed following the standard protocol without IV contrast.  COMPARISON:  08/09/2011 report, images not available  FINDINGS: Coronary artery calcifications with post CABG changes. Bilateral pleural effusions with compressive atelectasis. Pleural effusions are small to moderate in size. Negative for free air.  Unenhanced CT was performed per clinician order. Lack of IV contrast limits sensitivity and specificity, especially for evaluation of abdominal/pelvic solid viscera.  There is a small amount of perihepatic and perisplenic ascites. The gallbladder is mildly distended with high-density material suggestive for sludge and cannot exclude gallstones. No gross abnormality to the spleen or pancreas. Normal appearance of the adrenal glands and kidneys. Negative for kidney stones or hydronephrosis. There is a small amount of fluid or edema around the duodenum. No significant lymphadenopathy. There is edema or stranding in the lower abdomen around the iliac vessels, particularly on the left side. This appears  to be extraperitoneal fluid. There is diffuse subcutaneous edema. There is edema in the presacral region. No gross abnormality to the prostate or urinary bladder.  No gross abnormality to the small or large bowel. A normal appearing appendix.  Disc space narrowing and disease at L4-L5. No acute bone abnormality.  IMPRESSION: Bilateral pleural effusions with a small amount of intra-abdominal fluid and extensive subcutaneous edema. Findings are suggestive for anasarca.  The gallbladder is distended with high-density material that could represent sludge. Gallbladder could be further characterized with ultrasound.   Electronically Signed   By: Richarda Overlie M.D.   On: 02/20/2015 15:32   Ct Head Wo Contrast  02/20/2015   CLINICAL DATA:  LOC, diarrhea  EXAM: CT HEAD WITHOUT CONTRAST  TECHNIQUE: Contiguous axial images were obtained from the base of the skull through the vertex without intravenous contrast.  COMPARISON:  12/04/2009  FINDINGS: No skull fracture is noted. Paranasal sinuses and mastoid air cells are unremarkable. Atherosclerotic calcifications of carotid siphon again noted. Stable atrophy and chronic white matter disease. No acute cortical infarction. No mass lesion is noted on this unenhanced scan. Ventricular size is stable from prior exam.  IMPRESSION: No acute intracranial abnormality. Stable atrophy and chronic white matter disease. No acute cortical infarction.   Electronically Signed   By: Natasha Mead M.D.   On: 02/20/2015 15:32   Dg Chest Port 1 View  02/22/2015   CLINICAL DATA:  80 year old male status post placement of cardiac device  EXAM: PORTABLE CHEST - 1 VIEW  COMPARISON:  Preoperative chest x-ray 02/20/2015  FINDINGS: Interval placement of a right subclavian approach single lead cardiac rhythm maintenance device. The lead projects over the right ventricle. Cardiomegaly with left heart enlargement. Patient is status post median sternotomy with evidence of prior multivessel CABG including LIMA  bypass. Slightly increased pulmonary vascular congestion without overt edema. No evidence of pneumothorax or large pleural effusion. Inspiratory volumes are slightly low with mild bibasilar atelectasis. No acute osseous abnormality.  IMPRESSION: 1. New right subclavian approach single lead cardiac rhythm maintenance device. The lead projects over the right ventricle. 2. No evidence of pneumothorax. 3. Slightly increased pulmonary vascular congestion without overt edema. 4. Lower inspiratory volumes with bibasilar atelectasis.   Electronically Signed   By: Malachy Moan M.D.   On: 02/22/2015 12:13   Dg Chest Portable 1 View  02/20/2015   CLINICAL DATA:  Persistent diarrhea, bradycardia, history diabetes mellitus, hypertension, paroxysmal atrial fibrillation, coronary artery disease post MI and and CABG, ischemic cardiomyopathy  EXAM: PORTABLE CHEST - 1 VIEW  COMPARISON:  Portable exam 1329 hours compared to 02/16/2015  FINDINGS: Enlargement of cardiac silhouette post CABG.  Slight pulmonary vascular congestion.  Bibasilar atelectasis.  Lungs otherwise clear.  No pleural effusion or pneumothorax.  IMPRESSION: Enlargement of cardiac silhouette with pulmonary vascular congestion post CABG.  Mild bibasilar atelectasis.   Electronically Signed   By: Ulyses Southward M.D.   On: 02/20/2015 14:00   Dg Chest Port 1 View  02/16/2015   CLINICAL DATA:  Shortness of breath, CHF.  EXAM: PORTABLE CHEST - 1 VIEW  COMPARISON:  02/09/2015  FINDINGS: Cardiomegaly and CABG changes again noted.  Pulmonary vascular congestion with possible mild interstitial edema again noted.  There is no evidence of pneumothorax.  Mild bilateral lower lung atelectasis/airspace disease again noted.  IMPRESSION: Little significant change. Bilateral lower lung atelectasis/ airspace disease. Pneumonia not excluded.  Cardiomegaly with pulmonary vascular congestion and possible mild interstitial edema.   Electronically Signed   By: Harmon Pier M.D.   On:  02/16/2015 16:28    Microbiology: Recent Results (from the past 240 hour(s))  Clostridium Difficile by PCR     Status: None   Collection Time: 02/14/15  8:15 PM  Result Value Ref Range Status   C difficile by pcr NEGATIVE NEGATIVE Final  MRSA PCR Screening     Status: None   Collection Time: 02/14/15 11:10 PM  Result Value Ref Range Status   MRSA by PCR NEGATIVE NEGATIVE Final    Comment:        The GeneXpert MRSA Assay (FDA approved for NASAL specimens only), is one component of a comprehensive MRSA colonization surveillance program. It is not intended to diagnose MRSA infection nor  to guide or monitor treatment for MRSA infections.   Culture, Urine     Status: None   Collection Time: 02/16/15  5:00 PM  Result Value Ref Range Status   Specimen Description URINE, CLEAN CATCH  Final   Special Requests NONE  Final   Colony Count   Final    6,000 COLONIES/ML Performed at [facility]    Culture   Final    INSIGNIFICANT GROWTH Performed at [facility]    Report Status 02/17/2015 FINAL  Final  Clostridium Difficile by PCR     Status: Abnormal   Collection Time: 02/21/15 10:25 AM  Result Value Ref Range Status   C difficile by pcr POSITIVE (A) NEGATIVE Final    Comment: CRITICAL RESULT CALLED TO, READ BACK BY AND VERIFIED WITH: T. FAIRING RN 11:40 02/21/15 ([email or web address])      Labs: Basic Metabolic Panel:  Recent Labs Lab 02/18/15 0256 02/19/15 0346 02/20/15 1245 02/21/15 1208 02/22/15 0900 02/23/15 0425  NA 137 137 138  --  135 137  K 4.5 4.7 4.5  --  4.4 4.7  CL 104 103 103  --  101 103  CO2 24 27 26   --  24 28  GLUCOSE 127* 126* 161*  --  151* 104*  BUN 52* 45* 45*  --  31* 25*  CREATININE 1.62* 1.54* 1.53*  --  1.32* 1.14  CALCIUM 8.6* 8.5* 8.8*  --  8.3* 8.4*  MG  --   --   --  2.4  --   --    Liver Function Tests:  Recent Labs Lab 02/17/15 0400 02/20/15 1245 02/22/15 0900  AST 70* 31 20  ALT 123* 66* 39  ALKPHOS 93 97 76  BILITOT  2.3* 1.7* 1.8*  PROT 5.6* 6.4* 5.5*  ALBUMIN 3.0* 3.4* 2.8*   No results for input(s): LIPASE, AMYLASE in the last 168 hours. No results for input(s): AMMONIA in the last 168 hours. CBC:  Recent Labs Lab 02/18/15 0256 02/19/15 0346 02/20/15 1245 02/22/15 0900 02/23/15 0425  WBC 8.5 6.3 9.0 9.2 7.4  NEUTROABS  --   --  6.7  --   --   HGB 11.2* 11.0* 11.7* 10.8* 10.8*  HCT 35.6* 35.4* 37.8* 35.1* 33.4*  MCV 92.7 91.9 93.8 90.7 91.8  PLT 272 287 341 240 192   Cardiac Enzymes:  Recent Labs Lab 02/20/15 1245 02/21/15 0015  TROPONINI 0.03 0.05*   BNP: BNP (last 3 results)  Recent Labs  02/17/15 0400  BNP 1599.2*    ProBNP (last 3 results) No results for input(s): PROBNP in the last 8760 hours.  CBG:  Recent Labs Lab 02/21/15 1140 02/21/15 2120 02/22/15 1211 02/22/15 1628 02/22/15 2212  GLUCAP 137* 141* 113* 113* 129*       Signed:  THOMPSON,[name] M.D. [facility] 02/23/2015, 3:10 PM

## 2015-02-23 NOTE — Discharge Instructions (Signed)
° ° °  Supplemental Discharge Instructions for  Pacemaker Patients  Activity No heavy lifting or vigorous activity with your left/right arm for 6 to 8 weeks.  Do not raise your left/right arm above your head for one week.  Gradually raise your affected arm as drawn below.            02/25/15                          02/26/15                       02/27/15                  02/28/15  NO DRIVING for   1 week  ; you may begin driving on   8/63/81  .  WOUND CARE - Keep the wound area clean and dry.  Do not get this area wet for one week. No showers for one week; you may shower on  03/01/14  . - The tape/steri-strips on your wound will fall off; do not pull them off.  No bandage is needed on the site.  DO  NOT apply any creams, oils, or ointments to the wound area. - If you notice any drainage or discharge from the wound, any swelling or bruising at the site, or you develop a fever > 101? F after you are discharged home, call the office at once.  Special Instructions - You are still able to use cellular telephones; use the ear opposite the side where you have your pacemaker.  Avoid carrying your cellular phone near your device. - When traveling through airports, show security personnel your identification card to avoid being screened in the metal detectors.  Ask the security personnel to use the hand wand. - Avoid arc welding equipment, MRI testing (magnetic resonance imaging), TENS units (transcutaneous nerve stimulators).  Call the office for questions about other devices. - Avoid electrical appliances that are in poor condition or are not properly grounded. - Microwave ovens are safe to be near or to operate.

## 2015-02-23 NOTE — Care Management Note (Signed)
Case Management Note  Patient Details  Name: [name] MRN: [id number] Date of Birth: Jun 25, 1935  Subjective/Objective:                   abdominal discomfort and diarrhea Action/Plan: Discharge planning  Expected Discharge Date:  02/23/15               Expected Discharge Plan:  [place]  In-House Referral:     Discharge planning Services  CM Consult  Post Acute Care Choice:    Choice offered to:  Adult Children  DME Arranged:    DME Agency:     HH Arranged:  RN, Disease Management, PT, OT, Nurse's Aide [place] Agency:  [facility]  Status of Service:  Completed, signed off  Medicare Important Message Given:    Date Medicare IM Given:    Medicare IM give by:    Date Additional Medicare IM Given:    Additional Medicare Important Message give by:     If discussed at [place] of Stay Meetings, dates discussed:    Additional Comments: CM met with daughter-in-law [name] who chooses [facility] to render HHPT/RN/OT/aide.  Address and contact information verified with [name] and her number is [phone] and is primary contact.  Referral called to [facility] rep, [name].  No other CM needs were communicated.   Dellie Catholic, RN 02/23/2015, 2:07 PM

## 2015-02-23 NOTE — Evaluation (Signed)
Physical Therapy Evaluation Patient Details Name: [name] MRN: [id number] DOB: 05-09-1935 Today's Date: 02/23/2015   History of Present Illness  Pt is a 80 y.o. male with PMH of ischemic cardiomyopathy,  VT, DM, a-fib, CAD s/p CABG x5, cardioversion who was recently admitted 5/13 with bradycardia and diarrhea and was planned to d/c to SNF, however left AMA on 5/18. Pt readmitted 02/20/15 with diarrhea.  Clinical Impression  Patient did better with gait this afternoon than he did with OT earlier today.  Appeared more steady this pm.  I do recommend that patient use rolling walker.  Overall, I feel patient would be safest in SNF mostly due to cognitive concerns.  Patient adamantly refusing SNF.  From mobility standpoint, feel patient will be safe at home with rolling walker and intermittent supervision.  I do recommend HHPT to continue to progress patient.  He is limited by cognitive concerns and cardiopulmonary status.      Follow Up Recommendations SNF (due to cognitive concerns; patient adamantly refusing SNF)    Equipment Recommendations  Rolling walker with 5" wheels    Recommendations for Other Services       Precautions / Restrictions Precautions Precautions: Fall Restrictions Weight Bearing Restrictions: No      Mobility  Bed Mobility Overal bed mobility: Independent Bed Mobility: Rolling;Sit to Supine Rolling: Independent   Supine to sit: Supervision Sit to supine: Independent   General bed mobility comments: Supervision to sit EOB. No physical assist needed  Transfers Overall transfer level: Modified independent Equipment used: Rolling walker (2 wheeled) Transfers: Sit to/from Stand Sit to Stand: Modified independent (Device/Increase time)         General transfer comment: Min guard to stand and has good hand placement. From EOB and from recliner x3.   Ambulation/Gait Ambulation/Gait assistance: Supervision Ambulation Distance (Feet): 50 Feet Assistive  device: Rolling walker (2 wheeled) Gait Pattern/deviations: Step-through pattern     General Gait Details: required cueing to stay within rolling walker during gait.  No loss of balance noted.  Stairs            Wheelchair Mobility    Modified Rankin (Stroke Patients Only)       Balance Overall balance assessment: Needs assistance Sitting-balance support: Feet supported;No upper extremity supported Sitting balance-Leahy Scale: Good     Standing balance support: Bilateral upper extremity supported Standing balance-Leahy Scale: Poor                               Pertinent Vitals/Pain Pain Assessment: No/denies pain    Home Living Family/patient expects to be discharged to:: Private residence Living Arrangements: Alone (spouse currently at [facility]) Available Help at Discharge: Other (Comment) (daughter lives in [place] or [place] (unclear)) Type of Home: House Home Access: Stairs to enter   [employer] of Steps: 2 Home Layout: One level Home Equipment: Cane - single point Additional Comments: Pt not giving clear answers. Reports his daughter lives in [place], then stated she lives in [place]. Became irritated when OT pointed this out. Then he stated that the doctor who recently left his room was his son he adopted. When OT clarified, pt angrily stated "No, he's not my son. You aren't understanding me." Wife in SNF due to hip fx    Prior Function Level of Independence: Independent               Hand Dominance  Extremity/Trunk Assessment   Upper Extremity Assessment: Generalized weakness           Lower Extremity Assessment: Generalized weakness      Cervical / Trunk Assessment: Kyphotic  Communication   Communication: No difficulties  Cognition Arousal/Alertness: Awake/alert Behavior During Therapy: Agitated Overall Cognitive Status: Impaired/Different from baseline Area of Impairment:  Orientation;Attention;Memory;Following commands;Safety/judgement;Awareness;Problem solving Orientation Level: Disoriented to;Place;Time;Situation Current Attention Level: Focused Memory: Decreased short-term memory Following Commands: Follows one step commands inconsistently;Follows one step commands with increased time Safety/Judgement: Decreased awareness of safety;Decreased awareness of deficits Awareness: Intellectual Problem Solving: Slow processing      General Comments      Exercises        Assessment/Plan    PT Assessment All further PT needs can be met in the next venue of care  PT Diagnosis Generalized weakness   PT Problem List Decreased strength;Decreased activity tolerance;Decreased safety awareness;Cardiopulmonary status limiting activity  PT Treatment Interventions     PT Goals (Current goals can be found in the Care Plan section) Acute Rehab PT Goals Patient Stated Goal: To get out of here    Frequency     Barriers to discharge Decreased caregiver support patient living alone at current with wife in SNF due to hip fx.      Co-evaluation               End of Session Equipment Utilized During Treatment: Gait belt Activity Tolerance: Patient tolerated treatment well Patient left: in bed;with call bell/phone within reach           Time: 1300-1315 PT Time Calculation (min) (ACUTE ONLY): 15 min   Charges:   PT Evaluation $Initial PT Evaluation Tier I: 1 Procedure     PT G CodesShanna Cisco 02/23/2015, 1:30 PM  02/23/2015 [name], [place]

## 2015-02-23 NOTE — Progress Notes (Signed)
Sent staff msg to schedulers to arrange Coumadin check for Monday or Tuesday, 7-10 day wound check and f/u in 3 months with Dr. Graciela Husbands. Dayna Dunn PA-C

## 2015-02-23 NOTE — Progress Notes (Signed)
Patient O2 sats on R/A at 88% to 90 %. MD aware. Will continue to monitor Patient

## 2015-02-23 NOTE — Progress Notes (Signed)
[facility] PROGRESS NOTE  [name] ZOX:[id number] DOB: [birth year] DOA: 02/20/2015 PCP: Ezequiel Kayser, MD  Assessment/Plan: #1 complete heart block Patient had presented with a symptomatic complete heart block. Patient had left AMA during last hospitalization as such a pacemaker was not placed. Patient has been seen in consultation with cardiology and patient had pacemaker placed 02/21/2015. Cardiology following.  #2 C. difficile colitis Patient presented with abdominal pain and diarrhea. Stool studies positive for C. difficile PCR. Patient denies any abdominal pain. Clinical improvement. Improved consistency stools per nursing tech. Will change IV Flagyl and oral vancomycin secondary to interaction with Coumadin. Continue Florastor. Supportive care. Pain management. Anti-emetics. Patient tolerating current diet.  #3 permanent atrial fibrillation Patient's CHADSVASC score is 6. Patient currently bradycardic. Continue to hold amiodarone and beta blocker for rate control. INR is supratherapeutic at 3.09. Continue Coumadin per pharmacy.  #4 ischemic cardiomyopathy/CAD Patient denies any chest pain. Beta blocker on hold secondary to problem #1. Per cardiology.  #5 hypothyroidism Continue home dose Synthroid.  #6 CKD  Stable.   #7 HTN Cozaar   #8 Chronic systolic CHF Stable. Patient currently asymptomatic. Patient is not on a diuretic. His home med rec sheet. Continue ARB. Beta blocker on hold secondary to problem #1. Patient on Coumadin. Per cardiology.  #9 prophylaxis On Coumadin for DVT prophylaxis.   Code Status:  Full Family Communication: Updated patient of family at bedside. Disposition Plan: Home when medically stable and diarrhea has improved, hopefully tomorrow.   Consultants:  Cardiology: Dr. Johney Frame 02/21/2015  Procedures:  CT Head 02/20/2015  CT abdomen and pelvis 02/20/2015  Chest x-ray 02/20/2015  Pacemaker implantation 02/21/2015 per Dr.  Johney Frame  Antibiotics:  IV Flagyl 02/21/15>>>>> 02/22/2015  Oral vancomycin 02/22/2015  HPI/Subjective: Patient sleeping. Patient denies any chest. No shortness of breath. No abdominal pain. Patient denies any nausea vomiting. Patient tolerating current diet. Patient denies any bleeding. Patient concerned about his pets at home. Per nurse patient unable to ambulate on his own.  Objective: Filed Vitals:   02/23/15 0605  BP: 149/60  Pulse: 69  Temp: 97.6 F (36.4 C)  Resp: 20    Intake/Output Summary (Last 24 hours) at 02/23/15 1136 Last data filed at 02/23/15 0900  Gross per 24 hour  Intake    720 ml  Output    900 ml  Net   -180 ml   Filed Weights   02/21/15 0540 02/22/15 0649 02/23/15 0605  Weight: 92.6 kg (204 lb 2.3 oz) 92 kg (202 lb 13.2 oz) 92.2 kg (203 lb 4.2 oz)    Exam:   General:  NAD  Cardiovascular: RRR  Respiratory: CTAB  Abdomen: Soft, NTTP, positive bowel sounds, no rebound, no guarding  Musculoskeletal: No clubbing cyanosis or edema.  Data Reviewed: Basic Metabolic Panel:  Recent Labs Lab 02/18/15 0256 02/19/15 0346 02/20/15 1245 02/21/15 1208 02/22/15 0900 02/23/15 0425  NA 137 137 138  --  135 137  K 4.5 4.7 4.5  --  4.4 4.7  CL 104 103 103  --  101 103  CO2 --  24 28  GLUCOSE 127* 126* 161*  --  151* 104*  BUN 52* 45* 45*  --  31* 25*  CREATININE 1.62* 1.54* 1.53*  --  1.32* 1.14  CALCIUM 8.6* 8.5* 8.8*  --  8.3* 8.4*  MG  --   --   --  2.4  --   --    Liver Function Tests:  Recent Labs  Lab 02/17/15 0400 02/20/15 1245 02/22/15 0900  AST 70* 31 20  ALT 123* 66* 39  ALKPHOS 93 97 76  BILITOT 2.3* 1.7* 1.8*  PROT 5.6* 6.4* 5.5*  ALBUMIN 3.0* 3.4* 2.8*   No results for input(s): LIPASE, AMYLASE in the last 168 hours. No results for input(s): AMMONIA in the last 168 hours. CBC:  Recent Labs Lab 02/18/15 0256 02/19/15 0346 02/20/15 1245 02/22/15 0900 02/23/15 0425  WBC 8.5 6.3 9.0 9.2 7.4  NEUTROABS  --    --  6.7  --   --   HGB 11.2* 11.0* 11.7* 10.8* 10.8*  HCT 35.6* 35.4* 37.8* 35.1* 33.4*  MCV 92.7 91.9 93.8 90.7 91.8  PLT 272 287 341 240 192   Cardiac Enzymes:  Recent Labs Lab 02/20/15 1245 02/21/15 0015  TROPONINI 0.03 0.05*   BNP (last 3 results)  Recent Labs  02/17/15 0400  BNP 1599.2*    ProBNP (last 3 results) No results for input(s): PROBNP in the last 8760 hours.  CBG:  Recent Labs Lab 02/21/15 1140 02/21/15 2120 02/22/15 1211 02/22/15 1628 02/22/15 2212  GLUCAP 137* 141* 113* 113* 129*    Recent Results (from the past 240 hour(s))  Clostridium Difficile by PCR     Status: None   Collection Time: 02/14/15  8:15 PM  Result Value Ref Range Status   C difficile by pcr NEGATIVE NEGATIVE Final  MRSA PCR Screening     Status: None   Collection Time: 02/14/15 11:10 PM  Result Value Ref Range Status   MRSA by PCR NEGATIVE NEGATIVE Final    Comment:        The GeneXpert MRSA Assay (FDA approved for NASAL specimens only), is one component of a comprehensive MRSA colonization surveillance program. It is not intended to diagnose MRSA infection nor to guide or monitor treatment for MRSA infections.   Culture, Urine     Status: None   Collection Time: 02/16/15  5:00 PM  Result Value Ref Range Status   Specimen Description URINE, CLEAN CATCH  Final   Special Requests NONE  Final   Colony Count   Final    6,000 COLONIES/ML Performed at [facility]    Culture   Final    INSIGNIFICANT GROWTH Performed at [facility]    Report Status 02/17/2015 FINAL  Final  Clostridium Difficile by PCR     Status: Abnormal   Collection Time: 02/21/15 10:25 AM  Result Value Ref Range Status   C difficile by pcr POSITIVE (A) NEGATIVE Final    Comment: CRITICAL RESULT CALLED TO, READ BACK BY AND VERIFIED WITH: T. FAIRING RN 11:40 02/21/15 ([email or web address])      Studies: Dg Chest Port 1 View  02/22/2015   CLINICAL DATA:  80 year old male status post  placement of cardiac device  EXAM: PORTABLE CHEST - 1 VIEW  COMPARISON:  Preoperative chest x-ray 02/20/2015  FINDINGS: Interval placement of a right subclavian approach single lead cardiac rhythm maintenance device. The lead projects over the right ventricle. Cardiomegaly with left heart enlargement. Patient is status post median sternotomy with evidence of prior multivessel CABG including LIMA bypass. Slightly increased pulmonary vascular congestion without overt edema. No evidence of pneumothorax or large pleural effusion. Inspiratory volumes are slightly low with mild bibasilar atelectasis. No acute osseous abnormality.  IMPRESSION: 1. New right subclavian approach single lead cardiac rhythm maintenance device. The lead projects over the right ventricle. 2. No evidence of pneumothorax. 3. Slightly increased pulmonary vascular congestion  without overt edema. 4. Lower inspiratory volumes with bibasilar atelectasis.   Electronically Signed   By: Malachy Moan M.D.   On: 02/22/2015 12:13    Scheduled Meds: . finasteride  5 mg Oral Daily  . lactose free nutrition  237 mL Oral TID BM  . levothyroxine  125 mcg Oral QAC breakfast  . losartan  25 mg Oral Daily  . saccharomyces boulardii  250 mg Oral BID  . sodium chloride  3 mL Intravenous Q12H  . vancomycin  125 mg Oral QID  . Warfarin - Pharmacist Dosing Inpatient   Does not apply q1800   Continuous Infusions:    Principal Problem:   Complete heart block Active Problems:   C. difficile colitis   Ischemic cardiomyopathy   Atrial fibrillation   Bradycardia with 31 - 40 beats per minute   Diarrhea   DM (diabetes mellitus), type 2 with renal complications   CKD (chronic kidney disease), stage III   Hypothyroidism   Chronic systolic CHF (congestive heart failure)   Bradycardia   Atrial fibrillation, unspecified    Time spent: 35 MINS    [facility] MD [facility] Pager [phone]. If 7PM-7AM, please contact night-coverage  at [email or web address], password [facility] 02/23/2015, 11:36 AM  LOS: 3 days

## 2015-02-24 ENCOUNTER — Telehealth: Payer: Self-pay | Admitting: Internal Medicine

## 2015-02-24 ENCOUNTER — Inpatient Hospital Stay (HOSPITAL_COMMUNITY)
Admission: EM | Admit: 2015-02-24 | Discharge: 2015-02-27 | DRG: 291 | Disposition: A | Discharge status: Skilled Nursing Facility | Payer: Medicare Other | Attending: Family Medicine | Admitting: Family Medicine

## 2015-02-24 ENCOUNTER — Other Ambulatory Visit (HOSPITAL_COMMUNITY): Payer: Self-pay

## 2015-02-24 ENCOUNTER — Other Ambulatory Visit: Payer: Self-pay | Admitting: *Deleted

## 2015-02-24 ENCOUNTER — Emergency Department (HOSPITAL_COMMUNITY): Payer: Medicare Other

## 2015-02-24 ENCOUNTER — Encounter (HOSPITAL_COMMUNITY): Payer: Self-pay | Admitting: Internal Medicine

## 2015-02-24 DIAGNOSIS — J9600 Acute respiratory failure, unspecified whether with hypoxia or hypercapnia: Secondary | ICD-10-CM | POA: Diagnosis present

## 2015-02-24 DIAGNOSIS — I5023 Acute on chronic systolic (congestive) heart failure: Secondary | ICD-10-CM | POA: Diagnosis not present

## 2015-02-24 DIAGNOSIS — R0602 Shortness of breath: Secondary | ICD-10-CM | POA: Diagnosis not present

## 2015-02-24 DIAGNOSIS — Z9111 Patient's noncompliance with dietary regimen: Secondary | ICD-10-CM | POA: Diagnosis present

## 2015-02-24 DIAGNOSIS — Z950 Presence of cardiac pacemaker: Secondary | ICD-10-CM | POA: Diagnosis not present

## 2015-02-24 DIAGNOSIS — Z9841 Cataract extraction status, right eye: Secondary | ICD-10-CM | POA: Diagnosis not present

## 2015-02-24 DIAGNOSIS — E785 Hyperlipidemia, unspecified: Secondary | ICD-10-CM | POA: Diagnosis present

## 2015-02-24 DIAGNOSIS — E43 Unspecified severe protein-calorie malnutrition: Secondary | ICD-10-CM | POA: Diagnosis present

## 2015-02-24 DIAGNOSIS — I4891 Unspecified atrial fibrillation: Secondary | ICD-10-CM | POA: Diagnosis present

## 2015-02-24 DIAGNOSIS — I480 Paroxysmal atrial fibrillation: Secondary | ICD-10-CM | POA: Diagnosis not present

## 2015-02-24 DIAGNOSIS — I5030 Unspecified diastolic (congestive) heart failure: Secondary | ICD-10-CM | POA: Diagnosis not present

## 2015-02-24 DIAGNOSIS — E039 Hypothyroidism, unspecified: Secondary | ICD-10-CM | POA: Diagnosis present

## 2015-02-24 DIAGNOSIS — A047 Enterocolitis due to Clostridium difficile: Secondary | ICD-10-CM | POA: Diagnosis present

## 2015-02-24 DIAGNOSIS — Z9842 Cataract extraction status, left eye: Secondary | ICD-10-CM | POA: Diagnosis not present

## 2015-02-24 DIAGNOSIS — E860 Dehydration: Secondary | ICD-10-CM | POA: Diagnosis present

## 2015-02-24 DIAGNOSIS — I482 Chronic atrial fibrillation: Secondary | ICD-10-CM | POA: Diagnosis not present

## 2015-02-24 DIAGNOSIS — I517 Cardiomegaly: Secondary | ICD-10-CM | POA: Diagnosis not present

## 2015-02-24 DIAGNOSIS — I10 Essential (primary) hypertension: Secondary | ICD-10-CM | POA: Diagnosis present

## 2015-02-24 DIAGNOSIS — E119 Type 2 diabetes mellitus without complications: Secondary | ICD-10-CM | POA: Diagnosis present

## 2015-02-24 DIAGNOSIS — I5022 Chronic systolic (congestive) heart failure: Secondary | ICD-10-CM | POA: Diagnosis not present

## 2015-02-24 DIAGNOSIS — N400 Enlarged prostate without lower urinary tract symptoms: Secondary | ICD-10-CM | POA: Diagnosis present

## 2015-02-24 DIAGNOSIS — Z5181 Encounter for therapeutic drug level monitoring: Secondary | ICD-10-CM

## 2015-02-24 DIAGNOSIS — I509 Heart failure, unspecified: Secondary | ICD-10-CM | POA: Diagnosis not present

## 2015-02-24 DIAGNOSIS — I5043 Acute on chronic combined systolic (congestive) and diastolic (congestive) heart failure: Principal | ICD-10-CM | POA: Diagnosis present

## 2015-02-24 DIAGNOSIS — I442 Atrioventricular block, complete: Secondary | ICD-10-CM | POA: Diagnosis present

## 2015-02-24 DIAGNOSIS — K219 Gastro-esophageal reflux disease without esophagitis: Secondary | ICD-10-CM | POA: Diagnosis present

## 2015-02-24 DIAGNOSIS — F329 Major depressive disorder, single episode, unspecified: Secondary | ICD-10-CM | POA: Diagnosis present

## 2015-02-24 DIAGNOSIS — I255 Ischemic cardiomyopathy: Secondary | ICD-10-CM | POA: Diagnosis not present

## 2015-02-24 DIAGNOSIS — Z66 Do not resuscitate: Secondary | ICD-10-CM | POA: Diagnosis present

## 2015-02-24 DIAGNOSIS — M1990 Unspecified osteoarthritis, unspecified site: Secondary | ICD-10-CM | POA: Diagnosis present

## 2015-02-24 DIAGNOSIS — Z7901 Long term (current) use of anticoagulants: Secondary | ICD-10-CM

## 2015-02-24 DIAGNOSIS — Z951 Presence of aortocoronary bypass graft: Secondary | ICD-10-CM

## 2015-02-24 DIAGNOSIS — R531 Weakness: Secondary | ICD-10-CM | POA: Insufficient documentation

## 2015-02-24 DIAGNOSIS — F419 Anxiety disorder, unspecified: Secondary | ICD-10-CM | POA: Diagnosis present

## 2015-02-24 DIAGNOSIS — R1030 Lower abdominal pain, unspecified: Secondary | ICD-10-CM | POA: Diagnosis not present

## 2015-02-24 DIAGNOSIS — I2510 Atherosclerotic heart disease of native coronary artery without angina pectoris: Secondary | ICD-10-CM | POA: Diagnosis present

## 2015-02-24 DIAGNOSIS — A0472 Enterocolitis due to Clostridium difficile, not specified as recurrent: Secondary | ICD-10-CM | POA: Diagnosis present

## 2015-02-24 DIAGNOSIS — R1032 Left lower quadrant pain: Secondary | ICD-10-CM

## 2015-02-24 DIAGNOSIS — I252 Old myocardial infarction: Secondary | ICD-10-CM | POA: Diagnosis not present

## 2015-02-24 LAB — COMPREHENSIVE METABOLIC PANEL
ALBUMIN: 3.1 g/dL — AB (ref 3.5–5.0)
ALT: 23 U/L (ref 17–63)
AST: 25 U/L (ref 15–41)
Alkaline Phosphatase: 69 U/L (ref 38–126)
Anion gap: 8 (ref 5–15)
BILIRUBIN TOTAL: 2.1 mg/dL — AB (ref 0.3–1.2)
BUN: 22 mg/dL — ABNORMAL HIGH (ref 6–20)
CALCIUM: 8.5 mg/dL — AB (ref 8.9–10.3)
CO2: 26 mmol/L (ref 22–32)
Chloride: 102 mmol/L (ref 101–111)
Creatinine, Ser: 1.2 mg/dL (ref 0.61–1.24)
GFR, EST NON AFRICAN AMERICAN: 57 mL/min — AB (ref >60)
Glucose, Bld: 113 mg/dL — ABNORMAL HIGH (ref 65–99)
Potassium: 4.3 mmol/L (ref 3.5–5.1)
Sodium: 136 mmol/L (ref 135–145)
Total Protein: 5.7 g/dL — ABNORMAL LOW (ref 6.5–8.1)

## 2015-02-24 LAB — CBC WITH DIFFERENTIAL/PLATELET
Basophils Absolute: 0 10*3/uL (ref 0.0–0.1)
Basophils Relative: 0 % (ref 0–1)
EOS PCT: 0 % (ref 0–5)
Eosinophils Absolute: 0 10*3/uL (ref 0.0–0.7)
HCT: 35 % — ABNORMAL LOW (ref 39.0–52.0)
Hemoglobin: 11.1 g/dL — ABNORMAL LOW (ref 13.0–17.0)
LYMPHS ABS: 0.4 10*3/uL — AB (ref 0.7–4.0)
LYMPHS PCT: 5 % — AB (ref 12–46)
MCH: 28.4 pg (ref 26.0–34.0)
MCHC: 31.7 g/dL (ref 30.0–36.0)
MCV: 89.5 fL (ref 78.0–100.0)
MONOS PCT: 9 % (ref 3–12)
Monocytes Absolute: 0.7 10*3/uL (ref 0.1–1.0)
NEUTROS ABS: 7.2 10*3/uL (ref 1.7–7.7)
Neutrophils Relative %: 86 % — ABNORMAL HIGH (ref 43–77)
PLATELETS: 188 10*3/uL (ref 150–400)
RBC: 3.91 MIL/uL — ABNORMAL LOW (ref 4.22–5.81)
RDW: 15.3 % (ref 11.5–15.5)
WBC: 8.3 10*3/uL (ref 4.0–10.5)

## 2015-02-24 LAB — URINALYSIS, ROUTINE W REFLEX MICROSCOPIC
Bilirubin Urine: NEGATIVE
GLUCOSE, UA: NEGATIVE mg/dL
HGB URINE DIPSTICK: NEGATIVE
KETONES UR: NEGATIVE mg/dL
Leukocytes, UA: NEGATIVE
Nitrite: NEGATIVE
Protein, ur: NEGATIVE mg/dL
Specific Gravity, Urine: 1.014 (ref 1.005–1.030)
Urobilinogen, UA: 1 mg/dL (ref 0.0–1.0)
pH: 6.5 (ref 5.0–8.0)

## 2015-02-24 LAB — TROPONIN I: Troponin I: 0.04 ng/mL — ABNORMAL HIGH (ref <0.031)

## 2015-02-24 LAB — PROTIME-INR
INR: 2.14 — ABNORMAL HIGH (ref 0.00–1.49)
Prothrombin Time: 23.8 seconds — ABNORMAL HIGH (ref 11.6–15.2)

## 2015-02-24 LAB — LIPASE, BLOOD: Lipase: 28 U/L (ref 22–51)

## 2015-02-24 LAB — BRAIN NATRIURETIC PEPTIDE: B NATRIURETIC PEPTIDE 5: 1936.4 pg/mL — AB (ref 0.0–100.0)

## 2015-02-24 LAB — I-STAT CG4 LACTIC ACID, ED: Lactic Acid, Venous: 1.71 mmol/L (ref 0.5–2.0)

## 2015-02-24 LAB — I-STAT TROPONIN, ED: Troponin i, poc: 0.01 ng/mL (ref 0.00–0.08)

## 2015-02-24 LAB — GLUCOSE, CAPILLARY: Glucose-Capillary: 126 mg/dL — ABNORMAL HIGH (ref 65–99)

## 2015-02-24 MED ORDER — FUROSEMIDE 40 MG PO TABS
40.0000 mg | ORAL_TABLET | Freq: Every day | ORAL | Status: DC
  Administered 2015-02-24 – 2015-02-27 (×4): 40 mg via ORAL
  Filled 2015-02-24 (×4): qty 1

## 2015-02-24 MED ORDER — AMIODARONE HCL 100 MG PO TABS
100.0000 mg | ORAL_TABLET | Freq: Every day | ORAL | Status: DC
  Administered 2015-02-24 – 2015-02-27 (×4): 100 mg via ORAL
  Filled 2015-02-24 (×4): qty 1

## 2015-02-24 MED ORDER — LOSARTAN POTASSIUM 25 MG PO TABS
25.0000 mg | ORAL_TABLET | Freq: Every day | ORAL | Status: DC
  Administered 2015-02-24 – 2015-02-25 (×2): 25 mg via ORAL
  Filled 2015-02-24 (×2): qty 1

## 2015-02-24 MED ORDER — ALUM & MAG HYDROXIDE-SIMETH 200-200-20 MG/5ML PO SUSP
15.0000 mL | ORAL | Status: DC | PRN
  Administered 2015-02-24 – 2015-02-26 (×4): 15 mL via ORAL
  Filled 2015-02-24 (×4): qty 30

## 2015-02-24 MED ORDER — CARVEDILOL 3.125 MG PO TABS
3.1250 mg | ORAL_TABLET | Freq: Two times a day (BID) | ORAL | Status: DC
  Administered 2015-02-25 – 2015-02-27 (×5): 3.125 mg via ORAL
  Filled 2015-02-24 (×7): qty 1

## 2015-02-24 MED ORDER — ACETAMINOPHEN 325 MG PO TABS
650.0000 mg | ORAL_TABLET | ORAL | Status: DC | PRN
  Administered 2015-02-25: 650 mg via ORAL
  Filled 2015-02-24: qty 2

## 2015-02-24 MED ORDER — SODIUM CHLORIDE 0.9 % IJ SOLN: 3.0000 mL | INTRAMUSCULAR | Status: DC | PRN

## 2015-02-24 MED ORDER — BOOST PO LIQD
237.0000 mL | Freq: Three times a day (TID) | ORAL | Status: DC
  Administered 2015-02-24 – 2015-02-26 (×7): 237 mL via ORAL
  Filled 2015-02-24 (×13): qty 237

## 2015-02-24 MED ORDER — ONDANSETRON HCL 4 MG/2ML IJ SOLN: 4.0000 mg | Freq: Four times a day (QID) | INTRAMUSCULAR | Status: DC | PRN

## 2015-02-24 MED ORDER — ATORVASTATIN CALCIUM 20 MG PO TABS
20.0000 mg | ORAL_TABLET | Freq: Every day | ORAL | Status: DC
  Administered 2015-02-24 – 2015-02-26 (×3): 20 mg via ORAL
  Filled 2015-02-24 (×4): qty 1

## 2015-02-24 MED ORDER — FINASTERIDE 5 MG PO TABS
5.0000 mg | ORAL_TABLET | Freq: Every day | ORAL | Status: DC
  Administered 2015-02-24 – 2015-02-27 (×4): 5 mg via ORAL
  Filled 2015-02-24 (×4): qty 1

## 2015-02-24 MED ORDER — MORPHINE SULFATE 4 MG/ML IJ SOLN
4.0000 mg | Freq: Once | INTRAMUSCULAR | Status: AC
  Administered 2015-02-24: 4 mg via INTRAVENOUS
  Filled 2015-02-24: qty 1

## 2015-02-24 MED ORDER — SODIUM CHLORIDE 0.9 % IV SOLN: 250.0000 mL | INTRAVENOUS | Status: DC | PRN

## 2015-02-24 MED ORDER — WARFARIN SODIUM 2.5 MG PO TABS
2.5000 mg | ORAL_TABLET | Freq: Once | ORAL | Status: AC
  Administered 2015-02-24: 2.5 mg via ORAL
  Filled 2015-02-24: qty 1

## 2015-02-24 MED ORDER — WARFARIN - PHARMACIST DOSING INPATIENT
Freq: Every day | Status: DC
  Administered 2015-02-24 – 2015-02-26 (×3)

## 2015-02-24 MED ORDER — FUROSEMIDE 10 MG/ML IJ SOLN
40.0000 mg | Freq: Once | INTRAMUSCULAR | Status: AC
  Administered 2015-02-24: 40 mg via INTRAVENOUS
  Filled 2015-02-24: qty 4

## 2015-02-24 MED ORDER — CARVEDILOL 3.125 MG PO TABS
3.1250 mg | ORAL_TABLET | Freq: Two times a day (BID) | ORAL | Status: DC
  Administered 2015-02-24: 3.125 mg via ORAL
  Filled 2015-02-24 (×2): qty 1

## 2015-02-24 MED ORDER — SACCHAROMYCES BOULARDII 250 MG PO CAPS
250.0000 mg | ORAL_CAPSULE | Freq: Two times a day (BID) | ORAL | Status: DC
  Administered 2015-02-24 – 2015-02-27 (×6): 250 mg via ORAL
  Filled 2015-02-24 (×7): qty 1

## 2015-02-24 MED ORDER — SODIUM CHLORIDE 0.9 % IJ SOLN
3.0000 mL | Freq: Two times a day (BID) | INTRAMUSCULAR | Status: DC
Start: 2015-02-24 — End: 2015-02-27
  Administered 2015-02-24 – 2015-02-27 (×6): 3 mL via INTRAVENOUS

## 2015-02-24 MED ORDER — VANCOMYCIN 50 MG/ML ORAL SOLUTION
125.0000 mg | Freq: Four times a day (QID) | ORAL | Status: DC
  Administered 2015-02-24 – 2015-02-27 (×12): 125 mg via ORAL
  Filled 2015-02-24 (×16): qty 2.5

## 2015-02-24 MED ORDER — LEVOTHYROXINE SODIUM 88 MCG PO TABS
88.0000 ug | ORAL_TABLET | Freq: Every day | ORAL | Status: DC
  Administered 2015-02-25 – 2015-02-27 (×3): 88 ug via ORAL
  Filled 2015-02-24 (×4): qty 1

## 2015-02-24 MED ORDER — ALPRAZOLAM 0.5 MG PO TABS
0.5000 mg | ORAL_TABLET | Freq: Three times a day (TID) | ORAL | Status: DC | PRN
  Administered 2015-02-24 – 2015-02-25 (×2): 0.5 mg via ORAL
  Filled 2015-02-24 (×2): qty 1

## 2015-02-24 MED ORDER — GI COCKTAIL ~~LOC~~
30.0000 mL | Freq: Once | ORAL | Status: AC
  Administered 2015-02-24: 30 mL via ORAL
  Filled 2015-02-24: qty 30

## 2015-02-24 MED FILL — Heparin Sodium (Porcine) 2 Unit/ML in Sodium Chloride 0.9%: INTRAMUSCULAR | Qty: 500 | Status: AC

## 2015-02-24 NOTE — Progress Notes (Signed)
[facility]  Patient Status: Active up until rehospitalization   [facility] is providing the following services: SN, PT, OT, SW, HA   If patient discharges after hours, please call [phone].   Fara Chute 02/24/2015, 3:12 PM

## 2015-02-24 NOTE — ED Notes (Signed)
Attempted report. Nurse states unable to take.

## 2015-02-24 NOTE — H&P (Signed)
[facility] History and Physical  [name] ZOX:[id number] DOB: 1935/10/04 DOA: 02/24/2015  Referring physician:  Chaney Malling PCP:  Ezequiel Kayser, MD   Chief Complaint:  SOB, weakness  HPI:  The patient is a 80 y.o. year-old male with history of coronary artery disease status post CABG 5 vessels, ischemic cardiomyopathy with systolic heart failure with an ejection fraction of 20% on an echocardiogram done in 02/18/2015, atrial fibrillation on anticoagulation, hypertension, hyperlipidemia, GERD, history of repeatedly leaving AGAINST MEDICAL ADVICE and refusing skilled nursing facility for rehabilitation. He was recently admitted with complete heart block for which he had a pacemaker placed and C. difficile diarrhea. He was recommended to go to skilled nursing facility however he declined and was discharged home on 5/22 with home health services. He is returning the following day with weakness and shortness of breath.  He states that he went home able to ambulate but last night, he got up to go to the bathroom and sunk to his knees.  He had to crawl to the bathroom and he was eventually able to reach his daughter to assist him.  He has had increased SOB with PND and possible mild orthopnea.  His legs have been more swollen than usual.  He denies fevers, chills, nausea, vomiting, abdominal pain.  He had one applesauce consistency stool yesterday evening and none since.     In the emergency department, his vital signs were notable for hypoxia to the mid 80s on RA. His labs are notable for a mildly increased BNP up to 1936 up from 1599 on 5/16.  He has baseline chronic anemia, INR therapeutic, negative troponin with stable EKG status post pacemaker. His chest x-ray was read as unchanged however upon reviewing his previous chest x-rays and looking at the images myself, he appears to have some pulmonary vascular congestion and fluid in the fissure and may be some mild pulmonary edema even consistent  with some mild acute on chronic congestive heart failure.  He has not yet been weighed today in the emergency department. It appears that his weight has been around 92.5-92.2 kg recently which may be slightly hypervolemic for him.    Review of Systems:  General:  Denies fevers, chills, weight loss or gain HEENT:  Denies changes to hearing and vision, rhinorrhea, sinus congestion, sore throat CV:  Denies chest pain and palpitations, positive lower extremity edema.  PULM:  Denies SOB, wheezing, cough.   GI:  Per history of present illness  GU:  Denies dysuria, frequency, urgency ENDO:  Denies polyuria, polydipsia.   HEME:  Denies hematemesis, blood in stools, melena, abnormal bruising or bleeding.  LYMPH:  Denies lymphadenopathy.   MSK:  Denies arthralgias, myalgias.   DERM:  Denies skin rash or ulcer.   NEURO:  Diffuse weakness but no focal numbness, weakness, slurred speech, confusion, facial droop.  PSYCH:  Denies anxiety and depression.    Past Medical History  Diagnosis Date  . Ischemic cardiomyopathy     a. prior CRTD implantation with subcutaneous array b. s/p device system extraction 2/2 infection  . VT (ventricular tachycardia)     a. s/p ablation  . Anxiety   . Depression   . Permanent atrial fibrillation   . Hyperlipidemia   . Gynecomastia   . BPH (benign prostatic hypertrophy)   . Diabetes mellitus   . Hypertension   . Degenerative joint disease   . GERD (gastroesophageal reflux disease)   . CAD (coronary artery disease)  a. s/p CABG   Past Surgical History  Procedure Laterality Date  . Cardiac defibrillator removal    . Coronary artery bypass graft  2000    X5  . Cardiac catheterization  12/25/2008    THE LEFT VENTRICLE WAS ENLARGED. THERE IS ANTERIOR APICAL AND INFERIOR APICAL AKINESIA. EF ESTIMATED 20%. NO MITRAL REGURGITATION.  . Cardiac catheterization  12/2008    REPEAT CATH, SHOWED BYPASS GRAFTS WERE PATENT  . US echocardiography  2007    EF 20 to 25%   . Ablation of dysrhythmic focus    . Pars plana vitrectomy  09/24/2011    Procedure: PARS PLANA VITRECTOMY WITH 25 GAUGE;  Surgeon: Alford Highland Rankin;  Location: [facility] OR;  Service: Ophthalmology;  Laterality: Left;  MEMBRANE PEEL, SILICONE OIL  . Cardioversion N/A 11/20/2012    Procedure: CARDIOVERSION;  Surgeon: Pricilla Riffle, MD;  Location: [facility] ENDOSCOPY;  Service: Cardiovascular;  Laterality: N/A;  . Cardioversion N/A 12/28/2012    Procedure: CARDIOVERSION;  Surgeon: Cassell Clement, MD;  Location: [facility] ENDOSCOPY;  Service: Cardiovascular;  Laterality: N/A;  . Cataract extraction, bilateral    . Ep implantable device N/A 02/21/2015    Procedure: Pacemaker Implant;  Surgeon: Hillis Range, MD;  Location: [facility] INVASIVE CV LAB;  Service: Cardiovascular;  Laterality: N/A;   Social History:  reports that he has never smoked. He has never used smokeless tobacco. He reports that he does not drink alcohol or use illicit drugs. From home alone  No Known Allergies  Family History  Problem Relation Age of Onset  . Heart failure Mother   . Heart disease    . Heart failure Sister   . Heart failure Father      Prior to Admission medications   Medication Sig Start Date End Date Taking? Authorizing Provider  ALPRAZolam Prudy Feeler) 0.5 MG tablet Take 0.5 mg by mouth 3 (three) times daily as needed for anxiety.  02/10/15  Yes Historical Provider, MD  amiodarone (PACERONE) 200 MG tablet Take 1/2 tablet (100 mg total) by mouth 5 days a week. Patient taking differently: Take 100 mg by mouth daily.  05/03/14  Yes Duke Salvia, MD  finasteride (PROSCAR) 5 MG tablet Take 5 mg by mouth daily.  02/02/15  Yes Historical Provider, MD  lactose free nutrition (BOOST) LIQD Take 237 mLs by mouth 3 (three) times daily between meals.   Yes Historical Provider, MD  levothyroxine (SYNTHROID, LEVOTHROID) 125 MCG tablet Take 125 mcg by mouth daily. 01/25/15  Yes Historical Provider, MD  losartan (COZAAR) 50 MG tablet Take 0.5 tablets (25 mg  total) by mouth daily. 08/14/14  Yes Duke Salvia, MD  saccharomyces boulardii (FLORASTOR) 250 MG capsule Take 1 capsule (250 mg total) by mouth 2 (two) times daily. 02/23/15  Yes Rodolph Bong, MD  vancomycin (VANCOCIN) 50 mg/mL oral solution Take 2.5 mLs (125 mg total) by mouth 4 (four) times daily. Take for 12 days then stop. 02/23/15  Yes Rodolph Bong, MD  warfarin (COUMADIN) 1 MG tablet Take 1 tablet (1 mg total) by mouth daily. Take until seen in coumadin clinic. 02/24/15   Rodolph Bong, MD   Physical Exam: Filed Vitals:   02/24/15 1315 02/24/15 1330 02/24/15 1345 02/24/15 1400  BP: 160/85 146/104 161/57 164/84  Pulse: 68 59 69 71  Temp:      TempSrc:      Resp: SpO2: 91% 87% 93% 96%    General:  adult male,  no acute distress, lying flat on stretcher oxygen saturations occasionally dipping down into the mid 80s    Eyes:  PERRL, anicteric, non-injected.  ENT:  Nares clear.  OP clear, non-erythematous without plaques or exudates.  MMM.  Neck:  Supple without TM or JVD.    Lymph:  No cervical, supraclavicular, or submandibular LAD.  Cardiovascular:  Distant heart sounds, RR, normal S1, S2, possible gallop.  2+ pulses, warm extremities  Respiratory:  Rales at the left base, no wheezes or rhonchi, otherwise clear, without increased WOB.  Abdomen:  NABS.  Soft, nondistended, mild tenderness to palpation in the left upper quadrant without rebound or guarding  Skin:  No rashes or focal lesions.  Musculoskeletal:  Normal bulk and tone. 2+ soft pitting bilateral left slightly worse than right LE edema.  Psychiatric:  A & O x 4.  Appropriate affect.  Neurologic:  CN 3-12 grossly intact.  5/5 strength.  Sensation intact.  Labs on Admission:  Basic Metabolic Panel:  Recent Labs Lab 02/19/15 0346 02/20/15 1245 02/21/15 1208 02/22/15 0900 02/23/15 0425 02/24/15 1129  NA 137 138  --  135 137 136  K 4.7 4.5  --  4.4 4.7 4.3  CL 103 103  --  101  103 102  CO2 27 26  --  24 28 26   GLUCOSE 126* 161*  --  151* 104* 113*  BUN 45* 45*  --  31* 25* 22*  CREATININE 1.54* 1.53*  --  1.32* 1.14 1.20  CALCIUM 8.5* 8.8*  --  8.3* 8.4* 8.5*  MG  --   --  2.4  --   --   --    Liver Function Tests:  Recent Labs Lab 02/20/15 1245 02/22/15 0900 02/24/15 1129  AST 31 20 25   ALT 66* 39 23  ALKPHOS 97 76 69  BILITOT 1.7* 1.8* 2.1*  PROT 6.4* 5.5* 5.7*  ALBUMIN 3.4* 2.8* 3.1*    Recent Labs Lab 02/24/15 1129  LIPASE 28   No results for input(s): AMMONIA in the last 168 hours. CBC:  Recent Labs Lab 02/19/15 0346 02/20/15 1245 02/22/15 0900 02/23/15 0425 02/24/15 1129  WBC 6.3 9.0 9.2 7.4 8.3  NEUTROABS  --  6.7  --   --  7.2  HGB 11.0* 11.7* 10.8* 10.8* 11.1*  HCT 35.4* 37.8* 35.1* 33.4* 35.0*  MCV 91.9 93.8 90.7 91.8 89.5  PLT 287 341 240 192 188   Cardiac Enzymes:  Recent Labs Lab 02/20/15 1245 02/21/15 0015  TROPONINI 0.03 0.05*    BNP (last 3 results)  Recent Labs  02/17/15 0400 02/24/15 1129  BNP 1599.2* 1936.4*    ProBNP (last 3 results) No results for input(s): PROBNP in the last 8760 hours.  CBG:  Recent Labs Lab 02/21/15 1627 02/21/15 2120 02/22/15 1211 02/22/15 1628 02/22/15 2212  GLUCAP 126* 141* 113* 113* 129*    Radiological Exams on Admission: Dg Chest Port 1 View  02/24/2015   CLINICAL DATA:  Weakness, left lower quadrant pain  EXAM: PORTABLE CHEST - 1 VIEW  COMPARISON:  02/22/2015  FINDINGS: Cardiomegaly again noted. Status post median sternotomy. No acute infiltrate or pleural effusion. No pulmonary edema.  IMPRESSION: No active disease.   Electronically Signed   By: Natasha Mead M.D.   On: 02/24/2015 12:16   Dg Abd Portable 1v  02/24/2015   CLINICAL DATA:  Lower abdominal pain and weakness  EXAM: PORTABLE ABDOMEN - 1 VIEW  COMPARISON:  None.  FINDINGS: Bowel gas pattern unremarkable.  No obstruction or free air is seen on this supine examination. There is contrast in the rectum.  There are vascular calcifications in the pelvis.  IMPRESSION: Bowel gas pattern unremarkable.   Electronically Signed   By: Bretta Bang III M.D.   On: 02/24/2015 12:18    EKG: Independently reviewed. Ventricularly paced rhythm, ST segment elevation or rounding in the lateral and inferior leads  Assessment/Plan Active Problems:   Ischemic cardiomyopathy   SYSTOLIC HEART FAILURE, ACUTE ON CHRONIC   Atrial fibrillation   C. difficile colitis   CHF (congestive heart failure)  ---  Generalized weakness, may be secondary to some mild acute on chronic systolic heart failure and C. Diff colitis -  Diuresis as below -  Ongoing physical and occupational therapy -  Social worker to talk to the patient again about skilled nursing facility  Acute respiratory failure on chronic systolic and diastolic CHF exacerbation.   -  Telemetry -  Daily weights and strict I/O -  Thyroid testing as below -  Cycle troponins -  ECHO recently done and will not repeat  -  Continue ARB -  Not on beta blocker due to recent complete heart block > defer to cardiology -  Lasix per cardiology -  2 gm sodium diet with 1.2L fluid restriction -  Wean oxygen as tolerated -  Nursing staff to provide education about CHF  Hypothyroidism, TSH 0.047, fT4 3.33, fT3 2.5 on 02/16/2015 c/w too much synthroid -  Decrease synthroid to 88 mcg -  Needs repeat TSH in about 2-3 weeks  Paroxysmal atrial fibrillation and recent complete heart block s/p PPM/ICD placement -  CHADs2vasc = 5  -  HASBLED = 2  -  Continue amiodarone -  Warfarin per pharmacy  CAD, patient is chest pain free -  Not on ASA as on warfarin due to bleeding risk -  Will add statin -  Beta blocker per cardiology  C. Diff diarrhea -  Continue oral vancomycin  BPH stable, continue finasteride  Anxiety, stable, continue prn xanax  Severe protein calorie malnutrition -  Liberalize diet and add supplements -  Nutrition consultation  Diet:  Low  sodium Access:  PIV IVF:  off Proph:  warfarin  Code Status: DNR Family Communication: patient and his daughter Disposition Plan: Admit to telemetry  Time spent: 60 min Renae Fickle [facility] Pager [phone]  If 7PM-7AM, please contact night-coverage [email or web address] Password [email or web address] 02/24/2015, 2:30 PM

## 2015-02-24 NOTE — ED Notes (Signed)
Admitting at the bedside.  

## 2015-02-24 NOTE — ED Notes (Signed)
Pt here for weakness. sts he woke up this way. sts he could barely walk. Pt recent pacemaker insertion. Denies chest pain, pt pale.

## 2015-02-24 NOTE — Progress Notes (Signed)
ANTICOAGULATION CONSULT NOTE - Initial Consult  Pharmacy Consult for Coumadin Indication: atrial fibrillation  No Known Allergies  Patient Measurements: Height: 5' 10.5" (179.1 cm) Weight: 200 lb 2.8 oz (90.8 kg) IBW/kg (Calculated) : 74.15 Heparin Dosing Weight:    Vital Signs: Temp: 97.9 F (36.6 C) (05/23 1502) Temp Source: Oral (05/23 1502) BP: 159/64 mmHg (05/23 1502) Pulse Rate: 59 (05/23 1502)  Labs:  Recent Labs  02/22/15 0900 02/23/15 0425 02/24/15 1129  HGB 10.8* 10.8* 11.1*  HCT 35.1* 33.4* 35.0*  PLT 240 192 188  LABPROT 32.2* 31.3* 23.8*  INR 3.21* 3.09* 2.14*  CREATININE 1.32* 1.14 1.20    Estimated Creatinine Clearance: 58.9 mL/min (by C-G formula based on Cr of 1.2).   Medical History: Past Medical History  Diagnosis Date  . Ischemic cardiomyopathy     a. prior CRTD implantation with subcutaneous array b. s/p device system extraction 2/2 infection  . VT (ventricular tachycardia)     a. s/p ablation  . Anxiety   . Depression   . Permanent atrial fibrillation   . Hyperlipidemia   . Gynecomastia   . BPH (benign prostatic hypertrophy)   . Diabetes mellitus   . Hypertension   . Degenerative joint disease   . GERD (gastroesophageal reflux disease)   . CAD (coronary artery disease)     a. s/p CABG    Medications:  Prescriptions prior to admission  Medication Sig Dispense Refill Last Dose  . ALPRAZolam (XANAX) 0.5 MG tablet Take 0.5 mg by mouth 3 (three) times daily as needed for anxiety.    Past Week at Unknown time  . amiodarone (PACERONE) 200 MG tablet Take 1/2 tablet (100 mg total) by mouth 5 days a week. (Patient taking differently: Take 100 mg by mouth daily. ) 15 tablet 6 Past Week at Unknown time  . finasteride (PROSCAR) 5 MG tablet Take 5 mg by mouth daily.    Past Week at Unknown time  . lactose free nutrition (BOOST) LIQD Take 237 mLs by mouth 3 (three) times daily between meals.   Past Week at Unknown time  . levothyroxine  (SYNTHROID, LEVOTHROID) 125 MCG tablet Take 125 mcg by mouth daily.   Past Week at Unknown time  . losartan (COZAAR) 50 MG tablet Take 0.5 tablets (25 mg total) by mouth daily. 30 tablet 6 Past Week at Unknown time  . saccharomyces boulardii (FLORASTOR) 250 MG capsule Take 1 capsule (250 mg total) by mouth 2 (two) times daily.   Past Week at Unknown time  . vancomycin (VANCOCIN) 50 mg/mL oral solution Take 2.5 mLs (125 mg total) by mouth 4 (four) times daily. Take for 12 days then stop. 120 mL 0 Past Week at Unknown time  . warfarin (COUMADIN) 1 MG tablet Take 1 tablet (1 mg total) by mouth daily. Take until seen in coumadin clinic. 30 tablet 0     Assessment: 80 y.o. male presents with weakness, SOB. Pt was just discharged home 5/22 after pacemaker placement on 5/20. During this hospitalization he also had diarrhea and was positive for cdiff. Originally treated with flagyl (5/20-5/21) but due to interaction between coumadin and flagyl, pt was switched to po vancomycin.   Anticoagulation: Coumadin PTA (2.5mg  daily) for afib. INR was supratherapeutic during part of last admit so plan was to send home on 1mg  daily with recheck in 2-3 days. INR down 3.09>>2.14 today. (Last dose 5mg  on 5/16, 5/18 had 1mg  IV Vitamin K, last dose of Flagyl 5/21).  Goal of  Therapy:  Heparin level 0.3-0.7 units/ml Monitor platelets by anticoagulation protocol: Yes   Plan:  Coumadin 2.5mg  po x 1 tonight. Daily INR   [name] S. Merilynn Finland, PharmD, BCPS Clinical Staff Pharmacist Pager [phone]  [name] 02/24/2015,3:43 PM

## 2015-02-24 NOTE — Progress Notes (Signed)
ANTIBIOTIC CONSULT NOTE - INITIAL  Pharmacy Consult for po vancomycin Indication: Cdiff  No Known Allergies  Patient Measurements:    Vital Signs: Temp: 99.1 F (37.3 C) (05/23 1122) Temp Source: Rectal (05/23 1122) BP: 161/69 mmHg (05/23 1300) Pulse Rate: 65 (05/23 1300) Intake/Output from previous day:   Intake/Output from this shift:    Labs:  Recent Labs  02/22/15 0900 02/23/15 0425 02/24/15 1129  WBC 9.2 7.4 8.3  HGB 10.8* 10.8* 11.1*  PLT 240 192 188  CREATININE 1.32* 1.14 1.20   Estimated Creatinine Clearance: 57.8 mL/min (by C-G formula based on Cr of 1.2). No results for input(s): VANCOTROUGH, VANCOPEAK, VANCORANDOM, GENTTROUGH, GENTPEAK, GENTRANDOM, TOBRATROUGH, TOBRAPEAK, TOBRARND, AMIKACINPEAK, AMIKACINTROU, AMIKACIN in the last 72 hours.   Microbiology: Recent Results (from the past 720 hour(s))  Clostridium Difficile by PCR     Status: None   Collection Time: 02/14/15  8:15 PM  Result Value Ref Range Status   C difficile by pcr NEGATIVE NEGATIVE Final  MRSA PCR Screening     Status: None   Collection Time: 02/14/15 11:10 PM  Result Value Ref Range Status   MRSA by PCR NEGATIVE NEGATIVE Final    Comment:        The GeneXpert MRSA Assay (FDA approved for NASAL specimens only), is one component of a comprehensive MRSA colonization surveillance program. It is not intended to diagnose MRSA infection nor to guide or monitor treatment for MRSA infections.   Culture, Urine     Status: None   Collection Time: 02/16/15  5:00 PM  Result Value Ref Range Status   Specimen Description URINE, CLEAN CATCH  Final   Special Requests NONE  Final   Colony Count   Final    6,000 COLONIES/ML Performed at [facility]    Culture   Final    INSIGNIFICANT GROWTH Performed at [facility]    Report Status 02/17/2015 FINAL  Final  Clostridium Difficile by PCR     Status: Abnormal   Collection Time: 02/21/15 10:25 AM  Result Value Ref Range  Status   C difficile by pcr POSITIVE (A) NEGATIVE Final    Comment: CRITICAL RESULT CALLED TO, READ BACK BY AND VERIFIED WITH: T. FAIRING RN 11:40 02/21/15 ([email or web address])     Medical History: Past Medical History  Diagnosis Date  . Ischemic cardiomyopathy     a. prior CRTD implantation with subcutaneous array b. s/p device system extraction 2/2 infection  . VT (ventricular tachycardia)     a. s/p ablation  . Anxiety   . Depression   . Permanent atrial fibrillation   . Hyperlipidemia   . Gynecomastia   . BPH (benign prostatic hypertrophy)   . Diabetes mellitus   . Hypertension   . Degenerative joint disease   . GERD (gastroesophageal reflux disease)   . CAD (coronary artery disease)     a. s/p CABG    Medications:  See electronic med rec  Assessment: 80 y.o. male presents with weakness, SOB. Pt was just discharged home 5/22 after pacemaker placement on 5/20. During this hospitalization he also had diarrhea and was positive for cdiff. Originally treated with flagyl (5/20-5/21) but due to interaction between coumadin and flagyl, pt was switched to po vancomycin. Plan to complete 14 day vancomycin course (start date 5/22).   Goal of Therapy:  Cdiff treatment  Plan:  Vancomycin  po q6h x 12 more days (total of 14 days) Will f/u restart of coumadin while pt in hospital  Christoper Fabian, PharmD, BCPS Clinical pharmacist, pager [phone] 02/24/2015,1:25 PM

## 2015-02-24 NOTE — Consult Note (Signed)
   [facility] Inpatient Consult   02/24/2015  [name] Dec 31, 1934 [id number]  Chart encounter and notification from [facility] [name] that the patient has returned to the hospital discharged 02/23/15 per [facility] notes.  Alerted inpatient RNCM, Jiles Crocker, via voicemail of patient's status.  Will continue to follow up for needs.  Chart encounter reviewed as well.  [facility] Care Management will continue to follow.  For questions, please contact: Charlesetta Shanks, RN BSN CCM [facility]  [phone] business mobile phone

## 2015-02-24 NOTE — Progress Notes (Signed)
Pt AOx4. Pt instructed of high risk fall potential while hospitalized. Pt accepted bed alarm precaution but wishes to "get up whenever he wishes whether we are there or not". Will continue to utilize fall risk precautions with pt.    Jilda Panda RN

## 2015-02-24 NOTE — Progress Notes (Signed)
Attempted to call report. RN states she is ambulating another pt and will call back.

## 2015-02-24 NOTE — Patient Outreach (Signed)
02/24/15- Referral received from hospital liason 02/21/15, pt discharged yesterday 02/23/15. Telephone call to patient for transition of care week 1, no answer to home telephone and no option to leave voicemail,  RN CM called [facility], spoke with Fredia Beets RN who reports they do have referral for pt but did not see pt because pt readmitted to hospital today for complications with C. Difficile, RN CM will continue to follow, sent note to [facility] informing pt readmitted.

## 2015-02-24 NOTE — Consult Note (Signed)
Cardiologist:  Hochrein Reason for Consult:CHF Referring Physician:   RONDO Cook is an 80 y.o. male.  HPI:   The patient is a 80 yo male with a history of CAD, CABGx5 ~1996,  ISCM, VT, permanent, afib-on coumadin, HLD, DM, HTN, GERD, prior CRTD implant/subcutaneous array with pocket infection s/p device system extraction.  The patient was just discharged on 02/23/15 after being admitted with acute on chronic renal failure, abd pain.  He was in complete heart block and underwent PPM placement.  His last echo was 02/18/15: EF 20%, mild LVH, diffuse hypokinesis, mod MR, Mod TR.  He also has a history of leaving AMA.   He presents with generalized weakness.  He has C. Diff diarrhea diagnosed during the last admission.  He says he went to the bathroom and fell to his knees because he felt so weak.  He says he did not get out of bed at all during previous admission.  He denies orthopnea or PND.  His left leg swells ever since his CABG.   BNP is 1936.4.  Paced rhythm on EKG.  He says he has had some blood in his stool.  No melena.  The patient currently denies nausea, vomiting, fever, chest pain, shortness of breath, cough, congestion, abdominal pain.     Past Medical History  Diagnosis Date  . Ischemic cardiomyopathy     a. prior CRTD implantation with subcutaneous array b. s/p device system extraction 2/2 infection  . VT (ventricular tachycardia)     a. s/p ablation  . Anxiety   . Depression   . Permanent atrial fibrillation   . Hyperlipidemia   . Gynecomastia   . BPH (benign prostatic hypertrophy)   . Diabetes mellitus   . Hypertension   . Degenerative joint disease   . GERD (gastroesophageal reflux disease)   . CAD (coronary artery disease)     a. s/p CABG    Past Surgical History  Procedure Laterality Date  . Cardiac defibrillator removal    . Coronary artery bypass graft  2000    X5  . Cardiac catheterization  12/25/2008    THE LEFT VENTRICLE WAS ENLARGED. THERE IS ANTERIOR  APICAL AND INFERIOR APICAL AKINESIA. EF ESTIMATED 20%. NO MITRAL REGURGITATION.  . Cardiac catheterization  12/2008    REPEAT CATH, SHOWED BYPASS GRAFTS WERE PATENT  . US echocardiography  2007    EF 20 to 25%  . Ablation of dysrhythmic focus    . Pars plana vitrectomy  09/24/2011    Procedure: PARS PLANA VITRECTOMY WITH 25 GAUGE;  Surgeon: Clent Demark Rankin;  Location: [place] OR;  Service: Ophthalmology;  Laterality: Left;  MEMBRANE PEEL, SILICONE OIL  . Cardioversion N/A 11/20/2012    Procedure: CARDIOVERSION;  Surgeon: Fay Records, MD;  Location: [place];  Service: Cardiovascular;  Laterality: N/A;  . Cardioversion N/A 12/28/2012    Procedure: CARDIOVERSION;  Surgeon: Darlin Coco, MD;  Location: [facility] ENDOSCOPY;  Service: Cardiovascular;  Laterality: N/A;  . Cataract extraction, bilateral    . Ep implantable device N/A 02/21/2015    Procedure: Pacemaker Implant;  Surgeon: Thompson Grayer, MD;  Location: [place] [facility] LAB;  Service: Cardiovascular;  Laterality: N/A;    History reviewed. No pertinent family history.  Social History:  reports that he has never smoked. He has never used smokeless tobacco. He reports that he does not drink alcohol or use illicit drugs.  Allergies: No Known Allergies  Medications:  Medication Sig  ALPRAZolam (XANAX) 0.5 MG  tablet Take 0.5 mg by mouth 3 (three) times daily as needed for anxiety.   amiodarone (PACERONE) 200 MG tablet Take 1/2 tablet (100 mg total) by mouth 5 days a week. Patient taking differently: Take 100 mg by mouth daily.   finasteride (PROSCAR) 5 MG tablet Take 5 mg by mouth daily.   lactose free nutrition (BOOST) LIQD Take 237 mLs by mouth 3 (three) times daily between meals.  levothyroxine (SYNTHROID, LEVOTHROID) 125 MCG tablet Take 125 mcg by mouth daily.  losartan (COZAAR) 50 MG tablet Take 0.5 tablets (25 mg total) by mouth daily.  saccharomyces boulardii (FLORASTOR) 250 MG capsule Take 1 capsule (250 mg total) by mouth 2 (two) times  daily.  vancomycin (VANCOCIN) 50 mg/mL oral solution Take 2.5 mLs (125 mg total) by mouth 4 (four) times daily. Take for 12 days then stop.  warfarin (COUMADIN) 1 MG tablet Take 1 tablet (1 mg total) by mouth daily. Take until seen in coumadin clinic.     Results for orders placed or performed during the hospital encounter of 02/24/15 (from the past 48 hour(s))  CBC with Differential     Status: Abnormal   Collection Time: 02/24/15 11:29 AM  Result Value Ref Range   WBC 8.3 4.0 - 10.5 K/uL   RBC 3.91 (L) 4.22 - 5.81 MIL/uL   Hemoglobin 11.1 (L) 13.0 - 17.0 g/dL   HCT 35.0 (L) 39.0 - 52.0 %   MCV 89.5 78.0 - 100.0 fL   MCH 28.4 26.0 - 34.0 pg   MCHC 31.7 30.0 - 36.0 g/dL   RDW 15.3 11.5 - 15.5 %   Platelets 188 150 - 400 K/uL   Neutrophils Relative % 86 (H) 43 - 77 %   Neutro Abs 7.2 1.7 - 7.7 K/uL   Lymphocytes Relative 5 (L) 12 - 46 %   Lymphs Abs 0.4 (L) 0.7 - 4.0 K/uL   Monocytes Relative 9 3 - 12 %   Monocytes Absolute 0.7 0.1 - 1.0 K/uL   Eosinophils Relative 0 0 - 5 %   Eosinophils Absolute 0.0 0.0 - 0.7 K/uL   Basophils Relative 0 0 - 1 %   Basophils Absolute 0.0 0.0 - 0.1 K/uL  Comprehensive metabolic panel     Status: Abnormal   Collection Time: 02/24/15 11:29 AM  Result Value Ref Range   Sodium 136 135 - 145 mmol/L   Potassium 4.3 3.5 - 5.1 mmol/L   Chloride 102 101 - 111 mmol/L   CO2 26 22 - 32 mmol/L   Glucose, Bld 113 (H) 65 - 99 mg/dL   BUN 22 (H) 6 - 20 mg/dL   Creatinine, Ser 1.20 0.61 - 1.24 mg/dL   Calcium 8.5 (L) 8.9 - 10.3 mg/dL   Total Protein 5.7 (L) 6.5 - 8.1 g/dL   Albumin 3.1 (L) 3.5 - 5.0 g/dL   AST 25 15 - 41 U/L   ALT 23 17 - 63 U/L   Alkaline Phosphatase 69 38 - 126 U/L   Total Bilirubin 2.1 (H) 0.3 - 1.2 mg/dL   GFR calc non Af Amer 57 (L) >60 mL/min   GFR calc Af Amer >60 >60 mL/min    Comment: (NOTE) The eGFR has been calculated using the CKD EPI equation. This calculation has not been validated in all clinical situations. eGFR's  persistently <60 mL/min signify possible Chronic Kidney Disease.    Anion gap 8 5 - 15  Lipase, blood     Status: None   Collection  Time: 02/24/15 11:29 AM  Result Value Ref Range   Lipase 28 22 - 51 U/L  Brain natriuretic peptide     Status: Abnormal   Collection Time: 02/24/15 11:29 AM  Result Value Ref Range   B Natriuretic Peptide 1936.4 (H) 0.0 - 100.0 pg/mL  Protime-INR     Status: Abnormal   Collection Time: 02/24/15 11:29 AM  Result Value Ref Range   Prothrombin Time 23.8 (H) 11.6 - 15.2 seconds   INR 2.14 (H) 0.00 - 1.49  I-Stat CG4 Lactic Acid, ED     Status: None   Collection Time: 02/24/15 11:48 AM  Result Value Ref Range   Lactic Acid, Venous 1.71 0.5 - 2.0 mmol/L  I-stat troponin, ED     Status: None   Collection Time: 02/24/15 11:59 AM  Result Value Ref Range   Troponin i, poc 0.01 0.00 - 0.08 ng/mL   Comment 3            Comment: Due to the release kinetics of cTnI, a negative result within the first hours of the onset of symptoms does not rule out myocardial infarction with certainty. If myocardial infarction is still suspected, repeat the test at appropriate intervals.     Dg Chest Port 1 View  02/24/2015   CLINICAL DATA:  Weakness, left lower quadrant pain  EXAM: PORTABLE CHEST - 1 VIEW  COMPARISON:  02/22/2015  FINDINGS: Cardiomegaly again noted. Status post median sternotomy. No acute infiltrate or pleural effusion. No pulmonary edema.  IMPRESSION: No active disease.   Electronically Signed   By: Lahoma Crocker M.D.   On: 02/24/2015 12:16   Dg Abd Portable 1v  02/24/2015   CLINICAL DATA:  Lower abdominal pain and weakness  EXAM: PORTABLE ABDOMEN - 1 VIEW  COMPARISON:  None.  FINDINGS: Bowel gas pattern unremarkable. No obstruction or free air is seen on this supine examination. There is contrast in the rectum. There are vascular calcifications in the pelvis.  IMPRESSION: Bowel gas pattern unremarkable.   Electronically Signed   By: Lowella Grip III M.D.    On: 02/24/2015 12:18    Review of Systems  Constitutional: Negative for fever.  HENT: Negative for congestion.   Respiratory: Negative for cough and shortness of breath.   Cardiovascular: Positive for leg swelling (left leg chronic). Negative for chest pain, palpitations, orthopnea and PND.  Gastrointestinal: Positive for diarrhea and blood in stool. Negative for nausea, vomiting, abdominal pain and melena.  Musculoskeletal: Negative for myalgias.  Neurological: Positive for weakness (generalized). Negative for dizziness.  All other systems reviewed and are negative.  Blood pressure 161/69, pulse 65, temperature 99.1 F (37.3 C), temperature source Rectal, resp. rate 18, SpO2 95 %. Physical Exam  Nursing note and vitals reviewed. Constitutional: He is oriented to person, place, and time. He appears well-developed. No distress.  Obese  HENT:  Head: Normocephalic and atraumatic.  Mouth/Throat: No oropharyngeal exudate.  Eyes: EOM are normal. Pupils are equal, round, and reactive to light.  Neck: Normal range of motion. Neck supple.  +Hepatojugular reflux.   Cardiovascular: Normal rate, regular rhythm, S1 normal and S2 normal.  Exam reveals gallop and S3.   No murmur heard. Respiratory: Effort normal and breath sounds normal. He has no wheezes. He has no rales.  GI: Soft. Bowel sounds are normal. He exhibits no distension. There is no tenderness.  Musculoskeletal: He exhibits edema (left LEE).  Lymphadenopathy:    He has no cervical adenopathy.  Neurological: He is  alert and oriented to person, place, and time.  Generally weak.   Skin: Skin is warm and dry.  Psychiatric: He has a normal mood and affect.    Assessment/Plan:   Acute on chronic combined sys/diast CHF (congestive heart failure) I think he only has mild CHF.  EF is 20% and was 15% two years ago.  CXR shows nothing acute.  He is definitely noncompliant with diet amongst other things.  We will put him on daily Lasix  36m PO and start coreg 3.125 twice daily. PPM interrogation.  Statin added by Dr. SSheran Fava  Will check AM lipids(last two years ago).  He looks severely deconditioned.  Recommend PT eval.   SP St Jude single chamber PPM 02/21/15  We will have interrogated.  CAD No CP CABG   HAGER, BRYAN, PAC 02/24/2015, 1:24 PM  As above, patient seen and examined. Briefly he is a 80year old male with a past medical history of coronary artery disease status post coronary artery bypass graft, ischemic cardiomyopathy, permanent atrial fibrillation on Coumadin, and status post recent pacemaker for evaluation of acute on chronic systolic congestive heart failure. The patient presented with complaints of generalized weakness. He denies fevers, chills, productive cough, dyspnea or chest pain. He states he has chronic pedal edema. He apparently did have Clostridium difficile diagnosed during previous admission. However he denies diarrhea. Patient went to the bathroom and fell to his knees because he was so weak and was admitted. Patient also noted to have increased pedal edema and elevated BNP. Cardiology asked to evaluate. Electrocardiogram shows ventricular pacing with underlying atrial fibrillation. Recent pacemaker site without evidence of infection. Patient is mildly volume overloaded on examination. He is not on a diuretic. He was treated with IV Lasix in the emergency room. We will begin 40 mg of Lasix by mouth daily. Follow renal function. Continue ARB. He is not on a beta blocker and I will begin carvedilol 3.125 mg by mouth twice a day. This can be titrated as an outpatient. We will have his device interrogated. He appears to be in underlying atrial fibrillation. Continue Coumadin. If he continues to have difficulty with CHF despite above measures, he could be considered for cardioversion in the future to see if he would hold sinus rhythm and improve his congestive heart failure symptoms. Not clear to me that all of  weakness is secondary to congestive heart failure. BKirk Ruths

## 2015-02-24 NOTE — Telephone Encounter (Signed)
Called pt per [facility] staff message to make coumadin appt with pt today-when I called he said he was going back to the hospital because he doesn't have anyone to wait on him-he lives alone

## 2015-02-24 NOTE — ED Provider Notes (Signed)
CSN: [id number]     Arrival date & time 02/24/15  1059 History   First MD Initiated Contact with Patient 02/24/15 1103     Chief Complaint  Patient presents with  . Weakness     (Consider location/radiation/quality/duration/timing/severity/associated sxs/prior Treatment) The history is provided by the patient.  [name] is a 80 y.o. male history of ischemic cardiomyopathy, ventricular tachycardia, A. fib on Coumadin, here presenting with shortness of breath, weakness. Patient was recently admitted and had a pacemaker placed on 5/20 for bradycardia. Patient was also diagnosed with C. difficile colitis and started on PO vanc. Patient went home yesterday. Patient said he is weak and persistently short of breath and has some abdominal pain. One episode loose stool as well but no vomiting. He states that he feels warm but didn't check his temperature.    Past Medical History  Diagnosis Date  . Ischemic cardiomyopathy     a. prior CRTD implantation with subcutaneous array b. s/p device system extraction 2/2 infection  . VT (ventricular tachycardia)     a. s/p ablation  . Anxiety   . Depression   . Permanent atrial fibrillation   . Hyperlipidemia   . Gynecomastia   . BPH (benign prostatic hypertrophy)   . Diabetes mellitus   . Hypertension   . Degenerative joint disease   . GERD (gastroesophageal reflux disease)   . CAD (coronary artery disease)     a. s/p CABG   Past Surgical History  Procedure Laterality Date  . Cardiac defibrillator removal    . Coronary artery bypass graft  2000    X5  . Cardiac catheterization  12/25/2008    THE LEFT VENTRICLE WAS ENLARGED. THERE IS ANTERIOR APICAL AND INFERIOR APICAL AKINESIA. EF ESTIMATED 20%. NO MITRAL REGURGITATION.  . Cardiac catheterization  12/2008    REPEAT CATH, SHOWED BYPASS GRAFTS WERE PATENT  . US echocardiography  2007    EF 20 to 25%  . Ablation of dysrhythmic focus    . Pars plana vitrectomy  09/24/2011    Procedure:  PARS PLANA VITRECTOMY WITH 25 GAUGE;  Surgeon: Alford Highland Rankin;  Location: [facility] OR;  Service: Ophthalmology;  Laterality: Left;  MEMBRANE PEEL, SILICONE OIL  . Cardioversion N/A 11/20/2012    Procedure: CARDIOVERSION;  Surgeon: Pricilla Riffle, MD;  Location: [facility] ENDOSCOPY;  Service: Cardiovascular;  Laterality: N/A;  . Cardioversion N/A 12/28/2012    Procedure: CARDIOVERSION;  Surgeon: Cassell Clement, MD;  Location: [facility] ENDOSCOPY;  Service: Cardiovascular;  Laterality: N/A;  . Cataract extraction, bilateral    . Ep implantable device N/A 02/21/2015    Procedure: Pacemaker Implant;  Surgeon: Hillis Range, MD;  Location: [facility] INVASIVE CV LAB;  Service: Cardiovascular;  Laterality: N/A;   History reviewed. No pertinent family history. History  Substance Use Topics  . Smoking status: Never Smoker   . Smokeless tobacco: Never Used  . Alcohol Use: No     Comment: hx of in younger years, not now    Review of Systems  Neurological: Positive for weakness.  All other systems reviewed and are negative.     Allergies  Review of patient's allergies indicates no known allergies.  Home Medications   Prior to Admission medications   Medication Sig Start Date End Date Taking? Authorizing Provider  ALPRAZolam Prudy Feeler) 0.5 MG tablet Take 0.5 mg by mouth 3 (three) times daily as needed for anxiety.  02/10/15  Yes Historical Provider, MD  amiodarone (PACERONE) 200 MG tablet Take 1/2 tablet (  100 mg total) by mouth 5 days a week. Patient taking differently: Take 100 mg by mouth daily.  05/03/14  Yes Duke Salvia, MD  finasteride (PROSCAR) 5 MG tablet Take 5 mg by mouth daily.  02/02/15  Yes Historical Provider, MD  lactose free nutrition (BOOST) LIQD Take 237 mLs by mouth 3 (three) times daily between meals.   Yes Historical Provider, MD  levothyroxine (SYNTHROID, LEVOTHROID) 125 MCG tablet Take 125 mcg by mouth daily. 01/25/15  Yes Historical Provider, MD  losartan (COZAAR) 50 MG tablet Take 0.5 tablets (25 mg  total) by mouth daily. 08/14/14  Yes Duke Salvia, MD  saccharomyces boulardii (FLORASTOR) 250 MG capsule Take 1 capsule (250 mg total) by mouth 2 (two) times daily. 02/23/15  Yes Rodolph Bong, MD  vancomycin (VANCOCIN) 50 mg/mL oral solution Take 2.5 mLs (125 mg total) by mouth 4 (four) times daily. Take for 12 days then stop. 02/23/15  Yes Rodolph Bong, MD  warfarin (COUMADIN) 1 MG tablet Take 1 tablet (1 mg total) by mouth daily. Take until seen in coumadin clinic. 02/24/15   Rodolph Bong, MD   BP 160/70 mmHg  Pulse 67  Temp(Src) 99.1 F (37.3 C) (Rectal)  Resp 21  SpO2 94% Physical Exam  Constitutional: He is oriented to person, place, and time.  Chronically ill, dehydrated   HENT:  Head: Normocephalic.  MM slightly dry   Eyes: Conjunctivae are normal. Pupils are equal, round, and reactive to light.  Neck: Normal range of motion. Neck supple.  Cardiovascular: Normal rate, regular rhythm and normal heart sounds.   Pulmonary/Chest: Effort normal.  Diminished bilateral bases   Abdominal: Soft. Bowel sounds are normal.  Mild LLQ tenderness, no rebound.   Musculoskeletal: Normal range of motion. He exhibits no edema or tenderness.  Neurological: He is alert and oriented to person, place, and time. No cranial nerve deficit. Coordination normal.  Skin: Skin is warm and dry.  Psychiatric: He has a normal mood and affect. His behavior is normal. Judgment and thought content normal.  Nursing note and vitals reviewed.   ED Course  Procedures (including critical care time) Labs Review Labs Reviewed  CBC WITH DIFFERENTIAL/PLATELET - Abnormal; Notable for the following:    RBC 3.91 (*)    Hemoglobin 11.1 (*)    HCT 35.0 (*)    Neutrophils Relative % 86 (*)    Lymphocytes Relative 5 (*)    Lymphs Abs 0.4 (*)    All other components within normal limits  COMPREHENSIVE METABOLIC PANEL - Abnormal; Notable for the following:    Glucose, Bld 113 (*)    BUN 22 (*)     Calcium 8.5 (*)    Total Protein 5.7 (*)    Albumin 3.1 (*)    Total Bilirubin 2.1 (*)    GFR calc non Af Amer 57 (*)    All other components within normal limits  BRAIN NATRIURETIC PEPTIDE - Abnormal; Notable for the following:    B Natriuretic Peptide 1936.4 (*)    All other components within normal limits  PROTIME-INR - Abnormal; Notable for the following:    Prothrombin Time 23.8 (*)    INR 2.14 (*)    All other components within normal limits  CLOSTRIDIUM DIFFICILE BY PCR  CULTURE, BLOOD (ROUTINE X 2)  CULTURE, BLOOD (ROUTINE X 2)  LIPASE, BLOOD  I-STAT TROPOININ, ED  I-STAT CG4 LACTIC ACID, ED    Imaging Review Dg Chest [facility] 1 [address]  02/24/2015   CLINICAL DATA:  Weakness, left lower quadrant pain  EXAM: PORTABLE CHEST - 1 VIEW  COMPARISON:  02/22/2015  FINDINGS: Cardiomegaly again noted. Status post median sternotomy. No acute infiltrate or pleural effusion. No pulmonary edema.  IMPRESSION: No active disease.   Electronically Signed   By: Natasha Mead M.D.   On: 02/24/2015 12:16   Dg Abd Portable 1v  02/24/2015   CLINICAL DATA:  Lower abdominal pain and weakness  EXAM: PORTABLE ABDOMEN - 1 VIEW  COMPARISON:  None.  FINDINGS: Bowel gas pattern unremarkable. No obstruction or free air is seen on this supine examination. There is contrast in the rectum. There are vascular calcifications in the pelvis.  IMPRESSION: Bowel gas pattern unremarkable.   Electronically Signed   By: Bretta Bang III M.D.   On: 02/24/2015 12:18     EKG Interpretation   Date/Time:  Monday Feb 24 2015 11:23:02 EDT Ventricular Rate:  60 PR Interval:    QRS Duration: 241 QT Interval:  558 QTC Calculation: 558 R Axis:   -86 Text Interpretation:  Junctional rhythm Nonspecific IVCD with LAD Left  ventricular hypertrophy paced rhythm Confirmed by Jersee Winiarski  MD, Greidys Deland ([id number])  on 02/24/2015 11:29:17 AM      MDM   Final diagnoses:  Weakness   [name] is a 80 y.o. male here with weakness, low grade  temp, diarrhea. Concerned for recurrent c diff vs other reasons for sepsis vs complication from recent pacemaker placement.   11: 30 AM EKG showed wide complex rhythm, ? STEMI but appears paced. Called Dr. Katrinka Blazing who doesn't think its STEMI, likely paced rhythm. Sepsis workup initiated.   1:00 PM WBC nl. Cr at baseline. BNP increased to 1900. Given lasix. Cardiology consulted for CHF. Will admit to tele on hospitalist service    Richardean Canal, MD 02/24/15 1301

## 2015-02-24 NOTE — ED Notes (Signed)
NT Heather and this nurse attempted to weigh patient. Patient family states patient is unable to stand.  Admitting MD at  Bedside and notified.

## 2015-02-25 DIAGNOSIS — R531 Weakness: Secondary | ICD-10-CM | POA: Insufficient documentation

## 2015-02-25 DIAGNOSIS — I5023 Acute on chronic systolic (congestive) heart failure: Secondary | ICD-10-CM | POA: Insufficient documentation

## 2015-02-25 LAB — BASIC METABOLIC PANEL
Anion gap: 8 (ref 5–15)
BUN: 22 mg/dL — ABNORMAL HIGH (ref 6–20)
CHLORIDE: 102 mmol/L (ref 101–111)
CO2: 24 mmol/L (ref 22–32)
CREATININE: 1.29 mg/dL — AB (ref 0.61–1.24)
Calcium: 8.8 mg/dL — ABNORMAL LOW (ref 8.9–10.3)
GFR calc non Af Amer: 52 mL/min — ABNORMAL LOW (ref >60)
GLUCOSE: 106 mg/dL — AB (ref 65–99)
POTASSIUM: 4.2 mmol/L (ref 3.5–5.1)
Sodium: 134 mmol/L — ABNORMAL LOW (ref 135–145)

## 2015-02-25 LAB — TROPONIN I
TROPONIN I: 0.05 ng/mL — AB (ref <0.031)
Troponin I: 0.04 ng/mL — ABNORMAL HIGH (ref <0.031)

## 2015-02-25 LAB — PROTIME-INR
INR: 1.88 — AB (ref 0.00–1.49)
Prothrombin Time: 21.5 seconds — ABNORMAL HIGH (ref 11.6–15.2)

## 2015-02-25 LAB — CLOSTRIDIUM DIFFICILE BY PCR: Toxigenic C. Difficile by PCR: NEGATIVE

## 2015-02-25 MED ORDER — LOSARTAN POTASSIUM 50 MG PO TABS
50.0000 mg | ORAL_TABLET | Freq: Every day | ORAL | Status: DC
  Administered 2015-02-26 – 2015-02-27 (×2): 50 mg via ORAL
  Filled 2015-02-25 (×2): qty 1

## 2015-02-25 MED ORDER — WARFARIN SODIUM 3 MG PO TABS
3.0000 mg | ORAL_TABLET | Freq: Once | ORAL | Status: AC
Start: 2015-02-25 — End: 2015-02-25
  Administered 2015-02-25: 3 mg via ORAL
  Filled 2015-02-25: qty 1

## 2015-02-25 MED ORDER — LORAZEPAM 1 MG PO TABS
1.0000 mg | ORAL_TABLET | Freq: Once | ORAL | Status: AC
  Administered 2015-02-25: 1 mg via ORAL
  Filled 2015-02-25: qty 1

## 2015-02-25 NOTE — Progress Notes (Signed)
Paged Dr. Catha Gosselin regarding unsuccessful attempts to start new IV on pt and IV team unsuccessful attempts. Pt has new orders to be non telemetry and okay to have no IV access. Will continue to monitor pt.       Jilda Panda RN

## 2015-02-25 NOTE — Evaluation (Signed)
Physical Therapy Evaluation Patient Details Name: [name] MRN: [id number] DOB: 1935-09-21 Today's Date: 02/25/2015   History of Present Illness  80 year old male with history of coronary artery disease status post CABG, ischemic cardiomyopathy with systolic heart failure with an EF of 20%, atrial fibrillation on anticoagulation at present to the emergency department for shortness of breath and weakness. Patient also has a history of leaving AGAINST MEDICAL ADVICE and refusing skilled nursing facility. He recently had pacemaker placed. Patient admitted for generalized weakness as well as acute respiratory failure with heart failure.  Clinical Impression  Pt has been sleeping all afternoon. He was groggy but agreed to get up with me.  Pt unsteady.  Unsafe to go home.  Will need SNF skilled services and he agrees.    Follow Up Recommendations SNF    Equipment Recommendations  Rolling walker with 5" wheels    Recommendations for Other Services       Precautions / Restrictions Precautions Precautions: Fall Precaution Comments: Pt left day before and fell in bathroom same day Restrictions Weight Bearing Restrictions: No      Mobility  Bed Mobility Overal bed mobility: Needs Assistance       Supine to sit: Min assist     General bed mobility comments: pt couldnt get his weight on his arm after several attempts and I had pt help pt get into sitting  Transfers Overall transfer level: Needs assistance Equipment used: Rolling walker (2 wheeled)   Sit to Stand: Min assist         General transfer comment: Pt tried to urinate with urinal on side of bed and unable in sitting - i helped him to stand EOB and his balance too far anterior.  I helped him get his balance while urinating then he sat back down.  Ambulation/Gait Ambulation/Gait assistance: Min assist Ambulation Distance (Feet): 30 Feet Assistive device: Rolling walker (2 wheeled)       General Gait Details:  required cueing to stay within rolling walker during gait.  Pt with wide gait and flexed posture.  Stairs            Wheelchair Mobility    Modified Rankin (Stroke Patients Only)       Balance Overall balance assessment: Needs assistance             Standing balance comment: pt stood to  urinate with wide stance - wei gth too far anterior.  walker near for him to hold as needed - feet so wide they were outside of walker                             Pertinent Vitals/Pain Pain Assessment: No/denies pain    Home Living Family/patient expects to be discharged to:: Private residence Living Arrangements: Alone (wife is in SNF for hip fracture)   Type of Home: Mobile home         Home Equipment: Gilmer Mor - single point Additional Comments: Pt sleeping a lot during eval - he said he is tired from taking care of his wife. His story changes and he is not consistent with his informaltion    Prior Function Level of Independence: Needs assistance               Hand Dominance        Extremity/Trunk Assessment   Upper Extremity Assessment: Generalized weakness           Lower Extremity  Assessment: Generalized weakness      Cervical / Trunk Assessment: Kyphotic  Communication   Communication: No difficulties (but often hard to understand - sleepy doesnt help)  Cognition Arousal/Alertness: Lethargic (pt been sleeping much of the afternoon) Behavior During Therapy:  (impulsive with his movement) Overall Cognitive Status: No family/caregiver present to determine baseline cognitive functioning Area of Impairment: Safety/judgement;Awareness;Orientation;Memory;Following commands;Problem solving Orientation Level:  (oriented to place not time)     Following Commands: Follows one step commands inconsistently Safety/Judgement: Decreased awareness of safety;Decreased awareness of deficits   Problem Solving: Slow processing      General Comments       Exercises        Assessment/Plan    PT Assessment Patient needs continued PT services  PT Diagnosis Generalized weakness;Difficulty walking   PT Problem List Decreased strength;Decreased activity tolerance;Decreased balance;Decreased mobility;Decreased knowledge of use of DME;Decreased cognition;Decreased safety awareness  PT Treatment Interventions DME instruction;Gait training;Functional mobility training;Therapeutic activities;Patient/family education;Balance training   PT Goals (Current goals can be found in the Care Plan section) Acute Rehab PT Goals Patient Stated Goal: I dont care where i go but i want to be near my wife PT Goal Formulation: With patient Time For Goal Achievement: 03/11/15 Potential to Achieve Goals: Fair    Frequency Min 3X/week   Barriers to discharge Decreased caregiver support Pt agreeing to discharage SNF to try to get stronger to get back with wife. He said his life is over now and he doesnt much care w here he goes    Co-evaluation               End of Session Equipment Utilized During Treatment: Gait belt Activity Tolerance: Patient limited by fatigue;Treatment limited secondary to agitation Patient left: in bed;with call bell/phone within reach;with bed alarm set Nurse Communication: Mobility status         Time: 1540-1605 PT Time Calculation (min) (ACUTE ONLY): 25 min   Charges:   PT Evaluation $Initial PT Evaluation Tier I: 1 Procedure PT Treatments $Gait Training: 8-22 mins   PT G Codes:        Judson Roch 02/25/2015, 4:12 PM  02/25/2015  Ranae Palms, PT

## 2015-02-25 NOTE — Progress Notes (Signed)
ANTICOAGULATION CONSULT NOTE   Pharmacy Consult for Coumadin Indication: atrial fibrillation  No Known Allergies  Patient Measurements: Height: 5' 10.5" (179.1 cm) Weight: 200 lb 9.9 oz (91 kg) IBW/kg (Calculated) : 74.15 Heparin Dosing Weight:    Vital Signs: Temp: 97.6 F (36.4 C) (05/24 0621) Temp Source: Oral (05/24 0621) BP: 151/61 mmHg (05/24 0621) Pulse Rate: 60 (05/24 0621)  Labs:  Recent Labs  02/23/15 0425 02/24/15 1129 02/24/15 1826 02/25/15 0015 02/25/15 0551  HGB 10.8* 11.1*  --   --   --   HCT 33.4* 35.0*  --   --   --   PLT 192 188  --   --   --   LABPROT 31.3* 23.8*  --   --  21.5*  INR 3.09* 2.14*  --   --  1.88*  CREATININE 1.14 1.20  --   --  1.29*  TROPONINI  --   --  0.04* 0.05* 0.04*    Estimated Creatinine Clearance: 54.9 mL/min (by C-G formula based on Cr of 1.29).   Medical History: Past Medical History  Diagnosis Date  . Ischemic cardiomyopathy     a. prior CRTD implantation with subcutaneous array b. s/p device system extraction 2/2 infection  . VT (ventricular tachycardia)     a. s/p ablation  . Anxiety   . Depression   . Permanent atrial fibrillation   . Hyperlipidemia   . Gynecomastia   . BPH (benign prostatic hypertrophy)   . Diabetes mellitus   . Hypertension   . Degenerative joint disease   . GERD (gastroesophageal reflux disease)   . CAD (coronary artery disease)     a. s/p CABG    Medications:  Prescriptions prior to admission  Medication Sig Dispense Refill Last Dose  . ALPRAZolam (XANAX) 0.5 MG tablet Take 0.5 mg by mouth 3 (three) times daily as needed for anxiety.    Past Week at Unknown time  . amiodarone (PACERONE) 200 MG tablet Take 1/2 tablet (100 mg total) by mouth 5 days a week. (Patient taking differently: Take 100 mg by mouth daily. ) 15 tablet 6 Past Week at Unknown time  . finasteride (PROSCAR) 5 MG tablet Take 5 mg by mouth daily.    Past Week at Unknown time  . lactose free nutrition (BOOST) LIQD  Take 237 mLs by mouth 3 (three) times daily between meals.   Past Week at Unknown time  . levothyroxine (SYNTHROID, LEVOTHROID) 125 MCG tablet Take 125 mcg by mouth daily.   Past Week at Unknown time  . losartan (COZAAR) 50 MG tablet Take 0.5 tablets (25 mg total) by mouth daily. 30 tablet 6 Past Week at Unknown time  . saccharomyces boulardii (FLORASTOR) 250 MG capsule Take 1 capsule (250 mg total) by mouth 2 (two) times daily.   Past Week at Unknown time  . vancomycin (VANCOCIN) 50 mg/mL oral solution Take 2.5 mLs (125 mg total) by mouth 4 (four) times daily. Take for 12 days then stop. 120 mL 0 Past Week at Unknown time  . warfarin (COUMADIN) 1 MG tablet Take 1 tablet (1 mg total) by mouth daily. Take until seen in coumadin clinic. 30 tablet 0     Assessment: 80 y.o. male presents with weakness, SOB. Pt was just discharged home 5/22 after pacemaker placement on 5/20. During this hospitalization he also had diarrhea and was positive for cdiff. Originally treated with flagyl (5/20-5/21) but due to interaction between coumadin and flagyl, pt was switched to po  vancomycin.   Anticoagulation: Coumadin PTA (2.5mg  daily) for afib. INR was supratherapeutic during part of last admit so plan was to send home on  daily with recheck in 2-3 days. INR down 3.09>1.88 today. (Last dose  on 5/16, 5/18 had  IV Vitamin K, last dose of Flagyl 5/21).  Goal of Therapy:  INR 2-3 Monitor platelets by anticoagulation protocol: Yes   Plan:  Coumadin 3 mg po x 1 tonight. Daily INR  Thanks for allowing pharmacy to be a part of this patient's care.  Talbert Cage, PharmD Clinical Pharmacist, [phone]  02/25/2015,10:01 AM

## 2015-02-25 NOTE — Progress Notes (Signed)
    Subjective: No SOB.  Objective: Vital signs in last 24 hours: Temp:  [97.6 F (36.4 C)-99.1 F (37.3 C)] 97.6 F (36.4 C) (05/24 0621) Pulse Rate:  [59-108] 73 (05/24 1008) Resp:  [16-28] 20 (05/24 1008) BP: (117-164)/(57-104) 137/78 mmHg (05/24 1008) SpO2:  [87 %-100 %] 99 % (05/24 1008) Weight:  [200 lb 2.8 oz (90.8 kg)-200 lb 9.9 oz (91 kg)] 200 lb 9.9 oz (91 kg) (05/24 0218) Last BM Date: 02/24/15  Intake/Output from previous day: 05/23 0701 - 05/24 0700 In: 820 [P.O.:820] Out: 1550 [Urine:1550] Intake/Output this shift: Total I/O In: 460 [P.O.:460] Out: -   Medications Scheduled Meds: . amiodarone  100 mg Oral Daily  . atorvastatin  20 mg Oral q1800  . carvedilol  3.125 mg Oral BID WC  . finasteride  5 mg Oral Daily  . furosemide  40 mg Oral Daily  . lactose free nutrition  237 mL Oral TID BM  . levothyroxine  88 mcg Oral QAC breakfast  . losartan  25 mg Oral Daily  . saccharomyces boulardii  250 mg Oral BID  . sodium chloride  3 mL Intravenous Q12H  . vancomycin  125 mg Oral 4 times per day  . warfarin  3 mg Oral ONCE-1800  . Warfarin - Pharmacist Dosing Inpatient   Does not apply q1800   Continuous Infusions:  PRN Meds:.sodium chloride, acetaminophen, ALPRAZolam, alum & mag hydroxide-simeth, ondansetron (ZOFRAN) IV, sodium chloride  PE: General appearance: alert, cooperative and no distress Neck: no JVD Lungs: DEcreased BS at bases.  no rales. Heart: regular rate and rhythm, S1, S2 normal, no murmur, click, rub or gallop Abdomen: +BS, No distention, nontender Extremities: 1+ left > R LEE Pulses: 2+ and symmetric Skin: Warm and dry Neurologic: Grossly normal  Lab Results:   Recent Labs  02/23/15 0425 02/24/15 1129  WBC 7.4 8.3  HGB 10.8* 11.1*  HCT 33.4* 35.0*  PLT 192 188   BMET  Recent Labs  02/23/15 0425 02/24/15 1129 02/25/15 0551  NA 137 136 134*  K 4.7 4.3 4.2  CL 103 102 102  CO2 28 26 24   GLUCOSE 104* 113* 106*  BUN 25*  22* 22*  CREATININE 1.14 1.20 1.29*  CALCIUM 8.4* 8.5* 8.8*   PT/INR  Recent Labs  02/23/15 0425 02/24/15 1129 02/25/15 0551  LABPROT 31.3* 23.8* 21.5*  INR 3.09* 2.14* 1.88*    Assessment/Plan  Active Problems:   Ischemic cardiomyopathy, EF 20%   SYSTOLIC HEART FAILURE, ACUTE ON CHRONIC  Net fluids: -0.7L.  Now on daily PO lasix.  ARB.  Continue PO lasix.     Atrial fibrillation  On coumadin.  INR subtherapeutic. 1.88.  Pharmacy following. BB   C. difficile colitis  Per [facility]   SP St Jude single chamber PPM on 5/20.  The device is functioning properly.  Pacer site looks good.     Generalized weakness  From C. Diff?  Recommend PT/OT evaluation   LOS: 1 day    HAGER, BRYAN PA-C 02/25/2015 10:30 AM As above, patient denies chest pain or dyspnea. Volume status improving. Plan to continue Cozaar (increase to 50 mg daily), carvedilol and present dose of Lasix. Follow renal function. As outlined previously he appears to be in underlying atrial fibrillation. Could proceed with cardioversion in the future if congestive heart failure not controlled with medications. Continue Coumadin. Further evaluation of generalized weakness per primary care. Olga Millers

## 2015-02-25 NOTE — Progress Notes (Signed)
LCSW following for disposition. Patient at this time is refusing SNF. LCSW met with patient in ED on 5/23 and patient was not interested, reported he wanted to go home. He was agreeable to HH and [facility] following in the community. Patient to have PT/OT consult and LCSW will continue to follow acutely if patient agrees with SNF.  Hannah Coble LCSW, MSW Clinical Social Work: Emergency Room [phone] 

## 2015-02-25 NOTE — Progress Notes (Signed)
[facility]                                                                              Patient Demographics  [name], is a 80 y.o. male, DOB - August 12, 1935, OZH:[id number]  Admit date - 02/24/2015   Admitting Physician Renae Fickle, MD  Outpatient Primary MD for the patient is Ezequiel Kayser, MD  LOS - 1   Chief Complaint  Patient presents with  . Weakness      Interim history 80 year old male with history of coronary artery disease status post CABG, ischemic cardiomyopathy with systolic heart failure with an EF of 20%, atrial fibrillation on anticoagulation at present to the emergency department for shortness of breath and weakness. Patient also has a history of leaving AGAINST MEDICAL ADVICE and refusing skilled nursing facility. He recently had pacemaker placed. Patient admitted for generalized weakness as well as acute respiratory failure with heart failure. Cardiology consulted.  Assessment & Plan   Generalized weakness -multifactorial: mild acute on chronic systolic heart failure and C. Diff colitis -Diuresis as below -Ongoing physical and occupational therapy -Social work consulted for placement -Spoke with patient, he is now open to SNF, would like to be with his wife.  Acute respiratory failure/ chronic systolic and diastolic CHF -EF 62% -Monitor daily weights, intake and output -Cardiology consulted and appreciated -Continue diuresis, Lasix by mouth -Continue cozaar (increased by cardiology to  daily) -Continue Coreg, statin  Hypothyroidism -TSH 0.047, fT4 3.33, fT3 2.5 on 02/16/2015 c/w too much synthroid -Decrease synthroid to 88 mcg -Needs repeat TSH in about 2-3 weeks  Paroxysmal atrial fibrillation/complete heart block/PPM/ICD placement -CHADs2vasc 5  -Continue amiodarone -Warfarin per pharmacy  CAD -Currently patient is chest pain free -Not on ASA as on warfarin due to bleeding risk -Continue statin, coreg, cozaar   C. Diff  diarrhea -Continue oral vancomycin  BPH  -continue finasteride  Anxiety -continue prn xanax  Severe protein calorie malnutrition -Continue supplements -Nutrition consultation   Code Status: DNR  Family Communication: None at bedside  Disposition Plan: Admitted. Pending PT eval, and will likely need SNF  Time Spent in minutes   30 minutes  Procedures  None  Consults   Cardilogy  DVT Prophylaxis  Coumadin  Lab Results  Component Value Date   PLT 188 02/24/2015    Medications  Scheduled Meds: . amiodarone  100 mg Oral Daily  . atorvastatin  20 mg Oral q1800  . carvedilol  3.125 mg Oral BID WC  . finasteride  5 mg Oral Daily  . furosemide  40 mg Oral Daily  . lactose free nutrition  237 mL Oral TID BM  . levothyroxine  88 mcg Oral QAC breakfast  . [START ON 02/26/2015] losartan  50 mg Oral Daily  . saccharomyces boulardii  250 mg Oral BID  . sodium chloride  3 mL Intravenous Q12H  . vancomycin  125 mg Oral 4 times per day  . warfarin  3 mg Oral ONCE-1800  . Warfarin - Pharmacist Dosing Inpatient   Does not apply q1800   Continuous Infusions:  PRN Meds:.sodium chloride, acetaminophen, ALPRAZolam, alum & mag hydroxide-simeth, ondansetron (ZOFRAN) IV, sodium chloride  Antibiotics  Anti-infectives    Start     Dose/Rate Route Frequency Ordered Stop   02/24/15 1400  vancomycin (VANCOCIN) 50 mg/mL oral solution 125 mg     125 mg Oral 4 times per day 02/24/15 1325 03/08/15 1159        Subjective:   [name] seen and examined today.  Patient denies shortness of breath, still feels weak. Complains of poor appetite. Patient denies chest pain, abdominal pain. Does state that he will now go to a nursing home, and like to be with his wife.  Objective:   Filed Vitals:   02/25/15 0218 02/25/15 0621 02/25/15 1008 02/25/15 1337  BP: 145/66 151/61 137/78 120/53  Pulse: 62 60 73 63  Temp: 98 F (36.7 C) 97.6 F (36.4 C)  97.2 F (36.2 C)  TempSrc: Oral  Oral Oral Oral  Resp: 20 20 20 18   Height:      Weight: 91 kg (200 lb 9.9 oz)     SpO2: 97% 96% 99% 97%    Wt Readings from Last 3 Encounters:  02/25/15 91 kg (200 lb 9.9 oz)  02/23/15 92.2 kg (203 lb 4.2 oz)  02/19/15 92.534 kg (204 lb)     Intake/Output Summary (Last 24 hours) at 02/25/15 1342 Last data filed at 02/25/15 1007  Gross per 24 hour  Intake   1280 ml  Output   1550 ml  Net   -270 ml    Exam  General: Well developed, no distress  HEENT: NCAT,  mucous membranes moist.   Cardiovascular: S1 S2 auscultated, no rubs, murmurs or gallops. Regular rate and rhythm.  Respiratory: Diminished, no wheezing rhonchi or rales  Abdomen: Soft, nontender, nondistended, + bowel sounds  Extremities: warm dry without cyanosis clubbing. +1 edema, LLE > RLE (chronic per patient)  Neuro: AAOx3, nonfocal  Psych: Appropriate affect and demeanor    Data Review   Micro Results Recent Results (from the past 240 hour(s))  Culture, Urine     Status: None   Collection Time: 02/16/15  5:00 PM  Result Value Ref Range Status   Specimen Description URINE, CLEAN CATCH  Final   Special Requests NONE  Final   Colony Count   Final    6,000 COLONIES/ML Performed at [facility]    Culture   Final    INSIGNIFICANT GROWTH Performed at [facility]    Report Status 02/17/2015 FINAL  Final  Clostridium Difficile by PCR     Status: Abnormal   Collection Time: 02/21/15 10:25 AM  Result Value Ref Range Status   C difficile by pcr POSITIVE (A) NEGATIVE Final    Comment: CRITICAL RESULT CALLED TO, READ BACK BY AND VERIFIED WITH: T. FAIRING RN 11:40 02/21/15 ([email or web address])   Blood culture (routine x 2)     Status: None (Preliminary result)   Collection Time: 02/24/15 11:29 AM  Result Value Ref Range Status   Specimen Description BLOOD ARM LEFT  Final   Special Requests BOTTLES DRAWN AEROBIC AND ANAEROBIC 5CC  Final   Culture   Final           BLOOD CULTURE RECEIVED NO  GROWTH TO DATE CULTURE WILL BE HELD FOR 5 DAYS BEFORE ISSUING A FINAL NEGATIVE REPORT Performed at [facility]    Report Status PENDING  Incomplete  Blood culture (routine x 2)     Status: None (Preliminary result)   Collection Time: 02/24/15 11:31 AM  Result Value Ref Range Status   Specimen  Description BLOOD ARM RIGHT  Final   Special Requests BOTTLES DRAWN AEROBIC ONLY 5CC  Final   Culture   Final           BLOOD CULTURE RECEIVED NO GROWTH TO DATE CULTURE WILL BE HELD FOR 5 DAYS BEFORE ISSUING A FINAL NEGATIVE REPORT Performed at [facility]    Report Status PENDING  Incomplete    Radiology Reports Ct Abdomen Pelvis Wo Contrast  02/20/2015   CLINICAL DATA:  Persistent diarrhea.  Bradycardia.  EXAM: CT ABDOMEN AND PELVIS WITHOUT CONTRAST  TECHNIQUE: Multidetector CT imaging of the abdomen and pelvis was performed following the standard protocol without IV contrast.  COMPARISON:  08/09/2011 report, images not available  FINDINGS: Coronary artery calcifications with post CABG changes. Bilateral pleural effusions with compressive atelectasis. Pleural effusions are small to moderate in size. Negative for free air.  Unenhanced CT was performed per clinician order. Lack of IV contrast limits sensitivity and specificity, especially for evaluation of abdominal/pelvic solid viscera.  There is a small amount of perihepatic and perisplenic ascites. The gallbladder is mildly distended with high-density material suggestive for sludge and cannot exclude gallstones. No gross abnormality to the spleen or pancreas. Normal appearance of the adrenal glands and kidneys. Negative for kidney stones or hydronephrosis. There is a small amount of fluid or edema around the duodenum. No significant lymphadenopathy. There is edema or stranding in the lower abdomen around the iliac vessels, particularly on the left side. This appears to be extraperitoneal fluid. There is diffuse subcutaneous edema. There is  edema in the presacral region. No gross abnormality to the prostate or urinary bladder.  No gross abnormality to the small or large bowel. A normal appearing appendix.  Disc space narrowing and disease at L4-L5. No acute bone abnormality.  IMPRESSION: Bilateral pleural effusions with a small amount of intra-abdominal fluid and extensive subcutaneous edema. Findings are suggestive for anasarca.  The gallbladder is distended with high-density material that could represent sludge. Gallbladder could be further characterized with ultrasound.   Electronically Signed   By: Richarda Overlie M.D.   On: 02/20/2015 15:32   Ct Head Wo Contrast  02/20/2015   CLINICAL DATA:  LOC, diarrhea  EXAM: CT HEAD WITHOUT CONTRAST  TECHNIQUE: Contiguous axial images were obtained from the base of the skull through the vertex without intravenous contrast.  COMPARISON:  12/04/2009  FINDINGS: No skull fracture is noted. Paranasal sinuses and mastoid air cells are unremarkable. Atherosclerotic calcifications of carotid siphon again noted. Stable atrophy and chronic white matter disease. No acute cortical infarction. No mass lesion is noted on this unenhanced scan. Ventricular size is stable from prior exam.  IMPRESSION: No acute intracranial abnormality. Stable atrophy and chronic white matter disease. No acute cortical infarction.   Electronically Signed   By: Natasha Mead M.D.   On: 02/20/2015 15:32   Dg Chest Port 1 View  02/24/2015   CLINICAL DATA:  Weakness, left lower quadrant pain  EXAM: PORTABLE CHEST - 1 VIEW  COMPARISON:  02/22/2015  FINDINGS: Cardiomegaly again noted. Status post median sternotomy. No acute infiltrate or pleural effusion. No pulmonary edema.  IMPRESSION: No active disease.   Electronically Signed   By: Natasha Mead M.D.   On: 02/24/2015 12:16   Dg Chest Port 1 View  02/22/2015   CLINICAL DATA:  80 year old male status post placement of cardiac device  EXAM: PORTABLE CHEST - 1 VIEW  COMPARISON:  Preoperative chest  x-ray 02/20/2015  FINDINGS: Interval placement of  a right subclavian approach single lead cardiac rhythm maintenance device. The lead projects over the right ventricle. Cardiomegaly with left heart enlargement. Patient is status post median sternotomy with evidence of prior multivessel CABG including LIMA bypass. Slightly increased pulmonary vascular congestion without overt edema. No evidence of pneumothorax or large pleural effusion. Inspiratory volumes are slightly low with mild bibasilar atelectasis. No acute osseous abnormality.  IMPRESSION: 1. New right subclavian approach single lead cardiac rhythm maintenance device. The lead projects over the right ventricle. 2. No evidence of pneumothorax. 3. Slightly increased pulmonary vascular congestion without overt edema. 4. Lower inspiratory volumes with bibasilar atelectasis.   Electronically Signed   By: Malachy Moan M.D.   On: 02/22/2015 12:13   Dg Chest Portable 1 View  02/20/2015   CLINICAL DATA:  Persistent diarrhea, bradycardia, history diabetes mellitus, hypertension, paroxysmal atrial fibrillation, coronary artery disease post MI and and CABG, ischemic cardiomyopathy  EXAM: PORTABLE CHEST - 1 VIEW  COMPARISON:  Portable exam 1329 hours compared to 02/16/2015  FINDINGS: Enlargement of cardiac silhouette post CABG.  Slight pulmonary vascular congestion.  Bibasilar atelectasis.  Lungs otherwise clear.  No pleural effusion or pneumothorax.  IMPRESSION: Enlargement of cardiac silhouette with pulmonary vascular congestion post CABG.  Mild bibasilar atelectasis.   Electronically Signed   By: Ulyses Southward M.D.   On: 02/20/2015 14:00   Dg Chest Port 1 View  02/16/2015   CLINICAL DATA:  Shortness of breath, CHF.  EXAM: PORTABLE CHEST - 1 VIEW  COMPARISON:  02/09/2015  FINDINGS: Cardiomegaly and CABG changes again noted.  Pulmonary vascular congestion with possible mild interstitial edema again noted.  There is no evidence of pneumothorax.  Mild bilateral  lower lung atelectasis/airspace disease again noted.  IMPRESSION: Little significant change. Bilateral lower lung atelectasis/ airspace disease. Pneumonia not excluded.  Cardiomegaly with pulmonary vascular congestion and possible mild interstitial edema.   Electronically Signed   By: Harmon Pier M.D.   On: 02/16/2015 16:28   Dg Abd Portable 1v  02/24/2015   CLINICAL DATA:  Lower abdominal pain and weakness  EXAM: PORTABLE ABDOMEN - 1 VIEW  COMPARISON:  None.  FINDINGS: Bowel gas pattern unremarkable. No obstruction or free air is seen on this supine examination. There is contrast in the rectum. There are vascular calcifications in the pelvis.  IMPRESSION: Bowel gas pattern unremarkable.   Electronically Signed   By: Bretta Bang III M.D.   On: 02/24/2015 12:18    CBC  Recent Labs Lab 02/19/15 0346 02/20/15 1245 02/22/15 0900 02/23/15 0425 02/24/15 1129  WBC 6.3 9.0 9.2 7.4 8.3  HGB 11.0* 11.7* 10.8* 10.8* 11.1*  HCT 35.4* 37.8* 35.1* 33.4* 35.0*  PLT 287 341 240 192 188  MCV 91.9 93.8 90.7 91.8 89.5  MCH 28.6 29.0 27.9 29.7 28.4  MCHC 31.1 31.0 30.8 32.3 31.7  RDW 15.2 15.2 15.2 15.3 15.3  LYMPHSABS  --  1.0  --   --  0.4*  MONOABS  --  1.3*  --   --  0.7  EOSABS  --  0.1  --   --  0.0  BASOSABS  --  0.0  --   --  0.0    Chemistries   Recent Labs Lab 02/20/15 1245 02/21/15 1208 02/22/15 0900 02/23/15 0425 02/24/15 1129 02/25/15 0551  NA 138  --  135 137 136 134*  K 4.5  --  4.4 4.7 4.3 4.2  CL 103  --  101 103 102 102  CO2 26  --  24 28 26 24   GLUCOSE 161*  --  151* 104* 113* 106*  BUN 45*  --  31* 25* 22* 22*  CREATININE 1.53*  --  1.32* 1.14 1.20 1.29*  CALCIUM 8.8*  --  8.3* 8.4* 8.5* 8.8*  MG  --  2.4  --   --   --   --   AST 31  --  20  --  25  --   ALT 66*  --  39  --  23  --   ALKPHOS 97  --  76  --  69  --   BILITOT 1.7*  --  1.8*  --  2.1*  --     ------------------------------------------------------------------------------------------------------------------ estimated creatinine clearance is 54.9 mL/min (by C-G formula based on Cr of 1.29). ------------------------------------------------------------------------------------------------------------------ No results for input(s): HGBA1C in the last 72 hours. ------------------------------------------------------------------------------------------------------------------ No results for input(s): CHOL, HDL, LDLCALC, TRIG, CHOLHDL, LDLDIRECT in the last 72 hours. ------------------------------------------------------------------------------------------------------------------ No results for input(s): TSH, T4TOTAL, T3FREE, THYROIDAB in the last 72 hours.  Invalid input(s): FREET3 ------------------------------------------------------------------------------------------------------------------ No results for input(s): VITAMINB12, FOLATE, FERRITIN, TIBC, IRON, RETICCTPCT in the last 72 hours.  Coagulation profile  Recent Labs Lab 02/20/15 1245 02/22/15 0900 02/23/15 0425 02/24/15 1129 02/25/15 0551  INR 2.35* 3.21* 3.09* 2.14* 1.88*    No results for input(s): DDIMER in the last 72 hours.  Cardiac Enzymes  Recent Labs Lab 02/24/15 1826 02/25/15 0015 02/25/15 0551  TROPONINI 0.04* 0.05* 0.04*   ------------------------------------------------------------------------------------------------------------------ Invalid input(s): POCBNP    Avri Paiva D.O. on 02/25/2015 at 1:42 PM  Between 7am to 7pm - Pager - [phone]  After 7pm go to [email or web address] - password [email or web address]  And look for the night coverage person covering for me after hours  [facility] Office  [phone]

## 2015-02-26 DIAGNOSIS — I255 Ischemic cardiomyopathy: Secondary | ICD-10-CM

## 2015-02-26 DIAGNOSIS — R531 Weakness: Secondary | ICD-10-CM

## 2015-02-26 DIAGNOSIS — I480 Paroxysmal atrial fibrillation: Secondary | ICD-10-CM

## 2015-02-26 DIAGNOSIS — I5043 Acute on chronic combined systolic (congestive) and diastolic (congestive) heart failure: Principal | ICD-10-CM

## 2015-02-26 DIAGNOSIS — A047 Enterocolitis due to Clostridium difficile: Secondary | ICD-10-CM

## 2015-02-26 LAB — BASIC METABOLIC PANEL
Anion gap: 9 (ref 5–15)
BUN: 22 mg/dL — AB (ref 6–20)
CO2: 23 mmol/L (ref 22–32)
Calcium: 8.3 mg/dL — ABNORMAL LOW (ref 8.9–10.3)
Chloride: 102 mmol/L (ref 101–111)
Creatinine, Ser: 1.27 mg/dL — ABNORMAL HIGH (ref 0.61–1.24)
GFR calc Af Amer: >60 mL/min (ref >60)
GFR calc non Af Amer: 53 mL/min — ABNORMAL LOW (ref >60)
Glucose, Bld: 95 mg/dL (ref 65–99)
POTASSIUM: 4.3 mmol/L (ref 3.5–5.1)
Sodium: 134 mmol/L — ABNORMAL LOW (ref 135–145)

## 2015-02-26 LAB — PROTIME-INR
INR: 2.33 — ABNORMAL HIGH (ref 0.00–1.49)
Prothrombin Time: 25.3 seconds — ABNORMAL HIGH (ref 11.6–15.2)

## 2015-02-26 MED ORDER — WARFARIN SODIUM 2.5 MG PO TABS
2.5000 mg | ORAL_TABLET | Freq: Once | ORAL | Status: AC
  Administered 2015-02-26: 2.5 mg via ORAL
  Filled 2015-02-26: qty 1

## 2015-02-26 NOTE — Evaluation (Signed)
Occupational Therapy Evaluation Patient Details Name: [name] MRN: [id number] DOB: September 27, 1935 Today's Date: 02/26/2015    History of Present Illness 80 year old male with history of coronary artery disease status post CABG, ischemic cardiomyopathy with systolic heart failure with an EF of 20%, atrial fibrillation on anticoagulation at present to the emergency department for shortness of breath and weakness. Patient also has a history of leaving AGAINST MEDICAL ADVICE and refusing skilled nursing facility. He recently had pacemaker placed. Patient admitted for generalized weakness as well as acute respiratory failure with heart failure.   Clinical Impression   Pt admitted with fall. Pt currently with functional limitations due to the deficits listed below (see OT Problem List).  Pt will benefit from skilled OT to increase their safety and independence with ADL and functional mobility for ADL to facilitate discharge to venue listed below.      Follow Up Recommendations  Other (comment);SNF (recommend SNF but pt states he is going home)    Equipment Recommendations  Other (comment);None recommended by OT (RW )    Recommendations for Other Services       Precautions / Restrictions Precautions Precautions: Fall Precaution Comments: Pt left day before and fell in bathroom same day Restrictions Weight Bearing Restrictions: No      Mobility Bed Mobility Overal bed mobility: Needs Assistance Bed Mobility: Supine to Sit;Sit to Supine     Supine to sit: Min assist Sit to supine: Min assist      Transfers Overall transfer level: Needs assistance Equipment used: 1 person hand held assist Transfers: Sit to/from Stand Sit to Stand: Min assist                   ADL Overall ADL's : Needs assistance/impaired                                     Functional mobility during ADLs: Moderate assistance General ADL Comments: pt needs A with ADL activity and  is agreeable to SNF.  Pt incnosistent with answers to questions and says he doesnt care where he goes.  pt still worried about birds. encouraged him to call a friend. pt states he doesnt have one.                Pertinent Vitals/Pain Pain Assessment: No/denies pain     Hand Dominance     Extremity/Trunk Assessment         Cervical / Trunk Assessment Cervical / Trunk Assessment: Kyphotic   Communication Communication Communication: No difficulties (but often hard to understand - sleepy doesnt help)   Cognition Arousal/Alertness: Awake/alert (pt been sleeping much of the afternoon) Behavior During Therapy: WFL for tasks assessed/performed (impulsive with his movement) Overall Cognitive Status: No family/caregiver present to determine baseline cognitive functioning Area of Impairment: Safety/judgement;Awareness;Orientation;Memory;Following commands;Problem solving Orientation Level:  (oriented to place not time)     Following Commands: Follows one step commands consistently Safety/Judgement: Decreased awareness of safety;Decreased awareness of deficits   Problem Solving: Slow processing     General Comments               Home Living Family/patient expects to be discharged to:: Private residence Living Arrangements: Alone (wife is in SNF for hip fracture)   Type of Home: Mobile home       Home Layout: One level  Home Equipment: Cane - single point   Additional Comments: His story changes and he is not consistent with his informaltion      Prior Functioning/Environment Level of Independence: Needs assistance             OT Diagnosis: Generalized weakness;Acute pain;Cognitive deficits   OT Problem List: Decreased strength;Decreased range of motion;Decreased activity tolerance;Impaired balance (sitting and/or standing);Decreased safety awareness;Decreased knowledge of use of DME or AE;Decreased knowledge of precautions   OT  Treatment/Interventions: Self-care/ADL training;Therapeutic exercise;Energy conservation;DME and/or AE instruction;Therapeutic activities;Patient/family education;Balance training    OT Goals(Current goals can be found in the care plan section) Acute Rehab OT Goals Patient Stated Goal: I dont care where i go but i want to be near my wife OT Goal Formulation: With patient Time For Goal Achievement: 03/12/15 ADL Goals Pt Will Perform Grooming: with set-up;standing Pt Will Perform Lower Body Dressing: with min assist;sit to/from stand Pt Will Transfer to Toilet: with supervision;ambulating;bedside commode Pt Will Perform Toileting - Clothing Manipulation and hygiene: with supervision;sit to/from stand  OT Frequency: Min 2X/week   Barriers to D/C: Decreased caregiver support          Co-evaluation              End of Session Equipment Utilized During Treatment: Gait belt;Rolling walker Nurse Communication: Mobility status  Activity Tolerance: Patient tolerated treatment well Patient left: in chair;with call bell/phone within reach   Time: 0941-1004 OT Time Calculation (min): 23 min Charges:  OT General Charges $OT Visit: 1 Procedure OT Evaluation $Initial OT Evaluation Tier I: 1 Procedure G-Codes:    Alba Cory 05-Mar-2015, 10:15 AM

## 2015-02-26 NOTE — Clinical Social Work Placement (Signed)
   CLINICAL SOCIAL WORK PLACEMENT  NOTE  Date:  02/26/2015  Patient Details  Name: [name] MRN: [id number] Date of Birth: January 22, 1935  Clinical Social Work is seeking post-discharge placement for this patient at the Skilled  Nursing Facility level of care (*CSW will initial, date and re-position this form in  chart as items are completed):  Yes   Patient/family provided with [facility] Clinical Social Work Department's list of facilities offering this level of care within the geographic area requested by the patient (or if unable, by the patient's family).  Yes   Patient/family informed of their freedom to choose among providers that offer the needed level of care, that participate in Medicare, Medicaid or managed care program needed by the patient, have an available bed and are willing to accept the patient.  Yes   Patient/family informed of [facility]'s ownership interest in [facility] and [facility], as well as of the fact that they are under no obligation to receive care at these facilities.  PASRR submitted to EDS on 02/26/15     PASRR number received on 02/26/15     Existing PASRR number confirmed on 02/26/15     FL2 transmitted to all facilities in geographic area requested by pt/family on 02/26/15     FL2 transmitted to all facilities within larger geographic area on       Patient informed that his/her managed care company has contracts with or will negotiate with certain facilities, including the following:            Patient/family informed of bed offers received.  Patient chooses bed at       Physician recommends and patient chooses bed at      Patient to be transferred to   on  .  Patient to be transferred to facility by       Patient family notified on   of transfer.  Name of family member notified:        PHYSICIAN Please sign FL2     Additional Comment:  Clinicals have been faxed to [facility]. Unit CSW to follow up with progress of  placement and assist with other needs. _______________________________________________ Raye Sorrow, LCSW 02/26/2015, 8:31 AM

## 2015-02-26 NOTE — Progress Notes (Signed)
[facility]                                                                              P Multiple somatic complaints today versus says he is cold and then says he is hungry and says he doesn't feel well Nursing tells me patient is "high maintenance" He has no acute objective issue states that he has some burning in the stomach but no diarrhea since 5/24  Chief Complaint  Patient presents with  . Weakness      Interim history 80 year old male with history of coronary artery disease status post CABG, ischemic cardiomyopathy with systolic heart failure with an EF of 20%, atrial fibrillation on anticoagulation at present to the emergency department for shortness of breath and weakness. Patient also has a history of leaving AGAINST MEDICAL ADVICE and refusing skilled nursing facility. He recently had pacemaker placed. Patient admitted for generalized weakness as well as acute respiratory failure with heart failure. Cardiology consulted.  Assessment & Plan   Generalized weakness -multifactorial: mild acute on chronic systolic heart failure and C. Diff colitis -Diuresis as below -Ongoing physical and occupational therapy -Social work consulted for placement -Likely discharged either to home or skilled facility 5/26  Acute respiratory failure/ chronic systolic and diastolic CHF -EF 16% -Monitor daily weights, intake and output -Cardiology consulted and appreciated -Continue diuresis, Lasix by mouth -Continue cozaar (increased by cardiology to 50mg  daily) -Continue Coreg, statin  Hypothyroidism -TSH 0.047, fT4 3.33, fT3 2.5 on 02/16/2015 c/w too much synthroid -Decrease synthroid to 88 mcg -Needs repeat TSH in about 2-3 weeks  Paroxysmal atrial fibrillation/complete heart block/PPM/ICD placement -CHADs2vasc 5  -Continue amiodarone -Outpatient LFTs to be done about the same time his TSH as on amiodarone -Warfarin per pharmacy  CAD -Currently patient is chest pain  free -Not on ASA as on warfarin due to bleeding risk -Continue statin, coreg, cozaar   C. Diff diarrhea -Continue oral vancomycin, needs to stop taking on 5/30 which would be 10 days of empiric treatment  BPH  -continue finasteride  Anxiety -continue prn xanax  Severe protein calorie malnutrition -Continue supplements -Nutrition consultation   Code Status: DNR  Family Communication: None at bedside  Disposition Plan: Likely discharge in a.m.  Time Spent in minutes   30 minutes  Procedures  None  Consults   Cardilogy  DVT Prophylaxis  Coumadin  Lab Results  Component Value Date   PLT 188 02/24/2015    Medications  Scheduled Meds: . amiodarone  100 mg Oral Daily  . atorvastatin  20 mg Oral q1800  . carvedilol  3.125 mg Oral BID WC  . finasteride  5 mg Oral Daily  . furosemide  40 mg Oral Daily  . lactose free nutrition  237 mL Oral TID BM  . levothyroxine  88 mcg Oral QAC breakfast  . losartan  50 mg Oral Daily  . saccharomyces boulardii  250 mg Oral BID  . sodium chloride  3 mL Intravenous Q12H  . vancomycin  125 mg Oral 4 times per day  . warfarin  2.5 mg Oral ONCE-1800  . Warfarin - Pharmacist Dosing Inpatient   Does not apply q1800   Continuous Infusions:  PRN Meds:.sodium chloride, acetaminophen, ALPRAZolam, alum & mag hydroxide-simeth, ondansetron (ZOFRAN) IV, sodium chloride  Antibiotics    Anti-infectives    Start     Dose/Rate Route Frequency Ordered Stop   02/24/15 1400  vancomycin (VANCOCIN) 50 mg/mL oral solution 125 mg     125 mg Oral 4 times per day 02/24/15 1325 03/08/15 1159        Subjective:   Doing fair. Multiple somatic complaints as above  Objective:   Filed Vitals:   02/26/15 0854 02/26/15 1115 02/26/15 1403 02/26/15 1635  BP: 136/70 126/66 112/57 143/69  Pulse: 64 66 64 66  Temp:   97.9 F (36.6 C)   TempSrc:   Oral   Resp:  16 18   Height:      Weight:      SpO2:  98% 95%     Wt Readings from Last 3  Encounters:  02/26/15 89.9 kg (198 lb 3.1 oz)  02/23/15 92.2 kg (203 lb 4.2 oz)  02/19/15 92.534 kg (204 lb)     Intake/Output Summary (Last 24 hours) at 02/26/15 1715 Last data filed at 02/26/15 1636  Gross per 24 hour  Intake    715 ml  Output   1175 ml  Net   -460 ml    Exam  General: Well developed, no distress, afebrile, moderate dentition  HEENT: NCAT,  mucous membranes moist.   Cardiovascular: S1 S2 auscultated, no rubs, murmurs or gallops. Regular rate and rhythm.  Respiratory: Clear no added sound  Abdomen: Soft, nontender, nondistended, + bowel sounds  Extremities: No lower extremity edema  Neuro: AAOx3, nonfocal  Psych: Appropriate affect and demeanor    Data Review   Micro Results Recent Results (from the past 240 hour(s))  Clostridium Difficile by PCR     Status: Abnormal   Collection Time: 02/21/15 10:25 AM  Result Value Ref Range Status   C difficile by pcr POSITIVE (A) NEGATIVE Final    Comment: CRITICAL RESULT CALLED TO, READ BACK BY AND VERIFIED WITH: T. FAIRING RN 11:40 02/21/15 ([email or web address])   Blood culture (routine x 2)     Status: None (Preliminary result)   Collection Time: 02/24/15 11:29 AM  Result Value Ref Range Status   Specimen Description BLOOD ARM LEFT  Final   Special Requests BOTTLES DRAWN AEROBIC AND ANAEROBIC 5CC  Final   Culture   Final           BLOOD CULTURE RECEIVED NO GROWTH TO DATE CULTURE WILL BE HELD FOR 5 DAYS BEFORE ISSUING A FINAL NEGATIVE REPORT Performed at [facility]    Report Status PENDING  Incomplete  Blood culture (routine x 2)     Status: None (Preliminary result)   Collection Time: 02/24/15 11:31 AM  Result Value Ref Range Status   Specimen Description BLOOD ARM RIGHT  Final   Special Requests BOTTLES DRAWN AEROBIC ONLY 5CC  Final   Culture   Final           BLOOD CULTURE RECEIVED NO GROWTH TO DATE CULTURE WILL BE HELD FOR 5 DAYS BEFORE ISSUING A FINAL NEGATIVE REPORT Performed at [facility]    Report Status PENDING  Incomplete  Clostridium Difficile by PCR     Status: None   Collection Time: 02/25/15 12:22 PM  Result Value Ref Range Status   C difficile by pcr NEGATIVE NEGATIVE Final    Radiology Reports Ct Abdomen Pelvis Wo Contrast  02/20/2015   CLINICAL DATA:  Persistent  diarrhea.  Bradycardia.  EXAM: CT ABDOMEN AND PELVIS WITHOUT CONTRAST  TECHNIQUE: Multidetector CT imaging of the abdomen and pelvis was performed following the standard protocol without IV contrast.  COMPARISON:  08/09/2011 report, images not available  FINDINGS: Coronary artery calcifications with post CABG changes. Bilateral pleural effusions with compressive atelectasis. Pleural effusions are small to moderate in size. Negative for free air.  Unenhanced CT was performed per clinician order. Lack of IV contrast limits sensitivity and specificity, especially for evaluation of abdominal/pelvic solid viscera.  There is a small amount of perihepatic and perisplenic ascites. The gallbladder is mildly distended with high-density material suggestive for sludge and cannot exclude gallstones. No gross abnormality to the spleen or pancreas. Normal appearance of the adrenal glands and kidneys. Negative for kidney stones or hydronephrosis. There is a small amount of fluid or edema around the duodenum. No significant lymphadenopathy. There is edema or stranding in the lower abdomen around the iliac vessels, particularly on the left side. This appears to be extraperitoneal fluid. There is diffuse subcutaneous edema. There is edema in the presacral region. No gross abnormality to the prostate or urinary bladder.  No gross abnormality to the small or large bowel. A normal appearing appendix.  Disc space narrowing and disease at L4-L5. No acute bone abnormality.  IMPRESSION: Bilateral pleural effusions with a small amount of intra-abdominal fluid and extensive subcutaneous edema. Findings are suggestive for anasarca.  The  gallbladder is distended with high-density material that could represent sludge. Gallbladder could be further characterized with ultrasound.   Electronically Signed   By: Richarda Overlie M.D.   On: 02/20/2015 15:32   Ct Head Wo Contrast  02/20/2015   CLINICAL DATA:  LOC, diarrhea  EXAM: CT HEAD WITHOUT CONTRAST  TECHNIQUE: Contiguous axial images were obtained from the base of the skull through the vertex without intravenous contrast.  COMPARISON:  12/04/2009  FINDINGS: No skull fracture is noted. Paranasal sinuses and mastoid air cells are unremarkable. Atherosclerotic calcifications of carotid siphon again noted. Stable atrophy and chronic white matter disease. No acute cortical infarction. No mass lesion is noted on this unenhanced scan. Ventricular size is stable from prior exam.  IMPRESSION: No acute intracranial abnormality. Stable atrophy and chronic white matter disease. No acute cortical infarction.   Electronically Signed   By: Natasha Mead M.D.   On: 02/20/2015 15:32   Dg Chest Port 1 View  02/24/2015   CLINICAL DATA:  Weakness, left lower quadrant pain  EXAM: PORTABLE CHEST - 1 VIEW  COMPARISON:  02/22/2015  FINDINGS: Cardiomegaly again noted. Status post median sternotomy. No acute infiltrate or pleural effusion. No pulmonary edema.  IMPRESSION: No active disease.   Electronically Signed   By: Natasha Mead M.D.   On: 02/24/2015 12:16   Dg Chest Port 1 View  02/22/2015   CLINICAL DATA:  80 year old male status post placement of cardiac device  EXAM: PORTABLE CHEST - 1 VIEW  COMPARISON:  Preoperative chest x-ray 02/20/2015  FINDINGS: Interval placement of a right subclavian approach single lead cardiac rhythm maintenance device. The lead projects over the right ventricle. Cardiomegaly with left heart enlargement. Patient is status post median sternotomy with evidence of prior multivessel CABG including LIMA bypass. Slightly increased pulmonary vascular congestion without overt edema. No evidence of  pneumothorax or large pleural effusion. Inspiratory volumes are slightly low with mild bibasilar atelectasis. No acute osseous abnormality.  IMPRESSION: 1. New right subclavian approach single lead cardiac rhythm maintenance device. The lead projects over the  right ventricle. 2. No evidence of pneumothorax. 3. Slightly increased pulmonary vascular congestion without overt edema. 4. Lower inspiratory volumes with bibasilar atelectasis.   Electronically Signed   By: Malachy Moan M.D.   On: 02/22/2015 12:13   Dg Chest Portable 1 View  02/20/2015   CLINICAL DATA:  Persistent diarrhea, bradycardia, history diabetes mellitus, hypertension, paroxysmal atrial fibrillation, coronary artery disease post MI and and CABG, ischemic cardiomyopathy  EXAM: PORTABLE CHEST - 1 VIEW  COMPARISON:  Portable exam 1329 hours compared to 02/16/2015  FINDINGS: Enlargement of cardiac silhouette post CABG.  Slight pulmonary vascular congestion.  Bibasilar atelectasis.  Lungs otherwise clear.  No pleural effusion or pneumothorax.  IMPRESSION: Enlargement of cardiac silhouette with pulmonary vascular congestion post CABG.  Mild bibasilar atelectasis.   Electronically Signed   By: Ulyses Southward M.D.   On: 02/20/2015 14:00   Dg Chest Port 1 View  02/16/2015   CLINICAL DATA:  Shortness of breath, CHF.  EXAM: PORTABLE CHEST - 1 VIEW  COMPARISON:  02/09/2015  FINDINGS: Cardiomegaly and CABG changes again noted.  Pulmonary vascular congestion with possible mild interstitial edema again noted.  There is no evidence of pneumothorax.  Mild bilateral lower lung atelectasis/airspace disease again noted.  IMPRESSION: Little significant change. Bilateral lower lung atelectasis/ airspace disease. Pneumonia not excluded.  Cardiomegaly with pulmonary vascular congestion and possible mild interstitial edema.   Electronically Signed   By: Harmon Pier M.D.   On: 02/16/2015 16:28   Dg Abd Portable 1v  02/24/2015   CLINICAL DATA:  Lower abdominal pain  and weakness  EXAM: PORTABLE ABDOMEN - 1 VIEW  COMPARISON:  None.  FINDINGS: Bowel gas pattern unremarkable. No obstruction or free air is seen on this supine examination. There is contrast in the rectum. There are vascular calcifications in the pelvis.  IMPRESSION: Bowel gas pattern unremarkable.   Electronically Signed   By: Bretta Bang III M.D.   On: 02/24/2015 12:18    CBC  Recent Labs Lab 02/20/15 1245 02/22/15 0900 02/23/15 0425 02/24/15 1129  WBC 9.0 9.2 7.4 8.3  HGB 11.7* 10.8* 10.8* 11.1*  HCT 37.8* 35.1* 33.4* 35.0*  PLT 341 240 192 188  MCV 93.8 90.7 91.8 89.5  MCH 29.0 27.9 29.7 28.4  MCHC 31.0 30.8 32.3 31.7  RDW 15.2 15.2 15.3 15.3  LYMPHSABS 1.0  --   --  0.4*  MONOABS 1.3*  --   --  0.7  EOSABS 0.1  --   --  0.0  BASOSABS 0.0  --   --  0.0    Chemistries   Recent Labs Lab 02/20/15 1245 02/21/15 1208 02/22/15 0900 02/23/15 0425 02/24/15 1129 02/25/15 0551 02/26/15 0358  NA 138  --  135 137 136 134* 134*  K 4.5  --  4.4 4.7 4.3 4.2 4.3  CL 103  --  101 103 102 102 102  CO2 26  --  GLUCOSE 161*  --  151* 104* 113* 106* 95  BUN 45*  --  31* 25* 22* 22* 22*  CREATININE 1.53*  --  1.32* 1.14 1.20 1.29* 1.27*  CALCIUM 8.8*  --  8.3* 8.4* 8.5* 8.8* 8.3*  MG  --  2.4  --   --   --   --   --   AST 31  --  20  --  25  --   --   ALT 66*  --  39  --  23  --   --  ALKPHOS 97  --  76  --  69  --   --   BILITOT 1.7*  --  1.8*  --  2.1*  --   --    ------------------------------------------------------------------------------------------------------------------ estimated creatinine clearance is 55.5 mL/min (by C-G formula based on Cr of 1.27). ------------------------------------------------------------------------------------------------------------------ No results for input(s): HGBA1C in the last 72 hours. ------------------------------------------------------------------------------------------------------------------ No results for  input(s): CHOL, HDL, LDLCALC, TRIG, CHOLHDL, LDLDIRECT in the last 72 hours. ------------------------------------------------------------------------------------------------------------------ No results for input(s): TSH, T4TOTAL, T3FREE, THYROIDAB in the last 72 hours.  Invalid input(s): FREET3 ------------------------------------------------------------------------------------------------------------------ No results for input(s): VITAMINB12, FOLATE, FERRITIN, TIBC, IRON, RETICCTPCT in the last 72 hours.  Coagulation profile  Recent Labs Lab 02/22/15 0900 02/23/15 0425 02/24/15 1129 02/25/15 0551 02/26/15 0358  INR 3.21* 3.09* 2.14* 1.88* 2.33*    No results for input(s): DDIMER in the last 72 hours.  Cardiac Enzymes  Recent Labs Lab 02/24/15 1826 02/25/15 0015 02/25/15 0551  TROPONINI 0.04* 0.05* 0.04*   ------------------------------------------------------------------------------------------------------------------ Invalid input(s): POCBNP   Pleas Koch, MD [facility] [phone]

## 2015-02-26 NOTE — Progress Notes (Signed)
ANTICOAGULATION CONSULT NOTE   Pharmacy Consult for Coumadin Indication: atrial fibrillation  No Known Allergies  Labs:  Recent Labs  02/24/15 1129 02/24/15 1826 02/25/15 0015 02/25/15 0551 02/26/15 0358  HGB 11.1*  --   --   --   --   HCT 35.0*  --   --   --   --   PLT 188  --   --   --   --   LABPROT 23.8*  --   --  21.5* 25.3*  INR 2.14*  --   --  1.88* 2.33*  CREATININE 1.20  --   --  1.29* 1.27*  TROPONINI  --  0.04* 0.05* 0.04*  --     Estimated Creatinine Clearance: 55.5 mL/min (by C-G formula based on Cr of 1.27).  Assessment: 80 y.o. male presents with weakness, SOB. Pt was just discharged home 5/22 after pacemaker placement on 5/20. During this hospitalization he also had diarrhea and was positive for cdiff. Originally treated with flagyl (5/20-5/21) but due to interaction between coumadin and flagyl, pt was switched to po vancomycin.   Anticoagulation: Coumadin PTA (2.5mg  daily) for afib. INR was supratherapeutic during part of last admit so plan was to send home on 1mg  daily with recheck in 2-3 days. INR down 3.09>1.88>2.33 today. (Last dose 5mg  on 5/16, 5/18 had 1mg  IV Vitamin K, last dose of Flagyl 5/21).  Goal of Therapy:  INR 2-3 Monitor platelets by anticoagulation protocol: Yes   Plan:  Coumadin 2.5 mg po x 1 tonight. Daily INR  Thank you. Okey Regal, PharmD [phone]   02/26/2015,8:45 AM

## 2015-02-26 NOTE — Progress Notes (Signed)
    Subjective: Grumpy.    Objective: Vital signs in last 24 hours: Temp:  [97.2 F (36.2 C)-97.9 F (36.6 C)] 97.9 F (36.6 C) (05/25 0630) Pulse Rate:  [62-73] 64 (05/25 0854) Resp:  [16-20] 16 (05/25 0630) BP: (118-137)/(53-78) 136/70 mmHg (05/25 0854) SpO2:  [94 %-99 %] 98 % (05/25 0630) Weight:  [198 lb 3.1 oz (89.9 kg)] 198 lb 3.1 oz (89.9 kg) (05/25 0630) Last BM Date: 02/24/15  Intake/Output from previous day: 05/24 0701 - 05/25 0700 In: 820 [P.O.:820] Out: 650 [Urine:650] Intake/Output this shift:    Medications Scheduled Meds: . amiodarone  100 mg Oral Daily  . atorvastatin  20 mg Oral q1800  . carvedilol  3.125 mg Oral BID WC  . finasteride  5 mg Oral Daily  . furosemide  40 mg Oral Daily  . lactose free nutrition  237 mL Oral TID BM  . levothyroxine  88 mcg Oral QAC breakfast  . losartan  50 mg Oral Daily  . saccharomyces boulardii  250 mg Oral BID  . sodium chloride  3 mL Intravenous Q12H  . vancomycin  125 mg Oral 4 times per day  . warfarin  2.5 mg Oral ONCE-1800  . Warfarin - Pharmacist Dosing Inpatient   Does not apply q1800   Continuous Infusions:  PRN Meds:.sodium chloride, acetaminophen, ALPRAZolam, alum & mag hydroxide-simeth, ondansetron (ZOFRAN) IV, sodium chloride  PE: General appearance: alert, cooperative and no distress Neck: no JVD Lungs: Decreased BS on the left. Heart: regular rate and rhythm and + Gallop Abdomen: +BS, nontender, soft.  Extremities: 1+ left >R LEE Pulses: 2+ and symmetric Skin: Warm and dry Neurologic: Grossly normal  Lab Results:   Recent Labs  02/24/15 1129  WBC 8.3  HGB 11.1*  HCT 35.0*  PLT 188   BMET  Recent Labs  02/24/15 1129 02/25/15 0551 02/26/15 0358  NA 136 134* 134*  K 4.3 4.2 4.3  CL 102 102 102  CO2 26 24 23   GLUCOSE 113* 106* 95  BUN 22* 22* 22*  CREATININE 1.20 1.29* 1.27*  CALCIUM 8.5* 8.8* 8.3*   PT/INR  Recent Labs  02/24/15 1129 02/25/15 0551 02/26/15 0358    LABPROT 23.8* 21.5* 25.3*  INR 2.14* 1.88* 2.33*     Assessment/Plan Active Problems:  Ischemic cardiomyopathy, EF 20%  SYSTOLIC HEART FAILURE, ACUTE ON CHRONIC Net fluids: +0.2L/-0.6L. Now on daily PO lasix. ARB. Continue PO lasix.SCr stable.     Atrial fibrillation On coumadin. INR therapeutic.  On BB.     C. difficile colitis Per TR[facility]   SP St Jude single chamber PPM on 5/20. The device is functioning properly. Pacer site looks good.    Generalized weakness  PT/OT evaluation recommends SNF   LOS: 2 days    HAGER, BRYAN PA-C 02/26/2015 9:41 AM  As above, patient denies chest pain or dyspnea; does describe weakness. Volume status improved. Plan to continue Cozaar, carvedilol and present dose of Lasix. Will need close fu of renal function following DC. As outlined previously he appears to be in underlying atrial fibrillation. Could proceed with cardioversion in the future if congestive heart failure not controlled with medications. Continue Coumadin. Further evaluation of generalized weakness per primary care. Pt should FU with Dr Graciela Husbands following DC; we will sign off; please call with questions. Olga Millers

## 2015-02-26 NOTE — Clinical Social Work Note (Signed)
Clinical Social Work Assessment  Patient Details  Name: [name] MRN: [id number] Date of Birth: 18-Apr-1935  Date of referral:  02/26/15               Reason for consult:  Facility Placement                Permission sought to share information with:  Case Manager, Customer service manager, Family Supports Permission granted to share information::  Yes, Verbal Permission Granted  Name::     Daughter: [name]  Agency::  [facility] (recent referral on 02/14/15)  Patient wife is also a current resident at facilty  Relationship::  Yes see above  Contact Information:     Housing/Transportation Living arrangements for the past 2 months:  [place] of Information:  Patient, Medical Team, Other (Comment Required) (Chart review) Patient Interpreter Needed:  None Criminal Activity/Legal Involvement Pertinent to Current Situation/Hospitalization:  No - Comment as needed Significant Relationships:  Adult Children, Other Family Members, Spouse Lives with:  Self Do you feel safe going back to the place where you live?  No (Patient very weak and deconditioned, needs SNF per PT and patient agreeable) Need for family participation in patient care:  Yes (Comment) (encouragement by daughter)  Care giving concerns:  Patient returns back to ED after leaving AMA on 5/18. Patient returned home, could not adequately care for himself (reports he had to crawl to the bathroom until daughter arrived) and came back to the hospital in less than 24 hours. Patient needing SNF at this time due to acute medical conditions causing increased weakness and lack of mobility.  He has been refusing, however after working with PT and medical team, patient finally agreeable to SNF where his wife currently is a resident.   Social Worker assessment / plan:  LCSW was made aware of patient in the ED upon his quick return. LCSW met with patient who refused services at that time reporting he just needs to get  better and wants to go home. LCSW honored wishes however while covering for unit CSW patient was approached again for SNF as team felt this was [place].  At this time patient is agreeable and requesting a SNF at [facility]  Per chart review:  (see below)   CSW spoke with Chryl Heck, SW [phone]. [phone] at [facility]. Melanie confirmed bed offer. Threasa Beards confirmed that the pt will be sharing a room with his wife [name]. CSW informed the pt's stepdaughter [name]. CSW will continue to follow and assist. This is from [place] from first admission 02/14/15.  Plan: LCSW completed new FL2 and faxed information to [facility] for review. Will call [facility] and see if offer is still available for patient to stay with his wife at SNF. [place] has been completed.   Placement process is in progress. FL2 on chart, please MD Sign.   Employment status:  Retired Health visitor, Managed Care PT Recommendations:  [place] / Referral to community resources:  [place]  Patient/Family's Response to care:  Agreeable at this time  Patient/Family's Understanding of and Emotional Response to Diagnosis, Current Treatment, and Prognosis:  Patient reports frustration as he wants to get well and go home. Lacking independence is a struggle for him and SNF was discussed as a method of regaining independence and mobility. Being with wife will also be a plus/motivation for him.  Emotional Assessment Appearance:  Appears  stated age Attitude/Demeanor/Rapport:  Apprehensive Affect (typically observed):  Guarded, Blunt Orientation:  Oriented to Self, Oriented to Place, Oriented to Situation Alcohol / Substance use:  Not Applicable Psych involvement (Current and /or in the community):  No (Comment)  Discharge Needs  Concerns to be addressed:  Adjustment to Illness, Care Coordination, Discharge Planning  Concerns Readmission within the last 30 days:  Yes Current discharge risk:  None Barriers to Discharge:  No Barriers Identified, Continued Medical Work up   [place], LCSW 02/26/2015, 8:25 AM

## 2015-02-26 NOTE — Progress Notes (Signed)
CSW was notified late this afternoon that patient's wife is was d/c'd home from [facility] and they have declined patient- even if his wife was still there. They do not feel that they could adequately his expectations and needs at the SNF.  CSW will discuss with patient in the morning. He has declined any SNF except for [facility] because his wife was there- thus- this CSW anticipates that he will now decide to return home when medically stable. Will meet with patient in the morning, then discuss his decision with MD and RNCM. Lorri Frederick. Jaci Lazier, [place] [phone]

## 2015-02-27 ENCOUNTER — Encounter: Payer: Self-pay | Admitting: *Deleted

## 2015-02-27 DIAGNOSIS — I5022 Chronic systolic (congestive) heart failure: Secondary | ICD-10-CM | POA: Diagnosis not present

## 2015-02-27 DIAGNOSIS — I5023 Acute on chronic systolic (congestive) heart failure: Secondary | ICD-10-CM | POA: Diagnosis not present

## 2015-02-27 DIAGNOSIS — Z950 Presence of cardiac pacemaker: Secondary | ICD-10-CM | POA: Diagnosis not present

## 2015-02-27 DIAGNOSIS — N183 Chronic kidney disease, stage 3 (moderate): Secondary | ICD-10-CM | POA: Diagnosis not present

## 2015-02-27 DIAGNOSIS — R531 Weakness: Secondary | ICD-10-CM | POA: Diagnosis not present

## 2015-02-27 DIAGNOSIS — I480 Paroxysmal atrial fibrillation: Secondary | ICD-10-CM | POA: Diagnosis not present

## 2015-02-27 DIAGNOSIS — R4189 Other symptoms and signs involving cognitive functions and awareness: Secondary | ICD-10-CM | POA: Diagnosis not present

## 2015-02-27 DIAGNOSIS — E038 Other specified hypothyroidism: Secondary | ICD-10-CM | POA: Diagnosis not present

## 2015-02-27 DIAGNOSIS — A047 Enterocolitis due to Clostridium difficile: Secondary | ICD-10-CM | POA: Diagnosis not present

## 2015-02-27 DIAGNOSIS — E1122 Type 2 diabetes mellitus with diabetic chronic kidney disease: Secondary | ICD-10-CM | POA: Diagnosis not present

## 2015-02-27 DIAGNOSIS — I509 Heart failure, unspecified: Secondary | ICD-10-CM | POA: Diagnosis not present

## 2015-02-27 DIAGNOSIS — F419 Anxiety disorder, unspecified: Secondary | ICD-10-CM | POA: Diagnosis not present

## 2015-02-27 DIAGNOSIS — E034 Atrophy of thyroid (acquired): Secondary | ICD-10-CM | POA: Diagnosis not present

## 2015-02-27 DIAGNOSIS — I482 Chronic atrial fibrillation: Secondary | ICD-10-CM | POA: Diagnosis not present

## 2015-02-27 DIAGNOSIS — Z7901 Long term (current) use of anticoagulants: Secondary | ICD-10-CM | POA: Diagnosis not present

## 2015-02-27 DIAGNOSIS — I255 Ischemic cardiomyopathy: Secondary | ICD-10-CM | POA: Diagnosis not present

## 2015-02-27 DIAGNOSIS — I5030 Unspecified diastolic (congestive) heart failure: Secondary | ICD-10-CM | POA: Diagnosis not present

## 2015-02-27 DIAGNOSIS — I5043 Acute on chronic combined systolic (congestive) and diastolic (congestive) heart failure: Secondary | ICD-10-CM | POA: Diagnosis not present

## 2015-02-27 DIAGNOSIS — J9600 Acute respiratory failure, unspecified whether with hypoxia or hypercapnia: Secondary | ICD-10-CM | POA: Diagnosis not present

## 2015-02-27 DIAGNOSIS — I252 Old myocardial infarction: Secondary | ICD-10-CM | POA: Diagnosis not present

## 2015-02-27 DIAGNOSIS — F329 Major depressive disorder, single episode, unspecified: Secondary | ICD-10-CM | POA: Diagnosis not present

## 2015-02-27 DIAGNOSIS — E785 Hyperlipidemia, unspecified: Secondary | ICD-10-CM | POA: Diagnosis not present

## 2015-02-27 LAB — BASIC METABOLIC PANEL
ANION GAP: 9 (ref 5–15)
BUN: 20 mg/dL (ref 6–20)
CALCIUM: 8.2 mg/dL — AB (ref 8.9–10.3)
CO2: 22 mmol/L (ref 22–32)
Chloride: 104 mmol/L (ref 101–111)
Creatinine, Ser: 1.27 mg/dL — ABNORMAL HIGH (ref 0.61–1.24)
GFR calc Af Amer: >60 mL/min (ref >60)
GFR, EST NON AFRICAN AMERICAN: 53 mL/min — AB (ref >60)
Glucose, Bld: 112 mg/dL — ABNORMAL HIGH (ref 65–99)
POTASSIUM: 4.4 mmol/L (ref 3.5–5.1)
SODIUM: 135 mmol/L (ref 135–145)

## 2015-02-27 LAB — PROTIME-INR
INR: 2.26 — ABNORMAL HIGH (ref 0.00–1.49)
PROTHROMBIN TIME: 24.8 s — AB (ref 11.6–15.2)

## 2015-02-27 MED ORDER — WARFARIN SODIUM 2.5 MG PO TABS
2.5000 mg | ORAL_TABLET | Freq: Every day | ORAL | Status: DC
  Filled 2015-02-27: qty 1

## 2015-02-27 MED ORDER — CARVEDILOL 3.125 MG PO TABS: 3.1250 mg | ORAL_TABLET | Freq: Two times a day (BID) | ORAL | Status: AC

## 2015-02-27 MED ORDER — ALPRAZOLAM 0.5 MG PO TABS: 0.5000 mg | ORAL_TABLET | Freq: Three times a day (TID) | ORAL | Status: DC | PRN

## 2015-02-27 MED ORDER — AMIODARONE HCL 200 MG PO TABS: 100.0000 mg | ORAL_TABLET | Freq: Every day | ORAL | Status: AC

## 2015-02-27 MED ORDER — FUROSEMIDE 40 MG PO TABS: 40.0000 mg | ORAL_TABLET | Freq: Every day | ORAL | Status: AC

## 2015-02-27 MED ORDER — ATORVASTATIN CALCIUM 20 MG PO TABS: 20.0000 mg | ORAL_TABLET | Freq: Every day | ORAL | Status: AC

## 2015-02-27 NOTE — Discharge Summary (Signed)
Physician Discharge Summary  [name] WUJ:[id number] DOB: 07-05-1935 DOA: 02/24/2015  PCP: Ezequiel Kayser, MD  Admit date: 02/24/2015 Discharge date: 02/27/2015  Time spent: 40 minutes  Recommendations for Outpatient Follow-up:  1. Needs basic metabolic panel, CBC, INR in about 4 days 2. Patient will need further education as an outpatient regarding low salt heart healthy diet-please arrange 3. Patient is to complete treatment of Clostridium difficile colitis with by mouth vancomycin solution on 03/03/15 which would be 10 days of empiric treatment 4. Recommend outpatient LFTs + TSH in about 2-3 weeks as patient is on chronic amiodarone--please note that his dosage of Synthroid was decreased during hospital stay because his TSH was depressed 5. Would recommend as an outpatient that patient follow-up with cardiology in about 1-2 months[ Dr. Graciela Husbands of EP]  to meds as below  continue Cozaar 50 mg  Lasix 40 daily,  Losartan increased at this admission to 50 mg daily  Discharge Diagnoses:  Active Problems:   Ischemic cardiomyopathy   SYSTOLIC HEART FAILURE, ACUTE ON CHRONIC   Atrial fibrillation   C. difficile colitis   CHF (congestive heart failure)   Acute on chronic systolic congestive heart failure   Weakness   Discharge Condition: Fair    Diet recommendation: Heart healthy low-salt   Filed Weights   02/24/15 1502 02/25/15 0218 02/26/15 0630  Weight: 90.8 kg (200 lb 2.8 oz) 91 kg (200 lb 9.9 oz) 89.9 kg (198 lb 3.1 oz)    History of present illness:  80 year old male with history of coronary artery disease status post CABG, ischemic cardiomyopathy with systolic heart failure with an EF of 20%, atrial fibrillation on anticoagulation at present to the emergency department for shortness of breath and weakness. He had recently been admitted for placement of a pacemaker which was done on 02/21/15, [employer], single-chamber  Patient also has a history of leaving AGAINST MEDICAL  ADVICE and refusing skilled nursing facility despite recommendations that this was probably the safest option for him  Patient admitted for generalized weakness as well as acute respiratory failure with heart failure. Cardiology consulted during hospital admission   Hospital Course:   Generalized weakness -multifactorial: mild acute on chronic systolic heart failure and C. Diff colitis -Diuresis as below -Ongoing physical and occupational therapy at SNF which he now agrees to  Acute respiratory failure/ chronic systolic and diastolic CHF -EF 95% -Monitor daily weights, intake and output at SNF -might need adjustment of lasix regimen if gains >2 lbs 24 hour period -Cardiology consulted and appreciated -Needs outpatient follow-up with Dr. Romeo Rabon arrange in about one month  Hypothyroidism -TSH 0.047, fT4 3.33, fT3 2.5 on 02/16/2015 c/w too much synthroid -Decrease synthroid to 88 mcg -Needs repeat TSH in about 2-3 weeks  Paroxysmal atrial fibrillation/complete heart block/PPM/ICD placement -CHADs2vasc 5  -Continue amiodarone -Outpatient LFTs to be done about the same time his TSH as on amiodarone -Warfarin per pharmacy  CAD -Currently patient is chest pain free -Not on ASA as on warfarin due to bleeding risk -Continue statin, coreg, cozaar   C. Diff diarrhea -Continue oral vancomycin, needs to stop taking on 5/30 which would be 10 days of empiric treatment  BPH  -continue finasteride  Anxiety -continue prn xanax  Severe protein calorie malnutrition -Continue supplements -Nutrition consultation   Consultations: Cardiology  Discharge Exam: Filed Vitals:   02/27/15 1007  BP: 110/58  Pulse:   Temp:   Resp:     General: Alert and grumpy and oriented multiple somatic  complaints However has had breakfast, has had no diarrea and no fever nor chills  Cardiovascular: S1-S2 A. fib by exam  Respiratory: Clinically clear    Discharge  Instructions   Discharge Instructions    Diet - low sodium heart healthy    Complete by:  As directed      Increase activity slowly    Complete by:  As directed           Current Discharge Medication List    START taking these medications   Details  atorvastatin (LIPITOR) 20 MG tablet Take 1 tablet (20 mg total) by mouth daily at 6 PM.    carvedilol (COREG) 3.125 MG tablet Take 1 tablet (3.125 mg total) by mouth 2 (two) times daily with a meal.    furosemide (LASIX) 40 MG tablet Take 1 tablet (40 mg total) by mouth daily. Qty: 30 tablet      CONTINUE these medications which have CHANGED   Details  ALPRAZolam (XANAX) 0.5 MG tablet Take 1 tablet (0.5 mg total) by mouth 3 (three) times daily as needed for anxiety. Qty: 9 tablet, Refills: 0    amiodarone (PACERONE) 200 MG tablet Take 0.5 tablets (100 mg total) by mouth daily. Qty: 15 tablet, Refills: 6      CONTINUE these medications which have NOT CHANGED   Details  finasteride (PROSCAR) 5 MG tablet Take 5 mg by mouth daily.     lactose free nutrition (BOOST) LIQD Take 237 mLs by mouth 3 (three) times daily between meals.    levothyroxine (SYNTHROID, LEVOTHROID) 125 MCG tablet Take 125 mcg by mouth daily.    losartan (COZAAR) 50 MG tablet Take 0.5 tablets (25 mg total) by mouth daily. Qty: 30 tablet, Refills: 6    saccharomyces boulardii (FLORASTOR) 250 MG capsule Take 1 capsule (250 mg total) by mouth 2 (two) times daily.    vancomycin (VANCOCIN) 50 mg/mL oral solution Take 2.5 mLs (125 mg total) by mouth 4 (four) times daily. Take for 12 days then stop. Qty: 120 mL, Refills: 0    warfarin (COUMADIN) 1 MG tablet Take 1 tablet (1 mg total) by mouth daily. Take until seen in coumadin clinic. Qty: 30 tablet, Refills: 0       No Known Allergies    The results of significant diagnostics from this hospitalization (including imaging, microbiology, ancillary and laboratory) are listed below for reference.     Significant Diagnostic Studies: Ct Abdomen Pelvis Wo Contrast  02/20/2015   CLINICAL DATA:  Persistent diarrhea.  Bradycardia.  EXAM: CT ABDOMEN AND PELVIS WITHOUT CONTRAST  TECHNIQUE: Multidetector CT imaging of the abdomen and pelvis was performed following the standard protocol without IV contrast.  COMPARISON:  08/09/2011 report, images not available  FINDINGS: Coronary artery calcifications with post CABG changes. Bilateral pleural effusions with compressive atelectasis. Pleural effusions are small to moderate in size. Negative for free air.  Unenhanced CT was performed per clinician order. Lack of IV contrast limits sensitivity and specificity, especially for evaluation of abdominal/pelvic solid viscera.  There is a small amount of perihepatic and perisplenic ascites. The gallbladder is mildly distended with high-density material suggestive for sludge and cannot exclude gallstones. No gross abnormality to the spleen or pancreas. Normal appearance of the adrenal glands and kidneys. Negative for kidney stones or hydronephrosis. There is a small amount of fluid or edema around the duodenum. No significant lymphadenopathy. There is edema or stranding in the lower abdomen around the iliac vessels, particularly on the left  side. This appears to be extraperitoneal fluid. There is diffuse subcutaneous edema. There is edema in the presacral region. No gross abnormality to the prostate or urinary bladder.  No gross abnormality to the small or large bowel. A normal appearing appendix.  Disc space narrowing and disease at L4-L5. No acute bone abnormality.  IMPRESSION: Bilateral pleural effusions with a small amount of intra-abdominal fluid and extensive subcutaneous edema. Findings are suggestive for anasarca.  The gallbladder is distended with high-density material that could represent sludge. Gallbladder could be further characterized with ultrasound.   Electronically Signed   By: Richarda Overlie M.D.   On: 02/20/2015  15:32   Ct Head Wo Contrast  02/20/2015   CLINICAL DATA:  LOC, diarrhea  EXAM: CT HEAD WITHOUT CONTRAST  TECHNIQUE: Contiguous axial images were obtained from the base of the skull through the vertex without intravenous contrast.  COMPARISON:  12/04/2009  FINDINGS: No skull fracture is noted. Paranasal sinuses and mastoid air cells are unremarkable. Atherosclerotic calcifications of carotid siphon again noted. Stable atrophy and chronic white matter disease. No acute cortical infarction. No mass lesion is noted on this unenhanced scan. Ventricular size is stable from prior exam.  IMPRESSION: No acute intracranial abnormality. Stable atrophy and chronic white matter disease. No acute cortical infarction.   Electronically Signed   By: Natasha Mead M.D.   On: 02/20/2015 15:32   Dg Chest Port 1 View  02/24/2015   CLINICAL DATA:  Weakness, left lower quadrant pain  EXAM: PORTABLE CHEST - 1 VIEW  COMPARISON:  02/22/2015  FINDINGS: Cardiomegaly again noted. Status post median sternotomy. No acute infiltrate or pleural effusion. No pulmonary edema.  IMPRESSION: No active disease.   Electronically Signed   By: Natasha Mead M.D.   On: 02/24/2015 12:16   Dg Chest Port 1 View  02/22/2015   CLINICAL DATA:  80 year old male status post placement of cardiac device  EXAM: PORTABLE CHEST - 1 VIEW  COMPARISON:  Preoperative chest x-ray 02/20/2015  FINDINGS: Interval placement of a right subclavian approach single lead cardiac rhythm maintenance device. The lead projects over the right ventricle. Cardiomegaly with left heart enlargement. Patient is status post median sternotomy with evidence of prior multivessel CABG including LIMA bypass. Slightly increased pulmonary vascular congestion without overt edema. No evidence of pneumothorax or large pleural effusion. Inspiratory volumes are slightly low with mild bibasilar atelectasis. No acute osseous abnormality.  IMPRESSION: 1. New right subclavian approach single lead cardiac  rhythm maintenance device. The lead projects over the right ventricle. 2. No evidence of pneumothorax. 3. Slightly increased pulmonary vascular congestion without overt edema. 4. Lower inspiratory volumes with bibasilar atelectasis.   Electronically Signed   By: Malachy Moan M.D.   On: 02/22/2015 12:13   Dg Chest Portable 1 View  02/20/2015   CLINICAL DATA:  Persistent diarrhea, bradycardia, history diabetes mellitus, hypertension, paroxysmal atrial fibrillation, coronary artery disease post MI and and CABG, ischemic cardiomyopathy  EXAM: PORTABLE CHEST - 1 VIEW  COMPARISON:  Portable exam 1329 hours compared to 02/16/2015  FINDINGS: Enlargement of cardiac silhouette post CABG.  Slight pulmonary vascular congestion.  Bibasilar atelectasis.  Lungs otherwise clear.  No pleural effusion or pneumothorax.  IMPRESSION: Enlargement of cardiac silhouette with pulmonary vascular congestion post CABG.  Mild bibasilar atelectasis.   Electronically Signed   By: Ulyses Southward M.D.   On: 02/20/2015 14:00   Dg Chest Port 1 View  02/16/2015   CLINICAL DATA:  Shortness of breath, CHF.  EXAM:  PORTABLE CHEST - 1 VIEW  COMPARISON:  02/09/2015  FINDINGS: Cardiomegaly and CABG changes again noted.  Pulmonary vascular congestion with possible mild interstitial edema again noted.  There is no evidence of pneumothorax.  Mild bilateral lower lung atelectasis/airspace disease again noted.  IMPRESSION: Little significant change. Bilateral lower lung atelectasis/ airspace disease. Pneumonia not excluded.  Cardiomegaly with pulmonary vascular congestion and possible mild interstitial edema.   Electronically Signed   By: Harmon Pier M.D.   On: 02/16/2015 16:28   Dg Abd Portable 1v  02/24/2015   CLINICAL DATA:  Lower abdominal pain and weakness  EXAM: PORTABLE ABDOMEN - 1 VIEW  COMPARISON:  None.  FINDINGS: Bowel gas pattern unremarkable. No obstruction or free air is seen on this supine examination. There is contrast in the rectum.  There are vascular calcifications in the pelvis.  IMPRESSION: Bowel gas pattern unremarkable.   Electronically Signed   By: Bretta Bang III M.D.   On: 02/24/2015 12:18    Microbiology: Recent Results (from the past 240 hour(s))  Clostridium Difficile by PCR     Status: Abnormal   Collection Time: 02/21/15 10:25 AM  Result Value Ref Range Status   C difficile by pcr POSITIVE (A) NEGATIVE Final    Comment: CRITICAL RESULT CALLED TO, READ BACK BY AND VERIFIED WITH: T. FAIRING RN 11:40 02/21/15 ([email or web address])   Blood culture (routine x 2)     Status: None (Preliminary result)   Collection Time: 02/24/15 11:29 AM  Result Value Ref Range Status   Specimen Description BLOOD ARM LEFT  Final   Special Requests BOTTLES DRAWN AEROBIC AND ANAEROBIC 5CC  Final   Culture   Final           BLOOD CULTURE RECEIVED NO GROWTH TO DATE CULTURE WILL BE HELD FOR 5 DAYS BEFORE ISSUING A FINAL NEGATIVE REPORT Performed at [facility]    Report Status PENDING  Incomplete  Blood culture (routine x 2)     Status: None (Preliminary result)   Collection Time: 02/24/15 11:31 AM  Result Value Ref Range Status   Specimen Description BLOOD ARM RIGHT  Final   Special Requests BOTTLES DRAWN AEROBIC ONLY 5CC  Final   Culture   Final           BLOOD CULTURE RECEIVED NO GROWTH TO DATE CULTURE WILL BE HELD FOR 5 DAYS BEFORE ISSUING A FINAL NEGATIVE REPORT Performed at [facility]    Report Status PENDING  Incomplete  Clostridium Difficile by PCR     Status: None   Collection Time: 02/25/15 12:22 PM  Result Value Ref Range Status   C difficile by pcr NEGATIVE NEGATIVE Final     Labs: Basic Metabolic Panel:  Recent Labs Lab 02/21/15 1208  02/23/15 0425 02/24/15 1129 02/25/15 0551 02/26/15 0358 02/27/15 0409  NA  --   < > 137 136 134* 134* 135  K  --   < > 4.7 4.3 4.2 4.3 4.4  CL  --   < > 103 102 102 102 104  CO2  --   < > 28 26 24 23 22   GLUCOSE  --   < > 104* 113* 106* 95 112*  BUN   --   < > 25* 22* 22* 22* 20  CREATININE  --   < > 1.14 1.20 1.29* 1.27* 1.27*  CALCIUM  --   < > 8.4* 8.5* 8.8* 8.3* 8.2*  MG 2.4  --   --   --   --   --   --   < > =  values in this interval not displayed. Liver Function Tests:  Recent Labs Lab 02/20/15 1245 02/22/15 0900 02/24/15 1129  AST 31 20 25   ALT 66* 39 23  ALKPHOS 97 76 69  BILITOT 1.7* 1.8* 2.1*  PROT 6.4* 5.5* 5.7*  ALBUMIN 3.4* 2.8* 3.1*    Recent Labs Lab 02/24/15 1129  LIPASE 28   No results for input(s): AMMONIA in the last 168 hours. CBC:  Recent Labs Lab 02/20/15 1245 02/22/15 0900 02/23/15 0425 02/24/15 1129  WBC 9.0 9.2 7.4 8.3  NEUTROABS 6.7  --   --  7.2  HGB 11.7* 10.8* 10.8* 11.1*  HCT 37.8* 35.1* 33.4* 35.0*  MCV 93.8 90.7 91.8 89.5  PLT 341 240 192 188   Cardiac Enzymes:  Recent Labs Lab 02/20/15 1245 02/21/15 0015 02/24/15 1826 02/25/15 0015 02/25/15 0551  TROPONINI 0.03 0.05* 0.04* 0.05* 0.04*   BNP: BNP (last 3 results)  Recent Labs  02/17/15 0400 02/24/15 1129  BNP 1599.2* 1936.4*    ProBNP (last 3 results) No results for input(s): PROBNP in the last 8760 hours.  CBG:  Recent Labs Lab 02/21/15 1627 02/21/15 2120 02/22/15 1211 02/22/15 1628 02/22/15 2212  GLUCAP 126* 141* 113* 113* 129*       Signed:  Rhetta Mura  [facility] 02/27/2015, 10:35 AM

## 2015-02-27 NOTE — Consult Note (Signed)
   [facility] Inpatient Consult   02/27/2015  [name] [facility] [birth year] [id number] Referral follow up.  Spoke with [facility], [name],  regarding patient's disposition for skilled facility prior to returning home.  Have requested for [facility] Care Management social worker to follow up for transition to home needs with the Skilled nursing facility. Of note, [facility] Care Management does not replace or interfere with any services arranged for this patient with the inpatient care management team.  For questions, please contact: Charlesetta Shanks, RN BSN CCM [facility]  [phone] business mobile phone

## 2015-02-27 NOTE — Clinical Social Work Placement (Deleted)
   CLINICAL SOCIAL WORK PLACEMENT  NOTE  Date:  02/27/2015  Patient Details  Name: [name] MRN: [id number] Date of Birth: April 17, 1935  Clinical Social Work is seeking post-discharge placement for this patient at the Skilled  Nursing Facility level of care (*CSW will initial, date and re-position this form in  chart as items are completed):  Yes   Patient/family provided with [place] Clinical Social Work Department's list of facilities offering this level of care within the geographic area requested by the patient (or if unable, by the patient's family).  Yes   Patient/family informed of their freedom to choose among providers that offer the needed level of care, that participate in Medicare, Medicaid or managed care program needed by the patient, have an available bed and are willing to accept the patient.  Yes   Patient/family informed of [facility]'s ownership interest in [facility] and [facility], as well as of the fact that they are under no obligation to receive care at these facilities.  PASRR submitted to EDS on 02/26/15     PASRR number received on 02/26/15     Existing PASRR number confirmed on 02/26/15     FL2 transmitted to all facilities in geographic area requested by pt/family on 02/26/15     FL2 transmitted to all facilities within larger geographic area on       Patient informed that his/her managed care company has contracts with or will negotiate with certain facilities, including the following:   (NA- has Medicare)     Yes   Patient/family informed of bed offers received.  Patient [facility] bed at [facility]     Physician recommends and patient chooses bed at      Patient to be transferred to [facility] on 02/27/15.  Patient to be transferred to facility by [facility])     Patient family notified on 02/27/15 of transfer.  Name of family member notified:  [name]- Stepdaughter     PHYSICIAN Please sign FL2, Please prepare priority discharge summary, including medications, Please prepare prescriptions, Please sign DNR     Additional Comment:  Ok per MD for d/c today to SNF. Patient agreed to additional SNF search and indicated a desire to stay in [place] where he can be close to his wife and stepdaughter [name]. Patient requested that [name] be notified of his placement and CSW called her. She was appreciative of the call and stated that she is caring for her mother and her uncle- she is unable to provide in home care for patient.  She agreed that she would check on him and provide as much support as she was able.  Bed offers provided to patient- he did not have a preference but chose [facility] because it was "near the hospital".  Reyne Dumas, RN Liaison is aware and arranged for a private room for patient.  He was extremely pleased with this. Nursing notified to call report.  No further CSW need identified. CSW signing off.    _______________________________________________ Darylene Price, LCSW 02/27/2015, 5:14 PM

## 2015-02-27 NOTE — Clinical Social Work Placement (Signed)
   CLINICAL SOCIAL WORK PLACEMENT  NOTE  Date:  02/27/2015  Patient Details  Name: [name] MRN: [id number] Date of Birth: 1935/06/25  Clinical Social Work is seeking post-discharge placement for this patient at the Skilled  Nursing Facility level of care (*CSW will initial, date and re-position this form in  chart as items are completed):  Yes   Patient/family provided with [place] Clinical Social Work Department's list of facilities offering this level of care within the geographic area requested by the patient (or if unable, by the patient's family).  Yes   Patient/family informed of their freedom to choose among providers that offer the needed level of care, that participate in Medicare, Medicaid or managed care program needed by the patient, have an available bed and are willing to accept the patient.  Yes   Patient/family informed of [place]'s ownership interest in [facility] and [facility], as well as of the fact that they are under no obligation to receive care at these facilities.  PASRR submitted to EDS on 02/26/15     PASRR number received on 02/26/15     Existing PASRR number confirmed on 02/26/15     FL2 transmitted to all facilities in geographic area requested by pt/family on 02/26/15     FL2 transmitted to all facilities within larger geographic area on       Patient informed that his/her managed care company has contracts with or will negotiate with certain facilities, including the following:   (NA- has Medicare)     Yes   Patient/family informed of bed offers received.  Patient chooses bed at [facility]     Physician recommends and patient chooses bed at      Patient to be transferred to [facility] on 02/27/15.  Patient to be transferred to facility by [facility])     Patient family notified on 02/27/15 of transfer.  Name of family member notified:  [name]- Stepdaughter     PHYSICIAN Please sign FL2, Please prepare priority discharge summary, including medications, Please prepare prescriptions, Please sign DNR     Additional Comment:    _______________________________________________ Darylene Price, LCSW 02/27/2015, 5:29 PM

## 2015-02-27 NOTE — Consult Note (Signed)
02/27/15-  Reviewed notes in epic, spoke with Lovette Cliche-  Inpatient- about discharge plans, pt is going to [facility] later today and pt hopes this will be short term as pt goal is to return home, telephone call to [facility] hospital liason Charlesetta Shanks and discussed discharge plans and liason will send order for CSW to follow while pt is in SNF, RN CM will keep case open for 10 days and collaborate with CSW on plans for pt returning home.  [name] [facility], BSN [facility] Care Coordinator [phone]

## 2015-02-27 NOTE — Progress Notes (Signed)
Occupational Therapy Treatment Patient Details Name: [name] MRN: [id number] DOB: December 25, 1934 Today's Date: 02/27/2015    History of present illness 80 year old male with history of coronary artery disease status post CABG, ischemic cardiomyopathy with systolic heart failure with an EF of 20%, atrial fibrillation on anticoagulation at present to the emergency department for shortness of breath and weakness. Patient also has a history of leaving AGAINST MEDICAL ADVICE and refusing skilled nursing facility. He recently had pacemaker placed. Patient admitted for generalized weakness as well as acute respiratory failure with heart failure.   OT comments    Follow Up Recommendations  Other (comment);SNF (recommend SNF but pt states he is going home)    Equipment Recommendations  Other (comment);None recommended by OT (RW )       Precautions / Restrictions Precautions Precautions: Fall Precaution Comments: Pt left day before and fell in bathroom same day Restrictions Weight Bearing Restrictions: No       Mobility Bed Mobility Overal bed mobility: Needs Assistance Bed Mobility: Supine to Sit;Sit to Supine     Supine to sit: Min assist Sit to supine: Min assist      Transfers Overall transfer level: Needs assistance Equipment used: 1 person hand held assist   Sit to Stand: Min assist                  ADL Overall ADL's : Needs assistance/impaired     Grooming: Oral care;Min guard;Standing                   Toilet Transfer: Minimal assistance;Ambulation;RW   Toileting- Architect and Hygiene: Min guard;Sit to/from stand       Functional mobility during ADLs: Moderate assistance General ADL Comments: pt states he know he has to go to rehab. pt more participative this day      Vision                     Perception     Praxis      Cognition   Behavior During Therapy: [facility] for tasks assessed/performed Overall Cognitive  Status: Within Functional Limits for tasks assessed                       Extremity/Trunk Assessment                          Pertinent Vitals/ Pain       Pain Assessment: No/denies pain  Home Living                                          Prior Functioning/Environment              Frequency Min 2X/week     Progress Toward Goals  OT Goals(current goals can now be found in the care plan section)  Progress towards OT goals: Progressing toward goals     Plan      Co-evaluation                 End of Session Equipment Utilized During Treatment: Gait belt;Rolling walker   Activity Tolerance Patient tolerated treatment well   Patient Left in chair;with call bell/phone within reach;with chair alarm set   Nurse Communication Mobility status        Time: 2878-6767 OT Time Calculation (min): 16  min  Charges: OT General Charges $OT Visit: 1 Procedure OT Treatments $Self Care/Home Management : 8-22 mins  Juley Giovanetti, [place]ung 02/27/2015, 10:21 AM

## 2015-02-27 NOTE — Progress Notes (Signed)
ANTICOAGULATION CONSULT NOTE   Pharmacy Consult for Coumadin Indication: atrial fibrillation  No Known Allergies  Labs:  Recent Labs  02/24/15 1129 02/24/15 1826 02/25/15 0015 02/25/15 0551 02/26/15 0358 02/27/15 0409  HGB 11.1*  --   --   --   --   --   HCT 35.0*  --   --   --   --   --   PLT 188  --   --   --   --   --   LABPROT 23.8*  --   --  21.5* 25.3* 24.8*  INR 2.14*  --   --  1.88* 2.33* 2.26*  CREATININE 1.20  --   --  1.29* 1.27* 1.27*  TROPONINI  --  0.04* 0.05* 0.04*  --   --     Estimated Creatinine Clearance: 55.5 mL/min (by C-G formula based on Cr of 1.27).  Assessment: 80 y.o. male presents with weakness, SOB. Pt was just discharged home 5/22 after pacemaker placement on 5/20. During this hospitalization he also had diarrhea and was positive for cdiff. Originally treated with flagyl (5/20-5/21) but due to interaction between coumadin and flagyl, pt was switched to po vancomycin.   Anticoagulation: Coumadin PTA (2.5mg  daily) for afib. INR was supratherapeutic during part of last admit so plan was to send home on 1mg  daily with recheck in 2-3 days. INR down 3.09>1.88>2.33>2.26 today. (Last dose 5mg  on 5/16, 5/18 had 1mg  IV Vitamin K, last dose of Flagyl 5/21).  Goal of Therapy:  INR 2-3 Monitor platelets by anticoagulation protocol: Yes   Plan:  Coumadin 2.5 mg po daily (recommend at dc with follow up INR next week). Daily INR  Thank you. Okey Regal, PharmD [phone]   02/27/2015,10:48 AM

## 2015-02-27 NOTE — Progress Notes (Signed)
Report called to RN at [facility]. Pt d/c'd in no distress

## 2015-02-28 ENCOUNTER — Telehealth: Payer: Self-pay

## 2015-02-28 NOTE — Telephone Encounter (Signed)
New Admission to SNF  Current PT/INR 1.92 Current dose- 1 mg daily for Afib, no previous PT/INR due to new admission

## 2015-02-28 NOTE — Telephone Encounter (Signed)
S/w Gaylyn Rong at [facility]. Continue current coumadin dose and reck in 1 week. Goal INR 2-3 for afib

## 2015-03-02 LAB — CULTURE, BLOOD (ROUTINE X 2)
CULTURE: NO GROWTH
Culture: NO GROWTH

## 2015-03-04 ENCOUNTER — Encounter: Payer: Self-pay | Admitting: Internal Medicine

## 2015-03-04 ENCOUNTER — Non-Acute Institutional Stay (SKILLED_NURSING_FACILITY): Payer: Medicare Other | Admitting: Internal Medicine

## 2015-03-04 DIAGNOSIS — I5022 Chronic systolic (congestive) heart failure: Secondary | ICD-10-CM

## 2015-03-04 DIAGNOSIS — I480 Paroxysmal atrial fibrillation: Secondary | ICD-10-CM

## 2015-03-04 DIAGNOSIS — A047 Enterocolitis due to Clostridium difficile: Secondary | ICD-10-CM | POA: Diagnosis not present

## 2015-03-04 DIAGNOSIS — Z950 Presence of cardiac pacemaker: Secondary | ICD-10-CM

## 2015-03-04 DIAGNOSIS — N183 Chronic kidney disease, stage 3 (moderate): Secondary | ICD-10-CM | POA: Diagnosis not present

## 2015-03-04 DIAGNOSIS — I255 Ischemic cardiomyopathy: Secondary | ICD-10-CM | POA: Diagnosis not present

## 2015-03-04 DIAGNOSIS — A0472 Enterocolitis due to Clostridium difficile, not specified as recurrent: Secondary | ICD-10-CM

## 2015-03-04 DIAGNOSIS — E1122 Type 2 diabetes mellitus with diabetic chronic kidney disease: Secondary | ICD-10-CM | POA: Diagnosis not present

## 2015-03-04 NOTE — Progress Notes (Signed)
Patient ID: [name], male   DOB: 1935-03-30, 81 y.o.   MRN: [id number]    HISTORY AND PHYSICAL   DATE: 03/04/15  Location:  [place] [facility] Service: SNF [phone])   Extended Emergency Contact Information Primary Emergency Contact: Bucaro,Betty R Address: [address]          [place], [place] [address] Johnnette Litter of [place] Phone: [phone] Relation: Spouse Secondary Emergency Contact: Mardelle Matte, [place] [address] Johnnette Litter of [place] Phone: [phone] Mobile Phone: [phone] Relation: Daughter  Advanced Directive information  FULL CODE  Chief Complaint  Patient presents with  . New Admit To SNF    HPI:  80 yo male seen today as a new admission into SNF following hospital stay for C diff colitis, afib s/p pacer, ICM w EF 20% and acute/chronic CHF. Hospital records reviewed. Cardiac arryhthmia w/u revealed PAF/complete heart block and pacer/ICD (due to reduced EF)  placed. BP meds adjusted prior to d/c. He remains on Amiodarone and coumadin.  He c/o nausea but no further diarrhea. He completed 10 days of po Vanco on yesterday. Appetite reduced. No abdominal pain. No f/c. No nursing issues. Rehab plans include PT/OT/ST short term.  He takes prn alprazolam for anxiety  DM - diet controlled. CBGs 100-110s in hospital  Thyroid - stable on levothyroxine  Past Medical History  Diagnosis Date  . Ischemic cardiomyopathy     a. prior CRTD implantation with subcutaneous array b. s/p device system extraction 2/2 infection  . VT (ventricular tachycardia)     a. s/p ablation  . Anxiety   . Depression   . Permanent atrial fibrillation   . Hyperlipidemia   . Gynecomastia   . BPH (benign prostatic hypertrophy)   . Diabetes mellitus   . Hypertension   . Degenerative joint disease   . GERD (gastroesophageal reflux disease)   . CAD (coronary artery disease)     a. s/p CABG    Past Surgical History  Procedure  Laterality Date  . Cardiac defibrillator removal    . Coronary artery bypass graft  2000    X5  . Cardiac catheterization  12/25/2008    THE LEFT VENTRICLE WAS ENLARGED. THERE IS ANTERIOR APICAL AND INFERIOR APICAL AKINESIA. EF ESTIMATED 20%. NO MITRAL REGURGITATION.  . Cardiac catheterization  12/2008    REPEAT CATH, SHOWED BYPASS GRAFTS WERE PATENT  . US echocardiography  2007    EF 20 to 25%  . Ablation of dysrhythmic focus    . Pars plana vitrectomy  09/24/2011    Procedure: PARS PLANA VITRECTOMY WITH 25 GAUGE;  Surgeon: Clent Demark Rankin;  Location: [facility];  Service: Ophthalmology;  Laterality: Left;  MEMBRANE PEEL, SILICONE OIL  . Cardioversion N/A 11/20/2012    Procedure: CARDIOVERSION;  Surgeon: Fay Records, MD;  Location: [place];  Service: Cardiovascular;  Laterality: N/A;  . Cardioversion N/A 12/28/2012    Procedure: CARDIOVERSION;  Surgeon: Darlin Coco, MD;  Location: [facility] ENDOSCOPY;  Service: Cardiovascular;  Laterality: N/A;  . Cataract extraction, bilateral    . Ep implantable device N/A 02/21/2015    Procedure: Pacemaker Implant;  Surgeon: Thompson Grayer, MD;  Location: [facility] CV LAB;  Service: Cardiovascular;  Laterality: N/A;    Patient Care Team: Crist Infante, MD as PCP - General (Internal Medicine) Kassie Mends, RN as [place] Management  History   Social History  .  Marital Status: Married    Spouse Name: N/A  . Number of Children: N/A  . Years of Education: N/A   Occupational History  . retired     Youth worker   Social History Main Topics  . Smoking status: Never Smoker   . Smokeless tobacco: Never Used  . Alcohol Use: No     Comment: hx of in younger years, not now  . Drug Use: No  . Sexual Activity: Not Currently   Other Topics Concern  . Not on file   Social History Narrative     reports that he has never smoked. He has never used smokeless tobacco. He reports that he does not drink alcohol or use illicit  drugs.  Family History  Problem Relation Age of Onset  . Heart failure Mother   . Heart disease    . Heart failure Sister   . Heart failure Father    Family Status  Relation Status Death Age  . Mother Deceased     DM  . Father Deceased     CAD  . Sister Deceased     2, CAD  . Brother Alive     2,   . Child Deceased     brain CA  . Child Alive     2 biological, 1 adopted: healthy     There is no immunization history on file for this patient.  No Known Allergies  Medications: Patient's Medications  New Prescriptions   No medications on file  Previous Medications   ALPRAZOLAM (XANAX) 0.5 MG TABLET    Take 1 tablet (0.5 mg total) by mouth 3 (three) times daily as needed for anxiety.   AMIODARONE (PACERONE) 200 MG TABLET    Take 0.5 tablets (100 mg total) by mouth daily.   ATORVASTATIN (LIPITOR) 20 MG TABLET    Take 1 tablet (20 mg total) by mouth daily at 6 PM.   CARVEDILOL (COREG) 3.125 MG TABLET    Take 1 tablet (3.125 mg total) by mouth 2 (two) times daily with a meal.   FINASTERIDE (PROSCAR) 5 MG TABLET    Take 5 mg by mouth daily.    FUROSEMIDE (LASIX) 40 MG TABLET    Take 1 tablet (40 mg total) by mouth daily.   LACTOSE FREE NUTRITION (BOOST) LIQD    Take 237 mLs by mouth 3 (three) times daily between meals.   LEVOTHYROXINE (SYNTHROID, LEVOTHROID) 125 MCG TABLET    Take 125 mcg by mouth daily.   LOSARTAN (COZAAR) 50 MG TABLET    Take 0.5 tablets (25 mg total) by mouth daily.   SACCHAROMYCES BOULARDII (FLORASTOR) 250 MG CAPSULE    Take 1 capsule (250 mg total) by mouth 2 (two) times daily.   VANCOMYCIN (VANCOCIN) 50 MG/ML ORAL SOLUTION    Take 2.5 mLs (125 mg total) by mouth 4 (four) times daily. Take for 12 days then stop.   WARFARIN (COUMADIN) 1 MG TABLET    Take 1 tablet (1 mg total) by mouth daily. Take until seen in coumadin clinic.  Modified Medications   No medications on file  Discontinued Medications   No medications on file    Review of Systems   Constitutional: Negative for fever, chills, activity change and fatigue.  HENT: Negative for sore throat and trouble swallowing.   Eyes: Negative for visual disturbance.  Respiratory: Negative for cough, chest tightness and shortness of breath.   Cardiovascular: Negative for chest pain, palpitations and leg swelling.  Gastrointestinal: Positive for  nausea. Negative for vomiting, abdominal pain, diarrhea and blood in stool.  Genitourinary: Negative for urgency, frequency and difficulty urinating.  Musculoskeletal: Negative for arthralgias and gait problem.  Skin: Negative for rash.  Neurological: Negative for weakness and headaches.  Psychiatric/Behavioral: Negative for confusion and sleep disturbance. The patient is not nervous/anxious.     Filed Vitals:   03/04/15 1557  BP: 144/75  Pulse: 66  Temp: 97.5 F (36.4 C)  Weight: 178 lb (80.74 kg)   Body mass index is 25.17 kg/(m^2).  Physical Exam  Constitutional: He is oriented to person, place, and time. He appears well-developed and well-nourished. No distress.  HENT:  Mouth/Throat: Oropharynx is clear and moist.  Eyes: Pupils are equal, round, and reactive to light. No scleral icterus.  Neck: Neck supple. Carotid bruit is not present. No thyromegaly present.  Cardiovascular: Normal rate and intact distal pulses.  An irregularly irregular rhythm present. Exam reveals no gallop and no friction rub.   Murmur heard.  Systolic murmur is present with a grade of 1/6  no distal LE swelling. No calf TTP  Pulmonary/Chest: Effort normal and breath sounds normal. He has no wheezes. He has no rales. He exhibits no tenderness.    Abdominal: Soft. Bowel sounds are normal. He exhibits no distension, no abdominal bruit, no pulsatile midline mass and no mass. There is tenderness (LLQ TTP but no r/g/r). There is no rebound and no guarding.  Lymphadenopathy:    He has no cervical adenopathy.  Neurological: He is alert and oriented to person,  place, and time.  Resting tremor   Skin: Skin is warm and dry. No rash noted.  Psychiatric: He has a normal mood and affect. His behavior is normal. Thought content normal.     Labs reviewed: Admission on 02/24/2015, Discharged on 02/27/2015  Component Date Value Ref Range Status  . WBC 02/24/2015 8.3  4.0 - 10.5 K/uL Final  . RBC 02/24/2015 3.91* 4.22 - 5.81 MIL/uL Final  . Hemoglobin 02/24/2015 11.1* 13.0 - 17.0 g/dL Final  . HCT 02/24/2015 35.0* 39.0 - 52.0 % Final  . MCV 02/24/2015 89.5  78.0 - 100.0 fL Final  . MCH 02/24/2015 28.4  26.0 - 34.0 pg Final  . MCHC 02/24/2015 31.7  30.0 - 36.0 g/dL Final  . RDW 02/24/2015 15.3  11.5 - 15.5 % Final  . Platelets 02/24/2015 188  150 - 400 K/uL Final  . Neutrophils Relative % 02/24/2015 86* 43 - 77 % Final  . Neutro Abs 02/24/2015 7.2  1.7 - 7.7 K/uL Final  . Lymphocytes Relative 02/24/2015 5* 12 - 46 % Final  . Lymphs Abs 02/24/2015 0.4* 0.7 - 4.0 K/uL Final  . Monocytes Relative 02/24/2015 9  3 - 12 % Final  . Monocytes Absolute 02/24/2015 0.7  0.1 - 1.0 K/uL Final  . Eosinophils Relative 02/24/2015 0  0 - 5 % Final  . Eosinophils Absolute 02/24/2015 0.0  0.0 - 0.7 K/uL Final  . Basophils Relative 02/24/2015 0  0 - 1 % Final  . Basophils Absolute 02/24/2015 0.0  0.0 - 0.1 K/uL Final  . Sodium 02/24/2015 136  135 - 145 mmol/L Final  . Potassium 02/24/2015 4.3  3.5 - 5.1 mmol/L Final  . Chloride 02/24/2015 102  101 - 111 mmol/L Final  . CO2 02/24/2015 26  22 - 32 mmol/L Final  . Glucose, Bld 02/24/2015 113* 65 - 99 mg/dL Final  . BUN 02/24/2015 22* 6 - 20 mg/dL Final  . Creatinine, Ser  02/24/2015 1.20  0.61 - 1.24 mg/dL Final  . Calcium 02/24/2015 8.5* 8.9 - 10.3 mg/dL Final  . Total Protein 02/24/2015 5.7* 6.5 - 8.1 g/dL Final  . Albumin 02/24/2015 3.1* 3.5 - 5.0 g/dL Final  . AST 02/24/2015 25  15 - 41 U/L Final  . ALT 02/24/2015 23  17 - 63 U/L Final  . Alkaline Phosphatase 02/24/2015 69  38 - 126 U/L Final  . Total  Bilirubin 02/24/2015 2.1* 0.3 - 1.2 mg/dL Final  . GFR calc non Af Amer 02/24/2015 57* >60 mL/min Final  . GFR calc Af Amer 02/24/2015 >60  >60 mL/min Final   Comment: (NOTE) The eGFR has been calculated using the CKD EPI equation. This calculation has not been validated in all clinical situations. eGFR's persistently <60 mL/min signify possible Chronic Kidney Disease.   . Anion gap 02/24/2015 8  5 - 15 Final  . Lipase 02/24/2015 28  22 - 51 U/L Final  . Troponin i, poc 02/24/2015 0.01  0.00 - 0.08 ng/mL Final  . Comment 3 02/24/2015          Final   Comment: Due to the release kinetics of cTnI, a negative result within the first hours of the onset of symptoms does not rule out myocardial infarction with certainty. If myocardial infarction is still suspected, repeat the test at appropriate intervals.   . B Natriuretic Peptide 02/24/2015 1936.4* 0.0 - 100.0 pg/mL Final  . Prothrombin Time 02/24/2015 23.8* 11.6 - 15.2 seconds Final  . INR 02/24/2015 2.14* 0.00 - 1.49 Final  . Lactic Acid, Venous 02/24/2015 1.71  0.5 - 2.0 mmol/L Final  . Specimen Description 02/24/2015 BLOOD ARM LEFT   Final  . Special Requests 02/24/2015 BOTTLES DRAWN AEROBIC AND ANAEROBIC 5CC   Final  . Culture 02/24/2015    Final                   Value:NO GROWTH 5 DAYS Performed at [facility]   . Report Status 02/24/2015 03/02/2015 FINAL   Final  . Specimen Description 02/24/2015 BLOOD ARM RIGHT   Final  . Special Requests 02/24/2015 BOTTLES DRAWN AEROBIC ONLY 5CC   Final  . Culture 02/24/2015    Final                   Value:NO GROWTH 5 DAYS Performed at [facility]   . Report Status 02/24/2015 03/02/2015 FINAL   Final  . Color, Urine 02/24/2015 YELLOW  YELLOW Final  . APPearance 02/24/2015 CLEAR  CLEAR Final  . Specific Gravity, Urine 02/24/2015 1.014  1.005 - 1.030 Final  . pH 02/24/2015 6.5  5.0 - 8.0 Final  . Glucose, UA 02/24/2015 NEGATIVE  NEGATIVE mg/dL Final  . Hgb urine  dipstick 02/24/2015 NEGATIVE  NEGATIVE Final  . Bilirubin Urine 02/24/2015 NEGATIVE  NEGATIVE Final  . Ketones, ur 02/24/2015 NEGATIVE  NEGATIVE mg/dL Final  . Protein, ur 02/24/2015 NEGATIVE  NEGATIVE mg/dL Final  . Urobilinogen, UA 02/24/2015 1.0  0.0 - 1.0 mg/dL Final  . Nitrite 02/24/2015 NEGATIVE  NEGATIVE Final  . Leukocytes, UA 02/24/2015 NEGATIVE  NEGATIVE Final   MICROSCOPIC NOT DONE ON URINES WITH NEGATIVE PROTEIN, BLOOD, LEUKOCYTES, NITRITE, OR GLUCOSE <1000 mg/dL.  . Troponin I 02/24/2015 0.04* <0.031 ng/mL Final   Comment:        PERSISTENTLY INCREASED TROPONIN VALUES IN THE RANGE OF 0.04-0.49 ng/mL CAN BE SEEN IN:       -UNSTABLE ANGINA       -  CONGESTIVE HEART FAILURE       -MYOCARDITIS       -CHEST TRAUMA       -ARRYHTHMIAS       -LATE PRESENTING MYOCARDIAL INFARCTION       -COPD   CLINICAL FOLLOW-UP RECOMMENDED.   Marland Kitchen Troponin I 02/25/2015 0.04* <0.031 ng/mL Final   Comment:        PERSISTENTLY INCREASED TROPONIN VALUES IN THE RANGE OF 0.04-0.49 ng/mL CAN BE SEEN IN:       -UNSTABLE ANGINA       -CONGESTIVE HEART FAILURE       -MYOCARDITIS       -CHEST TRAUMA       -ARRYHTHMIAS       -LATE PRESENTING MYOCARDIAL INFARCTION       -COPD   CLINICAL FOLLOW-UP RECOMMENDED.   Marland Kitchen Sodium 02/25/2015 134* 135 - 145 mmol/L Final  . Potassium 02/25/2015 4.2  3.5 - 5.1 mmol/L Final  . Chloride 02/25/2015 102  101 - 111 mmol/L Final  . CO2 02/25/2015 24  22 - 32 mmol/L Final  . Glucose, Bld 02/25/2015 106* 65 - 99 mg/dL Final  . BUN 02/25/2015 22* 6 - 20 mg/dL Final  . Creatinine, Ser 02/25/2015 1.29* 0.61 - 1.24 mg/dL Final  . Calcium 02/25/2015 8.8* 8.9 - 10.3 mg/dL Final  . GFR calc non Af Amer 02/25/2015 52* >60 mL/min Final  . GFR calc Af Amer 02/25/2015 >60  >60 mL/min Final   Comment: (NOTE) The eGFR has been calculated using the CKD EPI equation. This calculation has not been validated in all clinical situations. eGFR's persistently <60 mL/min signify possible  Chronic Kidney Disease.   . Anion gap 02/25/2015 8  5 - 15 Final  . Prothrombin Time 02/25/2015 21.5* 11.6 - 15.2 seconds Final  . INR 02/25/2015 1.88* 0.00 - 1.49 Final  . Troponin I 02/25/2015 0.05* <0.031 ng/mL Final   Comment:        PERSISTENTLY INCREASED TROPONIN VALUES IN THE RANGE OF 0.04-0.49 ng/mL CAN BE SEEN IN:       -UNSTABLE ANGINA       -CONGESTIVE HEART FAILURE       -MYOCARDITIS       -CHEST TRAUMA       -ARRYHTHMIAS       -LATE PRESENTING MYOCARDIAL INFARCTION       -COPD   CLINICAL FOLLOW-UP RECOMMENDED.   . C difficile by pcr 02/25/2015 NEGATIVE  NEGATIVE Final  . Sodium 02/26/2015 134* 135 - 145 mmol/L Final  . Potassium 02/26/2015 4.3  3.5 - 5.1 mmol/L Final  . Chloride 02/26/2015 102  101 - 111 mmol/L Final  . CO2 02/26/2015 23  22 - 32 mmol/L Final  . Glucose, Bld 02/26/2015 95  65 - 99 mg/dL Final  . BUN 02/26/2015 22* 6 - 20 mg/dL Final  . Creatinine, Ser 02/26/2015 1.27* 0.61 - 1.24 mg/dL Final  . Calcium 02/26/2015 8.3* 8.9 - 10.3 mg/dL Final  . GFR calc non Af Amer 02/26/2015 53* >60 mL/min Final  . GFR calc Af Amer 02/26/2015 >60  >60 mL/min Final   Comment: (NOTE) The eGFR has been calculated using the CKD EPI equation. This calculation has not been validated in all clinical situations. eGFR's persistently <60 mL/min signify possible Chronic Kidney Disease.   . Anion gap 02/26/2015 9  5 - 15 Final  . Prothrombin Time 02/26/2015 25.3* 11.6 - 15.2 seconds Final  . INR 02/26/2015 2.33* 0.00 - 1.49 Final  .  Sodium 02/27/2015 135  135 - 145 mmol/L Final  . Potassium 02/27/2015 4.4  3.5 - 5.1 mmol/L Final  . Chloride 02/27/2015 104  101 - 111 mmol/L Final  . CO2 02/27/2015 22  22 - 32 mmol/L Final  . Glucose, Bld 02/27/2015 112* 65 - 99 mg/dL Final  . BUN 02/27/2015 20  6 - 20 mg/dL Final  . Creatinine, Ser 02/27/2015 1.27* 0.61 - 1.24 mg/dL Final  . Calcium 02/27/2015 8.2* 8.9 - 10.3 mg/dL Final  . GFR calc non Af Amer 02/27/2015 53* >60  mL/min Final  . GFR calc Af Amer 02/27/2015 >60  >60 mL/min Final   Comment: (NOTE) The eGFR has been calculated using the CKD EPI equation. This calculation has not been validated in all clinical situations. eGFR's persistently <60 mL/min signify possible Chronic Kidney Disease.   . Anion gap 02/27/2015 9  5 - 15 Final  . Prothrombin Time 02/27/2015 24.8* 11.6 - 15.2 seconds Final  . INR 02/27/2015 2.26* 0.00 - 1.49 Final  Admission on 02/20/2015, Discharged on 02/23/2015  Component Date Value Ref Range Status  . WBC 02/20/2015 9.0  4.0 - 10.5 K/uL Final  . RBC 02/20/2015 4.03* 4.22 - 5.81 MIL/uL Final  . Hemoglobin 02/20/2015 11.7* 13.0 - 17.0 g/dL Final  . HCT 02/20/2015 37.8* 39.0 - 52.0 % Final  . MCV 02/20/2015 93.8  78.0 - 100.0 fL Final  . MCH 02/20/2015 29.0  26.0 - 34.0 pg Final  . MCHC 02/20/2015 31.0  30.0 - 36.0 g/dL Final  . RDW 02/20/2015 15.2  11.5 - 15.5 % Final  . Platelets 02/20/2015 341  150 - 400 K/uL Final  . Neutrophils Relative % 02/20/2015 74  43 - 77 % Final  . Neutro Abs 02/20/2015 6.7  1.7 - 7.7 K/uL Final  . Lymphocytes Relative 02/20/2015 11* 12 - 46 % Final  . Lymphs Abs 02/20/2015 1.0  0.7 - 4.0 K/uL Final  . Monocytes Relative 02/20/2015 14* 3 - 12 % Final  . Monocytes Absolute 02/20/2015 1.3* 0.1 - 1.0 K/uL Final  . Eosinophils Relative 02/20/2015 1  0 - 5 % Final  . Eosinophils Absolute 02/20/2015 0.1  0.0 - 0.7 K/uL Final  . Basophils Relative 02/20/2015 0  0 - 1 % Final  . Basophils Absolute 02/20/2015 0.0  0.0 - 0.1 K/uL Final  . Sodium 02/20/2015 138  135 - 145 mmol/L Final  . Potassium 02/20/2015 4.5  3.5 - 5.1 mmol/L Final  . Chloride 02/20/2015 103  101 - 111 mmol/L Final  . CO2 02/20/2015 26  22 - 32 mmol/L Final  . Glucose, Bld 02/20/2015 161* 65 - 99 mg/dL Final  . BUN 02/20/2015 45* 6 - 20 mg/dL Final  . Creatinine, Ser 02/20/2015 1.53* 0.61 - 1.24 mg/dL Final  . Calcium 02/20/2015 8.8* 8.9 - 10.3 mg/dL Final  . Total Protein  02/20/2015 6.4* 6.5 - 8.1 g/dL Final  . Albumin 02/20/2015 3.4* 3.5 - 5.0 g/dL Final  . AST 02/20/2015 31  15 - 41 U/L Final  . ALT 02/20/2015 66* 17 - 63 U/L Final  . Alkaline Phosphatase 02/20/2015 97  38 - 126 U/L Final  . Total Bilirubin 02/20/2015 1.7* 0.3 - 1.2 mg/dL Final  . GFR calc non Af Amer 02/20/2015 42* >60 mL/min Final  . GFR calc Af Amer 02/20/2015 49* >60 mL/min Final   Comment: (NOTE) The eGFR has been calculated using the CKD EPI equation. This calculation has not been validated in all  clinical situations. eGFR's persistently <60 mL/min signify possible Chronic Kidney Disease.   . Anion gap 02/20/2015 9  5 - 15 Final  . Prothrombin Time 02/20/2015 25.5* 11.6 - 15.2 seconds Final  . INR 02/20/2015 2.35* 0.00 - 1.49 Final  . Troponin I 02/20/2015 0.03  <0.031 ng/mL Final   Comment:        NO INDICATION OF MYOCARDIAL INJURY.   . Color, Urine 02/20/2015 YELLOW  YELLOW Final  . APPearance 02/20/2015 CLEAR  CLEAR Final  . Specific Gravity, Urine 02/20/2015 >1.030* 1.005 - 1.030 Final  . pH 02/20/2015 5.5  5.0 - 8.0 Final  . Glucose, UA 02/20/2015 NEGATIVE  NEGATIVE mg/dL Final  . Hgb urine dipstick 02/20/2015 SMALL* NEGATIVE Final  . Bilirubin Urine 02/20/2015 NEGATIVE  NEGATIVE Final  . Ketones, ur 02/20/2015 NEGATIVE  NEGATIVE mg/dL Final  . Protein, ur 02/20/2015 NEGATIVE  NEGATIVE mg/dL Final  . Urobilinogen, UA 02/20/2015 0.2  0.0 - 1.0 mg/dL Final  . Nitrite 02/20/2015 NEGATIVE  NEGATIVE Final  . Leukocytes, UA 02/20/2015 NEGATIVE  NEGATIVE Final  . Lactic Acid, Venous 02/20/2015 3.22* 0.5 - 2.0 mmol/L Final  . Fecal Occult Bld 02/20/2015 NEGATIVE  NEGATIVE Final  . Squamous Epithelial / LPF 02/20/2015 RARE  RARE Final  . WBC, UA 02/20/2015 0-2  <3 WBC/hpf Final  . RBC / HPF 02/20/2015 3-6  <3 RBC/hpf Final  . Bacteria, UA 02/20/2015 FEW* RARE Final  . Glucose-Capillary 02/20/2015 118* 65 - 99 mg/dL Final  . Troponin I 02/21/2015 0.05* <0.031 ng/mL Final    Comment:        PERSISTENTLY INCREASED TROPONIN VALUES IN THE RANGE OF 0.04-0.49 ng/mL CAN BE SEEN IN:       -UNSTABLE ANGINA       -CONGESTIVE HEART FAILURE       -MYOCARDITIS       -CHEST TRAUMA       -ARRYHTHMIAS       -LATE PRESENTING MYOCARDIAL INFARCTION       -COPD   CLINICAL FOLLOW-UP RECOMMENDED.   Marland Kitchen Glucose-Capillary 02/20/2015 136* 65 - 99 mg/dL Final  . C difficile by pcr 02/21/2015 POSITIVE* NEGATIVE Final   Comment: CRITICAL RESULT CALLED TO, READ BACK BY AND VERIFIED WITH: T. FAIRING RN 11:40 02/21/15 ([email or web address])   . Glucose-Capillary 02/21/2015 170* 65 - 99 mg/dL Final  . Magnesium 02/21/2015 2.4  1.7 - 2.4 mg/dL Final  . Glucose-Capillary 02/21/2015 137* 65 - 99 mg/dL Final  . Comment 1 02/21/2015 Notify RN   Final  . Glucose-Capillary 02/21/2015 141* 65 - 99 mg/dL Final  . WBC 02/22/2015 9.2  4.0 - 10.5 K/uL Final  . RBC 02/22/2015 3.87* 4.22 - 5.81 MIL/uL Final  . Hemoglobin 02/22/2015 10.8* 13.0 - 17.0 g/dL Final  . HCT 02/22/2015 35.1* 39.0 - 52.0 % Final  . MCV 02/22/2015 90.7  78.0 - 100.0 fL Final  . MCH 02/22/2015 27.9  26.0 - 34.0 pg Final  . MCHC 02/22/2015 30.8  30.0 - 36.0 g/dL Final  . RDW 02/22/2015 15.2  11.5 - 15.5 % Final  . Platelets 02/22/2015 240  150 - 400 K/uL Final  . Sodium 02/22/2015 135  135 - 145 mmol/L Final  . Potassium 02/22/2015 4.4  3.5 - 5.1 mmol/L Final  . Chloride 02/22/2015 101  101 - 111 mmol/L Final  . CO2 02/22/2015 24  22 - 32 mmol/L Final  . Glucose, Bld 02/22/2015 151* 65 - 99 mg/dL Final  . BUN 02/22/2015 31* 6 -  20 mg/dL Final  . Creatinine, Ser 02/22/2015 1.32* 0.61 - 1.24 mg/dL Final  . Calcium 02/22/2015 8.3* 8.9 - 10.3 mg/dL Final  . Total Protein 02/22/2015 5.5* 6.5 - 8.1 g/dL Final  . Albumin 02/22/2015 2.8* 3.5 - 5.0 g/dL Final  . AST 02/22/2015 20  15 - 41 U/L Final  . ALT 02/22/2015 39  17 - 63 U/L Final  . Alkaline Phosphatase 02/22/2015 76  38 - 126 U/L Final  . Total Bilirubin 02/22/2015 1.8* 0.3 - 1.2  mg/dL Final  . GFR calc non Af Amer 02/22/2015 50* >60 mL/min Final  . GFR calc Af Amer 02/22/2015 58* >60 mL/min Final   Comment: (NOTE) The eGFR has been calculated using the CKD EPI equation. This calculation has not been validated in all clinical situations. eGFR's persistently <60 mL/min signify possible Chronic Kidney Disease.   . Anion gap 02/22/2015 10  5 - 15 Final  . Prothrombin Time 02/22/2015 32.2* 11.6 - 15.2 seconds Final  . INR 02/22/2015 3.21* 0.00 - 1.49 Final  . Glucose-Capillary 02/22/2015 113* 65 - 99 mg/dL Final  . Comment 1 02/22/2015 Notify RN   Final  . Comment 2 02/22/2015 Document in Chart   Final  . Glucose-Capillary 02/22/2015 113* 65 - 99 mg/dL Final  . Comment 1 02/22/2015 Notify RN   Final  . Comment 2 02/22/2015 Document in Chart   Final  . Sodium 02/23/2015 137  135 - 145 mmol/L Final  . Potassium 02/23/2015 4.7  3.5 - 5.1 mmol/L Final  . Chloride 02/23/2015 103  101 - 111 mmol/L Final  . CO2 02/23/2015 28  22 - 32 mmol/L Final  . Glucose, Bld 02/23/2015 104* 65 - 99 mg/dL Final  . BUN 02/23/2015 25* 6 - 20 mg/dL Final  . Creatinine, Ser 02/23/2015 1.14  0.61 - 1.24 mg/dL Final  . Calcium 02/23/2015 8.4* 8.9 - 10.3 mg/dL Final  . GFR calc non Af Amer 02/23/2015 >60  >60 mL/min Final  . GFR calc Af Amer 02/23/2015 >60  >60 mL/min Final   Comment: (NOTE) The eGFR has been calculated using the CKD EPI equation. This calculation has not been validated in all clinical situations. eGFR's persistently <60 mL/min signify possible Chronic Kidney Disease.   . Anion gap 02/23/2015 6  5 - 15 Final  . Prothrombin Time 02/23/2015 31.3* 11.6 - 15.2 seconds Final  . INR 02/23/2015 3.09* 0.00 - 1.49 Final  . WBC 02/23/2015 7.4  4.0 - 10.5 K/uL Final  . RBC 02/23/2015 3.64* 4.22 - 5.81 MIL/uL Final  . Hemoglobin 02/23/2015 10.8* 13.0 - 17.0 g/dL Final  . HCT 02/23/2015 33.4* 39.0 - 52.0 % Final  . MCV 02/23/2015 91.8  78.0 - 100.0 fL Final  . MCH 02/23/2015  29.7  26.0 - 34.0 pg Final  . MCHC 02/23/2015 32.3  30.0 - 36.0 g/dL Final  . RDW 02/23/2015 15.3  11.5 - 15.5 % Final  . Platelets 02/23/2015 192  150 - 400 K/uL Final  . Glucose-Capillary 02/22/2015 129* 65 - 99 mg/dL Final  . Glucose-Capillary 02/21/2015 126* 65 - 99 mg/dL Final  Admission on 02/14/2015, Discharged on 02/19/2015  No results displayed because visit has over 200 results.      Ct Abdomen Pelvis Wo Contrast  02/20/2015   CLINICAL DATA:  Persistent diarrhea.  Bradycardia.  EXAM: CT ABDOMEN AND PELVIS WITHOUT CONTRAST  TECHNIQUE: Multidetector CT imaging of the abdomen and pelvis was performed following the standard protocol without IV contrast.  COMPARISON:  08/09/2011 report, images not available  FINDINGS: Coronary artery calcifications with post CABG changes. Bilateral pleural effusions with compressive atelectasis. Pleural effusions are small to moderate in size. Negative for free air.  Unenhanced CT was performed per clinician order. Lack of IV contrast limits sensitivity and specificity, especially for evaluation of abdominal/pelvic solid viscera.  There is a small amount of perihepatic and perisplenic ascites. The gallbladder is mildly distended with high-density material suggestive for sludge and cannot exclude gallstones. No gross abnormality to the spleen or pancreas. Normal appearance of the adrenal glands and kidneys. Negative for kidney stones or hydronephrosis. There is a small amount of fluid or edema around the duodenum. No significant lymphadenopathy. There is edema or stranding in the lower abdomen around the iliac vessels, particularly on the left side. This appears to be extraperitoneal fluid. There is diffuse subcutaneous edema. There is edema in the presacral region. No gross abnormality to the prostate or urinary bladder.  No gross abnormality to the small or large bowel. A normal appearing appendix.  Disc space narrowing and disease at L4-L5. No acute bone  abnormality.  IMPRESSION: Bilateral pleural effusions with a small amount of intra-abdominal fluid and extensive subcutaneous edema. Findings are suggestive for anasarca.  The gallbladder is distended with high-density material that could represent sludge. Gallbladder could be further characterized with ultrasound.   Electronically Signed   By: Markus Daft M.D.   On: 02/20/2015 15:32   Ct Head Wo Contrast  02/20/2015   CLINICAL DATA:  LOC, diarrhea  EXAM: CT HEAD WITHOUT CONTRAST  TECHNIQUE: Contiguous axial images were obtained from the base of the skull through the vertex without intravenous contrast.  COMPARISON:  12/04/2009  FINDINGS: No skull fracture is noted. Paranasal sinuses and mastoid air cells are unremarkable. Atherosclerotic calcifications of carotid siphon again noted. Stable atrophy and chronic white matter disease. No acute cortical infarction. No mass lesion is noted on this unenhanced scan. Ventricular size is stable from prior exam.  IMPRESSION: No acute intracranial abnormality. Stable atrophy and chronic white matter disease. No acute cortical infarction.   Electronically Signed   By: Lahoma Crocker M.D.   On: 02/20/2015 15:32   Dg Chest Port 1 View  02/24/2015   CLINICAL DATA:  Weakness, left lower quadrant pain  EXAM: PORTABLE CHEST - 1 VIEW  COMPARISON:  02/22/2015  FINDINGS: Cardiomegaly again noted. Status post median sternotomy. No acute infiltrate or pleural effusion. No pulmonary edema.  IMPRESSION: No active disease.   Electronically Signed   By: Lahoma Crocker M.D.   On: 02/24/2015 12:16   Dg Chest Port 1 View  02/22/2015   CLINICAL DATA:  80 year old male status post placement of cardiac device  EXAM: PORTABLE CHEST - 1 VIEW  COMPARISON:  Preoperative chest x-ray 02/20/2015  FINDINGS: Interval placement of a right subclavian approach single lead cardiac rhythm maintenance device. The lead projects over the right ventricle. Cardiomegaly with left heart enlargement. Patient is status  post median sternotomy with evidence of prior multivessel CABG including LIMA bypass. Slightly increased pulmonary vascular congestion without overt edema. No evidence of pneumothorax or large pleural effusion. Inspiratory volumes are slightly low with mild bibasilar atelectasis. No acute osseous abnormality.  IMPRESSION: 1. New right subclavian approach single lead cardiac rhythm maintenance device. The lead projects over the right ventricle. 2. No evidence of pneumothorax. 3. Slightly increased pulmonary vascular congestion without overt edema. 4. Lower inspiratory volumes with bibasilar atelectasis.   Electronically Signed   By: Myrle Sheng  Laurence Ferrari M.D.   On: 02/22/2015 12:13   Dg Chest Portable 1 View  02/20/2015   CLINICAL DATA:  Persistent diarrhea, bradycardia, history diabetes mellitus, hypertension, paroxysmal atrial fibrillation, coronary artery disease post MI and and CABG, ischemic cardiomyopathy  EXAM: PORTABLE CHEST - 1 VIEW  COMPARISON:  Portable exam 1329 hours compared to 02/16/2015  FINDINGS: Enlargement of cardiac silhouette post CABG.  Slight pulmonary vascular congestion.  Bibasilar atelectasis.  Lungs otherwise clear.  No pleural effusion or pneumothorax.  IMPRESSION: Enlargement of cardiac silhouette with pulmonary vascular congestion post CABG.  Mild bibasilar atelectasis.   Electronically Signed   By: Lavonia Dana M.D.   On: 02/20/2015 14:00   Dg Chest Port 1 View  02/16/2015   CLINICAL DATA:  Shortness of breath, CHF.  EXAM: PORTABLE CHEST - 1 VIEW  COMPARISON:  02/09/2015  FINDINGS: Cardiomegaly and CABG changes again noted.  Pulmonary vascular congestion with possible mild interstitial edema again noted.  There is no evidence of pneumothorax.  Mild bilateral lower lung atelectasis/airspace disease again noted.  IMPRESSION: Little significant change. Bilateral lower lung atelectasis/ airspace disease. Pneumonia not excluded.  Cardiomegaly with pulmonary vascular congestion and possible  mild interstitial edema.   Electronically Signed   By: Margarette Canada M.D.   On: 02/16/2015 16:28   Dg Abd Portable 1v  02/24/2015   CLINICAL DATA:  Lower abdominal pain and weakness  EXAM: PORTABLE ABDOMEN - 1 VIEW  COMPARISON:  None.  FINDINGS: Bowel gas pattern unremarkable. No obstruction or free air is seen on this supine examination. There is contrast in the rectum. There are vascular calcifications in the pelvis.  IMPRESSION: Bowel gas pattern unremarkable.   Electronically Signed   By: Lowella Grip III M.D.   On: 02/24/2015 12:18     Assessment/Plan   ICD-9-CM ICD-10-CM   1. C. difficile colitis - improving 008.45 A04.7   2. Ischemic cardiomyopathy - stable 414.8 I25.5   3. Chronic systolic CHF (congestive heart failure) (HCC) - stable 428.22 I50.22    428.0    4. Paroxysmal atrial fibrillation (HCC) - rate controlled 427.31 I48.0   5. 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 Type 2 diabetes mellitus with stage 3 chronic kidney disease, without long-term current use of insulin (HCC) - diet controlled 250.40 E11.22    585.3 N18.3     --check CBC w diff, BMP and INR tomorrow. Check LFTs and TSH in 2 weeks  --f/u with cardio in 1-2 mos  --continue current meds as ordered  --PT/OT/ST as indicated  --cont nutritional supplement as ordered  --GOAL: short term rehab and d/c home when medically appropriate. Communicated with pt and nursing.  Will follow  Monie Shere S. Perlie Gold  [facility] and [facility] [address] [place], [place] [address] [phone] Cell (Monday-Friday 8 AM - 5 PM) [phone] After 5 PM and follow prompts

## 2015-03-11 ENCOUNTER — Other Ambulatory Visit: Payer: Self-pay | Admitting: Licensed Clinical Social Worker

## 2015-03-11 NOTE — Patient Outreach (Signed)
Assessment:  CSW Lorna Few received referral on [facility].  CSW completed chart review on client on 03/11/15.  Client was recently discharged from the hospital and admitted to [facility] in [place].  Client is hoping to remain at [facility] facility temporarily until he has completed rehabilitation needed and is able to return home with his spouse.  He has completed [facility] consent form recently. He has numerous medical issues related to his heart and did have a pacemaker implant recently. CSW called Symon Strohmeyer, spouse and emergency contact for client, on 03/11/15 to discuss current needs of client. CSW was not able to speak via phone with [name] nor was CSW able to leave phone message for [place].  CSW will attempt to call [name] at a later time to discuss needs of client.  CSW called [facility] in [place] on 03/11/15 to speak with client.  CSW left phone message for client on 03/11/15 requesting client return call to CSW at [phone] to discuss needs of client.  Client returned call to CSW on 03/11/15. CSW verified identity of client.   Client said he was participating in physical therapy services at facility.  CSW and client agreed for CSW to conduct facility visit with client on 03/12/15 at 10:00 AM.  Client said he hoped to receive physical therapy services as needed and hoped to return home soon.   Plan:   CSW to conduct facility visit with client on 03/12/15 at 10:00 AM. Client to cooperate with care providers at [facility] in [place]. Client to attend scheduled medical appointments for client.  Kelton Pillar.Adelee Hannula MSW, LCSW Licensed Clinical Social Worker [facility] Care Management [phone]

## 2015-03-12 ENCOUNTER — Other Ambulatory Visit: Payer: Self-pay | Admitting: Licensed Clinical Social Worker

## 2015-03-12 NOTE — Patient Outreach (Signed)
[facility]) Care Management  [facility] Social Work  03/12/2015  [name] 05-28-1935 [id number]  Subjective:    Objective:   Current Medications:  Current Outpatient Prescriptions  Medication Sig Dispense Refill  . ALPRAZolam (XANAX) 0.5 MG tablet Take 1 tablet (0.5 mg total) by mouth 3 (three) times daily as needed for anxiety. 9 tablet 0  . amiodarone (PACERONE) 200 MG tablet Take 0.5 tablets (100 mg total) by mouth daily. 15 tablet 6  . atorvastatin (LIPITOR) 20 MG tablet Take 1 tablet (20 mg total) by mouth daily at 6 PM.    . carvedilol (COREG) 3.125 MG tablet Take 1 tablet (3.125 mg total) by mouth 2 (two) times daily with a meal.    . finasteride (PROSCAR) 5 MG tablet Take 5 mg by mouth daily.     . furosemide (LASIX) 40 MG tablet Take 1 tablet (40 mg total) by mouth daily. 30 tablet   . lactose free nutrition (BOOST) LIQD Take 237 mLs by mouth 3 (three) times daily between meals.    Marland Kitchen levothyroxine (SYNTHROID, LEVOTHROID) 125 MCG tablet Take 125 mcg by mouth daily.    Marland Kitchen losartan (COZAAR) 50 MG tablet Take 0.5 tablets (25 mg total) by mouth daily. 30 tablet 6  . saccharomyces boulardii (FLORASTOR) 250 MG capsule Take 1 capsule (250 mg total) by mouth 2 (two) times daily.    . vancomycin (VANCOCIN) 50 mg/mL oral solution Take 2.5 mLs (125 mg total) by mouth 4 (four) times daily. Take for 12 days then stop. 120 mL 0  . warfarin (COUMADIN) 1 MG tablet Take 1 tablet (1 mg total) by mouth daily. Take until seen in coumadin clinic. 30 tablet 0   No current facility-administered medications for this visit.    Functional Status:  In your present state of health, do you have any difficulty performing the following activities: 03/12/2015 02/24/2015  Hearing? N N  Vision? Y Y  Difficulty concentrating or making decisions? Y N  Walking or climbing stairs? Y Y  Dressing or bathing? Y Y  Doing errands, shopping? Y N  Preparing Food and eating ? Y -  Using the Toilet? Y  -  In the past six months, have you accidently leaked urine? N -  Do you have problems with loss of bowel control? N -  Managing your Medications? Y -  Managing your Finances? Y -  Housekeeping or managing your Housekeeping? Y -    Fall/Depression Screening:  PHQ 2/9 Scores 03/12/2015  PHQ - 2 Score 2  PHQ- 9 Score 6    Assessment:   CSW met with client at facility on 03/12/15.  Patient assessed in  [place] for continued care needs. CSW will continue to collaborate with the skilled nursing facility social worker to facilitate discharge planning needs and communicate with the patient and family.  Client was in angry mood, very upset about current status.  CSW informed client that primary doctor for client referred client to [facility] program support.  Client has completed [facility] consent form.  Client said he has not had a nurse to provide in home support for him.  He said he has not had physical therapy support in the home. He said when he and wife are at home they eat out a good deal of the time.  He said his wife had recently broken her hip and is staying with their daughter for support for wife at present.  He said his daugther lived in  [place].  He said his wife is currently using a wheelchair to ambulate.  CSW and client spoke of client's rights and responsibilities.  CSW reviewed verbally with client the patients rights and responsibilities areas.  Client said he wants to do his physical therapy exercises and get strong enough to go home.  He said at his home in the community a neighbor often would check in on him from time to time.  He has a son who lives in [place], [place].  He said his son travels with his job.  He said he felt that his medicines were working well.  He said he felt as if he had a decreased appetite.  He said he has a wound area on his back that is being cared for at present at facility.  He said wound area does cause pain to him occasionally.  He said he had not been  in a nursing care facility previously. CSW and client completed needed [facility] assessments for client.  CSW gave client the [place] card and encouraged client to call CSW as needed to discuss CSW needs of client.  CSW thanked client for meeting with CSW at facility on 03/12/15.   CSW informed RN [name]) at facility on 03/12/15 that client was concerned about room temperature issues and about wound care process/treatment for client.  [name] (RN) wrote down client concerns on 03/12/15; she said she would look into these areas of concern for client.  CSW thanked [name], Therapist, sports, for her assistance. CSW also gave RN, [name], [facility] CSW card if she should need to call CSW related to clinical social work needs of client.   Plan:  Client to cooperate with care providers at facility. Client to participate in physical therapy sessions, as scheduled for client, at facility. Client and family representative to communicate with facility social worker, as needed,  to establish discharge plans for client CSW to call client/spouse of client in two weeks to assess status of client at that time.   [name].[name] MSW, LCSW Licensed Clinical Social Worker [facility] Care Management [phone]

## 2015-03-14 ENCOUNTER — Other Ambulatory Visit: Payer: Self-pay | Admitting: *Deleted

## 2015-03-14 NOTE — Patient Outreach (Signed)
03/14/15- collaborated with [facility] CSW Scott Forrest on plan of care,  Pt is still at [facility] and does plan to come home, hopefully in next few weeks,  CSW will keep RN CM updated on plan of care.

## 2015-03-25 ENCOUNTER — Non-Acute Institutional Stay (SKILLED_NURSING_FACILITY): Payer: Medicare Other | Admitting: Internal Medicine

## 2015-03-25 ENCOUNTER — Encounter: Payer: Self-pay | Admitting: Internal Medicine

## 2015-03-25 DIAGNOSIS — E038 Other specified hypothyroidism: Secondary | ICD-10-CM | POA: Diagnosis not present

## 2015-03-25 DIAGNOSIS — R531 Weakness: Secondary | ICD-10-CM

## 2015-03-25 DIAGNOSIS — E785 Hyperlipidemia, unspecified: Secondary | ICD-10-CM

## 2015-03-25 DIAGNOSIS — I480 Paroxysmal atrial fibrillation: Secondary | ICD-10-CM | POA: Diagnosis not present

## 2015-03-25 DIAGNOSIS — E1122 Type 2 diabetes mellitus with diabetic chronic kidney disease: Secondary | ICD-10-CM

## 2015-03-25 DIAGNOSIS — Z950 Presence of cardiac pacemaker: Secondary | ICD-10-CM | POA: Diagnosis not present

## 2015-03-25 DIAGNOSIS — I255 Ischemic cardiomyopathy: Secondary | ICD-10-CM

## 2015-03-25 DIAGNOSIS — Z7901 Long term (current) use of anticoagulants: Secondary | ICD-10-CM

## 2015-03-25 DIAGNOSIS — E034 Atrophy of thyroid (acquired): Secondary | ICD-10-CM

## 2015-03-25 DIAGNOSIS — I5022 Chronic systolic (congestive) heart failure: Secondary | ICD-10-CM

## 2015-03-25 DIAGNOSIS — N183 Chronic kidney disease, stage 3 (moderate): Secondary | ICD-10-CM

## 2015-03-25 NOTE — Progress Notes (Signed)
Patient ID: [name], male   DOB: 12/30/1934, 80 y.o.   MRN: [id number]    DATE: 03/25/15  Location:  [place] of Service: SNF [phone])   Extended Emergency Contact Information Primary Emergency Contact: Kozlowski,Betty R Address: [address]          [place], [place] [address] Johnnette Litter of [place] Phone: [phone] Relation: Spouse Secondary Emergency Contact: Mardelle Matte, [place] [address] Johnnette Litter of [place] Phone: [phone] Mobile Phone: [phone] Relation: Daughter  Advanced Directive information   FULL CODE  Chief Complaint  Patient presents with  . Discharge Note    HPI:  80 yo male seen today for d/c from SNF following short term rehab for deconditioning, cardiac arrythmia s/p pacer, ICM. He is on coumadin for afib. He will be dc'd home with [facility] RN, PT/ST. He is a on a mechanical soft diet.   Past Medical History  Diagnosis Date  . Ischemic cardiomyopathy     a. prior CRTD implantation with subcutaneous array b. s/p device system extraction 2/2 infection  . VT (ventricular tachycardia)     a. s/p ablation  . Anxiety   . Depression   . Permanent atrial fibrillation   . Hyperlipidemia   . Gynecomastia   . BPH (benign prostatic hypertrophy)   . Diabetes mellitus   . Hypertension   . Degenerative joint disease   . GERD (gastroesophageal reflux disease)   . CAD (coronary artery disease)     a. s/p CABG    Past Surgical History  Procedure Laterality Date  . Cardiac defibrillator removal    . Coronary artery bypass graft  2000    X5  . Cardiac catheterization  12/25/2008    THE LEFT VENTRICLE WAS ENLARGED. THERE IS ANTERIOR APICAL AND INFERIOR APICAL AKINESIA. EF ESTIMATED 20%. NO MITRAL REGURGITATION.  . Cardiac catheterization  12/2008    REPEAT CATH, SHOWED BYPASS GRAFTS WERE PATENT  . US echocardiography  2007    EF 20 to 25%  . Ablation of dysrhythmic focus    . Pars plana vitrectomy   09/24/2011    Procedure: PARS PLANA VITRECTOMY WITH 25 GAUGE;  Surgeon: Clent Demark Rankin;  Location: [facility] OR;  Service: Ophthalmology;  Laterality: Left;  MEMBRANE PEEL, SILICONE OIL  . Cardioversion N/A 11/20/2012    Procedure: CARDIOVERSION;  Surgeon: Fay Records, MD;  Location: [facility];  Service: Cardiovascular;  Laterality: N/A;  . Cardioversion N/A 12/28/2012    Procedure: CARDIOVERSION;  Surgeon: Darlin Coco, MD;  Location: [facility] ENDOSCOPY;  Service: Cardiovascular;  Laterality: N/A;  . Cataract extraction, bilateral    . Ep implantable device N/A 02/21/2015    Procedure: Pacemaker Implant;  Surgeon: Thompson Grayer, MD;  Location: [facility] LAB;  Service: Cardiovascular;  Laterality: N/A;    Patient Care Team: Crist Infante, MD as PCP - General (Internal Medicine) Kassie Mends, RN as [place], LCSW as [facility] Management (Licensed Clinical Social Worker)  History   Social History  . Marital Status: Married    Spouse Name: N/A  . Number of Children: N/A  . Years of Education: N/A   Occupational History  . retired     You[employer] worker   Social History Main Topics  . Smoking status: Never Smoker   . Smokeless tobacco: Never Used  . Alcohol Use: No  Comment: hx of in younger years, not now  . Drug Use: No  . Sexual Activity: Not Currently   Other Topics Concern  . Not on file   Social History Narrative     reports that he has never smoked. He has never used smokeless tobacco. He reports that he does not drink alcohol or use illicit drugs.   There is no immunization history on file for this patient.  No Known Allergies  Medications: Patient's Medications  New Prescriptions   No medications on file  Previous Medications   ALPRAZOLAM (XANAX) 0.5 MG TABLET    Take 1 tablet (0.5 mg total) by mouth 3 (three) times daily as needed for anxiety.   AMIODARONE (PACERONE) 200 MG TABLET    Take 0.5  tablets (100 mg total) by mouth daily.   ATORVASTATIN (LIPITOR) 20 MG TABLET    Take 1 tablet (20 mg total) by mouth daily at 6 PM.   CARVEDILOL (COREG) 3.125 MG TABLET    Take 1 tablet (3.125 mg total) by mouth 2 (two) times daily with a meal.   FINASTERIDE (PROSCAR) 5 MG TABLET    Take 5 mg by mouth daily.    FUROSEMIDE (LASIX) 40 MG TABLET    Take 1 tablet (40 mg total) by mouth daily.   LACTOSE FREE NUTRITION (BOOST) LIQD    Take 237 mLs by mouth 3 (three) times daily between meals.   LEVOTHYROXINE (SYNTHROID, LEVOTHROID) 125 MCG TABLET    Take 125 mcg by mouth daily.   LOSARTAN (COZAAR) 50 MG TABLET    Take 0.5 tablets (25 mg total) by mouth daily.   SACCHAROMYCES BOULARDII (FLORASTOR) 250 MG CAPSULE    Take 1 capsule (250 mg total) by mouth 2 (two) times daily.   VANCOMYCIN (VANCOCIN) 50 MG/ML ORAL SOLUTION    Take 2.5 mLs (125 mg total) by mouth 4 (four) times daily. Take for 12 days then stop.   WARFARIN (COUMADIN) 1 MG TABLET    Take 1 tablet (1 mg total) by mouth daily. Take until seen in coumadin clinic.  Modified Medications   No medications on file  Discontinued Medications   No medications on file    Review of Systems  Unable to perform ROS: Other    Filed Vitals:   03/25/15 1820  BP: 104/67  Pulse: 62  Temp: 97.7 F (36.5 C)   There is no weight on file to calculate BMI.  Physical Exam  Constitutional: He appears well-developed. No distress.  Neurological: He is alert.  Skin: Skin is warm and dry. No rash noted.  Psychiatric: He has a normal mood and affect. His behavior is normal.     Labs reviewed: Admission on 02/24/2015, Discharged on 02/27/2015  Component Date Value Ref Range Status  . WBC 02/24/2015 8.3  4.0 - 10.5 K/uL Final  . RBC 02/24/2015 3.91* 4.22 - 5.81 MIL/uL Final  . Hemoglobin 02/24/2015 11.1* 13.0 - 17.0 g/dL Final  . HCT 02/24/2015 35.0* 39.0 - 52.0 % Final  . MCV 02/24/2015 89.5  78.0 - 100.0 fL Final  . MCH 02/24/2015 28.4  26.0 - 34.0  pg Final  . MCHC 02/24/2015 31.7  30.0 - 36.0 g/dL Final  . RDW 02/24/2015 15.3  11.5 - 15.5 % Final  . Platelets 02/24/2015 188  150 - 400 K/uL Final  . Neutrophils Relative % 02/24/2015 86* 43 - 77 % Final  . Neutro Abs 02/24/2015 7.2  1.7 - 7.7 K/uL Final  . Lymphocytes  Relative 02/24/2015 5* 12 - 46 % Final  . Lymphs Abs 02/24/2015 0.4* 0.7 - 4.0 K/uL Final  . Monocytes Relative 02/24/2015 9  3 - 12 % Final  . Monocytes Absolute 02/24/2015 0.7  0.1 - 1.0 K/uL Final  . Eosinophils Relative 02/24/2015 0  0 - 5 % Final  . Eosinophils Absolute 02/24/2015 0.0  0.0 - 0.7 K/uL Final  . Basophils Relative 02/24/2015 0  0 - 1 % Final  . Basophils Absolute 02/24/2015 0.0  0.0 - 0.1 K/uL Final  . Sodium 02/24/2015 136  135 - 145 mmol/L Final  . Potassium 02/24/2015 4.3  3.5 - 5.1 mmol/L Final  . Chloride 02/24/2015 102  101 - 111 mmol/L Final  . CO2 02/24/2015 26  22 - 32 mmol/L Final  . Glucose, Bld 02/24/2015 113* 65 - 99 mg/dL Final  . BUN 02/24/2015 22* 6 - 20 mg/dL Final  . Creatinine, Ser 02/24/2015 1.20  0.61 - 1.24 mg/dL Final  . Calcium 02/24/2015 8.5* 8.9 - 10.3 mg/dL Final  . Total Protein 02/24/2015 5.7* 6.5 - 8.1 g/dL Final  . Albumin 02/24/2015 3.1* 3.5 - 5.0 g/dL Final  . AST 02/24/2015 25  15 - 41 U/L Final  . ALT 02/24/2015 23  17 - 63 U/L Final  . Alkaline Phosphatase 02/24/2015 69  38 - 126 U/L Final  . Total Bilirubin 02/24/2015 2.1* 0.3 - 1.2 mg/dL Final  . GFR calc non Af Amer 02/24/2015 57* >60 mL/min Final  . GFR calc Af Amer 02/24/2015 >60  >60 mL/min Final   Comment: (NOTE) The eGFR has been calculated using the CKD EPI equation. This calculation has not been validated in all clinical situations. eGFR's persistently <60 mL/min signify possible Chronic Kidney Disease.   . Anion gap 02/24/2015 8  5 - 15 Final  . Lipase 02/24/2015 28  22 - 51 U/L Final  . Troponin i, poc 02/24/2015 0.01  0.00 - 0.08 ng/mL Final  . Comment 3 02/24/2015          Final   Comment:  Due to the release kinetics of cTnI, a negative result within the first hours of the onset of symptoms does not rule out myocardial infarction with certainty. If myocardial infarction is still suspected, repeat the test at appropriate intervals.   . B Natriuretic Peptide 02/24/2015 1936.4* 0.0 - 100.0 pg/mL Final  . Prothrombin Time 02/24/2015 23.8* 11.6 - 15.2 seconds Final  . INR 02/24/2015 2.14* 0.00 - 1.49 Final  . Lactic Acid, Venous 02/24/2015 1.71  0.5 - 2.0 mmol/L Final  . Specimen Description 02/24/2015 BLOOD ARM LEFT   Final  . Special Requests 02/24/2015 BOTTLES DRAWN AEROBIC AND ANAEROBIC 5CC   Final  . Culture 02/24/2015    Final                   Value:NO GROWTH 5 DAYS Performed at [facility]   . Report Status 02/24/2015 03/02/2015 FINAL   Final  . Specimen Description 02/24/2015 BLOOD ARM RIGHT   Final  . Special Requests 02/24/2015 BOTTLES DRAWN AEROBIC ONLY 5CC   Final  . Culture 02/24/2015    Final                   Value:NO GROWTH 5 DAYS Performed at [facility]   . Report Status 02/24/2015 03/02/2015 FINAL   Final  . Color, Urine 02/24/2015 YELLOW  YELLOW Final  . APPearance 02/24/2015 CLEAR  CLEAR Final  .  Specific Gravity, Urine 02/24/2015 1.014  1.005 - 1.030 Final  . pH 02/24/2015 6.5  5.0 - 8.0 Final  . Glucose, UA 02/24/2015 NEGATIVE  NEGATIVE mg/dL Final  . Hgb urine dipstick 02/24/2015 NEGATIVE  NEGATIVE Final  . Bilirubin Urine 02/24/2015 NEGATIVE  NEGATIVE Final  . Ketones, ur 02/24/2015 NEGATIVE  NEGATIVE mg/dL Final  . Protein, ur 02/24/2015 NEGATIVE  NEGATIVE mg/dL Final  . Urobilinogen, UA 02/24/2015 1.0  0.0 - 1.0 mg/dL Final  . Nitrite 02/24/2015 NEGATIVE  NEGATIVE Final  . Leukocytes, UA 02/24/2015 NEGATIVE  NEGATIVE Final   MICROSCOPIC NOT DONE ON URINES WITH NEGATIVE PROTEIN, BLOOD, LEUKOCYTES, NITRITE, OR GLUCOSE <1000 mg/dL.  . Troponin I 02/24/2015 0.04* <0.031 ng/mL Final   Comment:        PERSISTENTLY INCREASED  TROPONIN VALUES IN THE RANGE OF 0.04-0.49 ng/mL CAN BE SEEN IN:       -UNSTABLE ANGINA       -CONGESTIVE HEART FAILURE       -MYOCARDITIS       -CHEST TRAUMA       -ARRYHTHMIAS       -LATE PRESENTING MYOCARDIAL INFARCTION       -COPD   CLINICAL FOLLOW-UP RECOMMENDED.   Marland Kitchen Troponin I 02/25/2015 0.04* <0.031 ng/mL Final   Comment:        PERSISTENTLY INCREASED TROPONIN VALUES IN THE RANGE OF 0.04-0.49 ng/mL CAN BE SEEN IN:       -UNSTABLE ANGINA       -CONGESTIVE HEART FAILURE       -MYOCARDITIS       -CHEST TRAUMA       -ARRYHTHMIAS       -LATE PRESENTING MYOCARDIAL INFARCTION       -COPD   CLINICAL FOLLOW-UP RECOMMENDED.   Marland Kitchen Sodium 02/25/2015 134* 135 - 145 mmol/L Final  . Potassium 02/25/2015 4.2  3.5 - 5.1 mmol/L Final  . Chloride 02/25/2015 102  101 - 111 mmol/L Final  . CO2 02/25/2015 24  22 - 32 mmol/L Final  . Glucose, Bld 02/25/2015 106* 65 - 99 mg/dL Final  . BUN 02/25/2015 22* 6 - 20 mg/dL Final  . Creatinine, Ser 02/25/2015 1.29* 0.61 - 1.24 mg/dL Final  . Calcium 02/25/2015 8.8* 8.9 - 10.3 mg/dL Final  . GFR calc non Af Amer 02/25/2015 52* >60 mL/min Final  . GFR calc Af Amer 02/25/2015 >60  >60 mL/min Final   Comment: (NOTE) The eGFR has been calculated using the CKD EPI equation. This calculation has not been validated in all clinical situations. eGFR's persistently <60 mL/min signify possible Chronic Kidney Disease.   . Anion gap 02/25/2015 8  5 - 15 Final  . Prothrombin Time 02/25/2015 21.5* 11.6 - 15.2 seconds Final  . INR 02/25/2015 1.88* 0.00 - 1.49 Final  . Troponin I 02/25/2015 0.05* <0.031 ng/mL Final   Comment:        PERSISTENTLY INCREASED TROPONIN VALUES IN THE RANGE OF 0.04-0.49 ng/mL CAN BE SEEN IN:       -UNSTABLE ANGINA       -CONGESTIVE HEART FAILURE       -MYOCARDITIS       -CHEST TRAUMA       -ARRYHTHMIAS       -LATE PRESENTING MYOCARDIAL INFARCTION       -COPD   CLINICAL FOLLOW-UP RECOMMENDED.   . C difficile by pcr 02/25/2015  NEGATIVE  NEGATIVE Final  . Sodium 02/26/2015 134* 135 - 145 mmol/L Final  . Potassium  02/26/2015 4.3  3.5 - 5.1 mmol/L Final  . Chloride 02/26/2015 102  101 - 111 mmol/L Final  . CO2 02/26/2015 23  22 - 32 mmol/L Final  . Glucose, Bld 02/26/2015 95  65 - 99 mg/dL Final  . BUN 02/26/2015 22* 6 - 20 mg/dL Final  . Creatinine, Ser 02/26/2015 1.27* 0.61 - 1.24 mg/dL Final  . Calcium 02/26/2015 8.3* 8.9 - 10.3 mg/dL Final  . GFR calc non Af Amer 02/26/2015 53* >60 mL/min Final  . GFR calc Af Amer 02/26/2015 >60  >60 mL/min Final   Comment: (NOTE) The eGFR has been calculated using the CKD EPI equation. This calculation has not been validated in all clinical situations. eGFR's persistently <60 mL/min signify possible Chronic Kidney Disease.   . Anion gap 02/26/2015 9  5 - 15 Final  . Prothrombin Time 02/26/2015 25.3* 11.6 - 15.2 seconds Final  . INR 02/26/2015 2.33* 0.00 - 1.49 Final  . Sodium 02/27/2015 135  135 - 145 mmol/L Final  . Potassium 02/27/2015 4.4  3.5 - 5.1 mmol/L Final  . Chloride 02/27/2015 104  101 - 111 mmol/L Final  . CO2 02/27/2015 22  22 - 32 mmol/L Final  . Glucose, Bld 02/27/2015 112* 65 - 99 mg/dL Final  . BUN 02/27/2015 20  6 - 20 mg/dL Final  . Creatinine, Ser 02/27/2015 1.27* 0.61 - 1.24 mg/dL Final  . Calcium 02/27/2015 8.2* 8.9 - 10.3 mg/dL Final  . GFR calc non Af Amer 02/27/2015 53* >60 mL/min Final  . GFR calc Af Amer 02/27/2015 >60  >60 mL/min Final   Comment: (NOTE) The eGFR has been calculated using the CKD EPI equation. This calculation has not been validated in all clinical situations. eGFR's persistently <60 mL/min signify possible Chronic Kidney Disease.   . Anion gap 02/27/2015 9  5 - 15 Final  . Prothrombin Time 02/27/2015 24.8* 11.6 - 15.2 seconds Final  . INR 02/27/2015 2.26* 0.00 - 1.49 Final  Admission on 02/20/2015, Discharged on 02/23/2015  Component Date Value Ref Range Status  . WBC 02/20/2015 9.0  4.0 - 10.5 K/uL Final  . RBC  02/20/2015 4.03* 4.22 - 5.81 MIL/uL Final  . Hemoglobin 02/20/2015 11.7* 13.0 - 17.0 g/dL Final  . HCT 02/20/2015 37.8* 39.0 - 52.0 % Final  . MCV 02/20/2015 93.8  78.0 - 100.0 fL Final  . MCH 02/20/2015 29.0  26.0 - 34.0 pg Final  . MCHC 02/20/2015 31.0  30.0 - 36.0 g/dL Final  . RDW 02/20/2015 15.2  11.5 - 15.5 % Final  . Platelets 02/20/2015 341  150 - 400 K/uL Final  . Neutrophils Relative % 02/20/2015 74  43 - 77 % Final  . Neutro Abs 02/20/2015 6.7  1.7 - 7.7 K/uL Final  . Lymphocytes Relative 02/20/2015 11* 12 - 46 % Final  . Lymphs Abs 02/20/2015 1.0  0.7 - 4.0 K/uL Final  . Monocytes Relative 02/20/2015 14* 3 - 12 % Final  . Monocytes Absolute 02/20/2015 1.3* 0.1 - 1.0 K/uL Final  . Eosinophils Relative 02/20/2015 1  0 - 5 % Final  . Eosinophils Absolute 02/20/2015 0.1  0.0 - 0.7 K/uL Final  . Basophils Relative 02/20/2015 0  0 - 1 % Final  . Basophils Absolute 02/20/2015 0.0  0.0 - 0.1 K/uL Final  . Sodium 02/20/2015 138  135 - 145 mmol/L Final  . Potassium 02/20/2015 4.5  3.5 - 5.1 mmol/L Final  . Chloride 02/20/2015 103  101 - 111 mmol/L Final  .  CO2 02/20/2015 26  22 - 32 mmol/L Final  . Glucose, Bld 02/20/2015 161* 65 - 99 mg/dL Final  . BUN 02/20/2015 45* 6 - 20 mg/dL Final  . Creatinine, Ser 02/20/2015 1.53* 0.61 - 1.24 mg/dL Final  . Calcium 02/20/2015 8.8* 8.9 - 10.3 mg/dL Final  . Total Protein 02/20/2015 6.4* 6.5 - 8.1 g/dL Final  . Albumin 02/20/2015 3.4* 3.5 - 5.0 g/dL Final  . AST 02/20/2015 31  15 - 41 U/L Final  . ALT 02/20/2015 66* 17 - 63 U/L Final  . Alkaline Phosphatase 02/20/2015 97  38 - 126 U/L Final  . Total Bilirubin 02/20/2015 1.7* 0.3 - 1.2 mg/dL Final  . GFR calc non Af Amer 02/20/2015 42* >60 mL/min Final  . GFR calc Af Amer 02/20/2015 49* >60 mL/min Final   Comment: (NOTE) The eGFR has been calculated using the CKD EPI equation. This calculation has not been validated in all clinical situations. eGFR's persistently <60 mL/min signify  possible Chronic Kidney Disease.   . Anion gap 02/20/2015 9  5 - 15 Final  . Prothrombin Time 02/20/2015 25.5* 11.6 - 15.2 seconds Final  . INR 02/20/2015 2.35* 0.00 - 1.49 Final  . Troponin I 02/20/2015 0.03  <0.031 ng/mL Final   Comment:        NO INDICATION OF MYOCARDIAL INJURY.   . Color, Urine 02/20/2015 YELLOW  YELLOW Final  . APPearance 02/20/2015 CLEAR  CLEAR Final  . Specific Gravity, Urine 02/20/2015 >1.030* 1.005 - 1.030 Final  . pH 02/20/2015 5.5  5.0 - 8.0 Final  . Glucose, UA 02/20/2015 NEGATIVE  NEGATIVE mg/dL Final  . Hgb urine dipstick 02/20/2015 SMALL* NEGATIVE Final  . Bilirubin Urine 02/20/2015 NEGATIVE  NEGATIVE Final  . Ketones, ur 02/20/2015 NEGATIVE  NEGATIVE mg/dL Final  . Protein, ur 02/20/2015 NEGATIVE  NEGATIVE mg/dL Final  . Urobilinogen, UA 02/20/2015 0.2  0.0 - 1.0 mg/dL Final  . Nitrite 02/20/2015 NEGATIVE  NEGATIVE Final  . Leukocytes, UA 02/20/2015 NEGATIVE  NEGATIVE Final  . Lactic Acid, Venous 02/20/2015 3.22* 0.5 - 2.0 mmol/L Final  . Fecal Occult Bld 02/20/2015 NEGATIVE  NEGATIVE Final  . Squamous Epithelial / LPF 02/20/2015 RARE  RARE Final  . WBC, UA 02/20/2015 0-2  <3 WBC/hpf Final  . RBC / HPF 02/20/2015 3-6  <3 RBC/hpf Final  . Bacteria, UA 02/20/2015 FEW* RARE Final  . Glucose-Capillary 02/20/2015 118* 65 - 99 mg/dL Final  . Troponin I 02/21/2015 0.05* <0.031 ng/mL Final   Comment:        PERSISTENTLY INCREASED TROPONIN VALUES IN THE RANGE OF 0.04-0.49 ng/mL CAN BE SEEN IN:       -UNSTABLE ANGINA       -CONGESTIVE HEART FAILURE       -MYOCARDITIS       -CHEST TRAUMA       -ARRYHTHMIAS       -LATE PRESENTING MYOCARDIAL INFARCTION       -COPD   CLINICAL FOLLOW-UP RECOMMENDED.   Marland Kitchen Glucose-Capillary 02/20/2015 136* 65 - 99 mg/dL Final  . C difficile by pcr 02/21/2015 POSITIVE* NEGATIVE Final   Comment: CRITICAL RESULT CALLED TO, READ BACK BY AND VERIFIED WITH: T. FAIRING RN 11:40 02/21/15 ([email or web address])   . Glucose-Capillary  02/21/2015 170* 65 - 99 mg/dL Final  . Magnesium 02/21/2015 2.4  1.7 - 2.4 mg/dL Final  . Glucose-Capillary 02/21/2015 137* 65 - 99 mg/dL Final  . Comment 1 02/21/2015 Notify RN   Final  . Glucose-Capillary 02/21/2015 141*  65 - 99 mg/dL Final  . WBC 02/22/2015 9.2  4.0 - 10.5 K/uL Final  . RBC 02/22/2015 3.87* 4.22 - 5.81 MIL/uL Final  . Hemoglobin 02/22/2015 10.8* 13.0 - 17.0 g/dL Final  . HCT 02/22/2015 35.1* 39.0 - 52.0 % Final  . MCV 02/22/2015 90.7  78.0 - 100.0 fL Final  . MCH 02/22/2015 27.9  26.0 - 34.0 pg Final  . MCHC 02/22/2015 30.8  30.0 - 36.0 g/dL Final  . RDW 02/22/2015 15.2  11.5 - 15.5 % Final  . Platelets 02/22/2015 240  150 - 400 K/uL Final  . Sodium 02/22/2015 135  135 - 145 mmol/L Final  . Potassium 02/22/2015 4.4  3.5 - 5.1 mmol/L Final  . Chloride 02/22/2015 101  101 - 111 mmol/L Final  . CO2 02/22/2015 24  22 - 32 mmol/L Final  . Glucose, Bld 02/22/2015 151* 65 - 99 mg/dL Final  . BUN 02/22/2015 31* 6 - 20 mg/dL Final  . Creatinine, Ser 02/22/2015 1.32* 0.61 - 1.24 mg/dL Final  . Calcium 02/22/2015 8.3* 8.9 - 10.3 mg/dL Final  . Total Protein 02/22/2015 5.5* 6.5 - 8.1 g/dL Final  . Albumin 02/22/2015 2.8* 3.5 - 5.0 g/dL Final  . AST 02/22/2015 20  15 - 41 U/L Final  . ALT 02/22/2015 39  17 - 63 U/L Final  . Alkaline Phosphatase 02/22/2015 76  38 - 126 U/L Final  . Total Bilirubin 02/22/2015 1.8* 0.3 - 1.2 mg/dL Final  . GFR calc non Af Amer 02/22/2015 50* >60 mL/min Final  . GFR calc Af Amer 02/22/2015 58* >60 mL/min Final   Comment: (NOTE) The eGFR has been calculated using the CKD EPI equation. This calculation has not been validated in all clinical situations. eGFR's persistently <60 mL/min signify possible Chronic Kidney Disease.   . Anion gap 02/22/2015 10  5 - 15 Final  . Prothrombin Time 02/22/2015 32.2* 11.6 - 15.2 seconds Final  . INR 02/22/2015 3.21* 0.00 - 1.49 Final  . Glucose-Capillary 02/22/2015 113* 65 - 99 mg/dL Final  . Comment 1  02/22/2015 Notify RN   Final  . Comment 2 02/22/2015 Document in Chart   Final  . Glucose-Capillary 02/22/2015 113* 65 - 99 mg/dL Final  . Comment 1 02/22/2015 Notify RN   Final  . Comment 2 02/22/2015 Document in Chart   Final  . Sodium 02/23/2015 137  135 - 145 mmol/L Final  . Potassium 02/23/2015 4.7  3.5 - 5.1 mmol/L Final  . Chloride 02/23/2015 103  101 - 111 mmol/L Final  . CO2 02/23/2015 28  22 - 32 mmol/L Final  . Glucose, Bld 02/23/2015 104* 65 - 99 mg/dL Final  . BUN 02/23/2015 25* 6 - 20 mg/dL Final  . Creatinine, Ser 02/23/2015 1.14  0.61 - 1.24 mg/dL Final  . Calcium 02/23/2015 8.4* 8.9 - 10.3 mg/dL Final  . GFR calc non Af Amer 02/23/2015 >60  >60 mL/min Final  . GFR calc Af Amer 02/23/2015 >60  >60 mL/min Final   Comment: (NOTE) The eGFR has been calculated using the CKD EPI equation. This calculation has not been validated in all clinical situations. eGFR's persistently <60 mL/min signify possible Chronic Kidney Disease.   . Anion gap 02/23/2015 6  5 - 15 Final  . Prothrombin Time 02/23/2015 31.3* 11.6 - 15.2 seconds Final  . INR 02/23/2015 3.09* 0.00 - 1.49 Final  . WBC 02/23/2015 7.4  4.0 - 10.5 K/uL Final  . RBC 02/23/2015 3.64* 4.22 - 5.81  MIL/uL Final  . Hemoglobin 02/23/2015 10.8* 13.0 - 17.0 g/dL Final  . HCT 02/23/2015 33.4* 39.0 - 52.0 % Final  . MCV 02/23/2015 91.8  78.0 - 100.0 fL Final  . MCH 02/23/2015 29.7  26.0 - 34.0 pg Final  . MCHC 02/23/2015 32.3  30.0 - 36.0 g/dL Final  . RDW 02/23/2015 15.3  11.5 - 15.5 % Final  . Platelets 02/23/2015 192  150 - 400 K/uL Final  . Glucose-Capillary 02/22/2015 129* 65 - 99 mg/dL Final  . Glucose-Capillary 02/21/2015 126* 65 - 99 mg/dL Final  Admission on 02/14/2015, Discharged on 02/19/2015  No results displayed because visit has over 200 results.      Dg Chest Port 1 View  02/24/2015   CLINICAL DATA:  Weakness, left lower quadrant pain  EXAM: PORTABLE CHEST - 1 VIEW  COMPARISON:  02/22/2015  FINDINGS:  Cardiomegaly again noted. Status post median sternotomy. No acute infiltrate or pleural effusion. No pulmonary edema.  IMPRESSION: No active disease.   Electronically Signed   By: Lahoma Crocker M.D.   On: 02/24/2015 12:16   Dg Abd Portable 1v  02/24/2015   CLINICAL DATA:  Lower abdominal pain and weakness  EXAM: PORTABLE ABDOMEN - 1 VIEW  COMPARISON:  None.  FINDINGS: Bowel gas pattern unremarkable. No obstruction or free air is seen on this supine examination. There is contrast in the rectum. There are vascular calcifications in the pelvis.  IMPRESSION: Bowel gas pattern unremarkable.   Electronically Signed   By: Lowella Grip III M.D.   On: 02/24/2015 12:18     Assessment/Plan   ICD-9-CM ICD-10-CM   1. Paroxysmal atrial fibrillation 427.31 I48.0   2. Weakness 780.79 R53.1   3. Cardiac pacemaker V45.01 Z95.0   4. Type 2 diabetes mellitus with stage 3 chronic kidney disease, without long-term current use of insulin (HCC) 250.40 E11.22    585.3 N18.3   5. Chronic systolic CHF (congestive heart failure) 428.22 I50.22    428.0    6. Ischemic cardiomyopathy 414.8 I25.5   7. Chronic anticoagulation V58.61 Z79.01   8. Hypothyroidism due to acquired atrophy of thyroid 244.8 E03.8    246.8 E03.4   9. Hyperlipidemia LDL goal <100 272.4 E78.5     Patient is being discharged with home health services:  RN for coumadin mx, PT for gait training and ST for diet upgrade  Patient is being discharged with the following durable medical equipment:  None  Patient has been advised to f/u with their PCP in 1-2 weeks to bring them up to date on their rehab stay.  They were provided with a 30 day supply of scripts for prescription medications and refills must be obtained from their PCP.  TIME SPENT (MINUTES): [place]. Perlie Gold  [facility] [address] [place], [place] [address] [phone] Cell (Monday-Friday 8 AM - 5 PM) [phone] After  5 PM and follow prompts

## 2015-03-31 ENCOUNTER — Other Ambulatory Visit: Payer: Self-pay | Admitting: Licensed Clinical Social Worker

## 2015-03-31 ENCOUNTER — Other Ambulatory Visit: Payer: Self-pay | Admitting: *Deleted

## 2015-03-31 ENCOUNTER — Telehealth: Payer: Self-pay | Admitting: Internal Medicine

## 2015-03-31 DIAGNOSIS — Z950 Presence of cardiac pacemaker: Secondary | ICD-10-CM | POA: Diagnosis not present

## 2015-03-31 DIAGNOSIS — E441 Mild protein-calorie malnutrition: Secondary | ICD-10-CM | POA: Diagnosis not present

## 2015-03-31 DIAGNOSIS — I482 Chronic atrial fibrillation: Secondary | ICD-10-CM | POA: Diagnosis not present

## 2015-03-31 DIAGNOSIS — I5022 Chronic systolic (congestive) heart failure: Secondary | ICD-10-CM | POA: Diagnosis not present

## 2015-03-31 DIAGNOSIS — I255 Ischemic cardiomyopathy: Secondary | ICD-10-CM | POA: Diagnosis not present

## 2015-03-31 DIAGNOSIS — E119 Type 2 diabetes mellitus without complications: Secondary | ICD-10-CM | POA: Diagnosis not present

## 2015-03-31 DIAGNOSIS — Z7901 Long term (current) use of anticoagulants: Secondary | ICD-10-CM | POA: Diagnosis not present

## 2015-03-31 NOTE — Patient Outreach (Signed)
  Assessment:  CSW called client on 03/31/15.  CSW verified identity of client. Client said he had discharged recently from [facility] in [place], [place] and had returned to his home.  Client is scheduled to have in home support for RN, physical therapy and speech therapy.  Client said that no one has contacted him from home health services at present.  He said he was not able to drive himself to needed appointments. He said his wife had broken her hip recently and is staying with their daughter currently in [place], [place].  Client said he can make a sandwich and eat cereal, light meals. He said sometimes he has food delivered.  CSW encouraged Jia to contact his family to assist him in obtaining food items needed to maintain food supply at the home.  CSW called RN [name] on 03/31/15 to inform RN of above information.  CSW asked RN [name] to call home health agency, if possible, to determine when home health services would begin for client.    Plan:  Client to take medications as prescribed and to attend scheduled medical appointments Client to cooperate with scheduled in home care providers.  CSW to call client later this week to assess needs of client.  Kelton Pillar.Analucia Hush MSW, LCSW Licensed Clinical Social Worker [facility] Care Management [phone]

## 2015-03-31 NOTE — Patient Outreach (Signed)
03/31/15- pt home from [facility] per LCSW Lorna Few, pt has been home about 6 days, still does not have home health.  RN CM called Scientist, research (life sciences) at [facility], she says they do have referral and will call RN CM back with start of care date. RN CM called pt, HIPAA verfied, [facility] program explained, pt states " I'm all alone [place] in the woods with no one to help me, I don't drive and have no way to get anywhere, don't have any food, had to order a pizza, can't hardly get out of bed"  Pt states his wife is being cared for at a relative's home and says " they didn't invite me to stay"  Pt demanding and states if RN CM and LCSW aren't going to grocery shop for him and deliver his groceries "then what good are you"  Pt states he is not going to weigh daily and also not going to check blood sugar "because I don't want to"  Pt reports he has all medications, relative did pickup those last week for him, pt states he can afford his medication, pt agreeable to home visit on Thursday 04/03/15.  RN CM reported to [employer] that pt has no transportation and pt reports he has no food.  RN CM and LCSW plan to complete a joint home visit on 04/03/15.  Difficult to complete assessment or acquire other information via telephone as pt seems agitated and raises his voice.  [name] [facility], BSN [facility] Care Coordinator [phone]

## 2015-03-31 NOTE — Patient Outreach (Signed)
Assessment: CSW called [name], step daughter and an emergency contact for client, on 03/31/15. CSW introduced self and verified identity of [name]. [name] said that client was very gruff and demanding and often expected others to care for him.  She said that she thought he had food but did not want to eat food he had.  [name] said she would visit client tomorrow and would take grocery items to client tomorrow around noon. She said client often would not eat in healthy manner.  Client had told CSW previously that client would go out to eat a good bit in the past; client told CSW on 03/31/15 that client had ordered a pizza recently.  Client does have a pacemaker and heart condition diagnosis.  [name] said that family members had been trying to help client with medication management in the past.  CSW informed [name] that [facility] had received home health service orders to help client with nursing, physical therapy and speech therapy support in the home. CSW encouraged [name] to share the above information with spouse of client.  CSW gave [name] the [facility] CSW number of [phone] and encouraged [name] or spouse of client to call CSW to discuss social work needs of client.  Plan: Client to take medications as prescribed and attend scheduled medical appointments. Client to cooperate with in home care providers as scheduled. CSW to conduct home visit with client on 04/03/15 at 11:30 AM to assess needs of client.  Kelton Pillar.Pancho Rushing MSW, LCSW Licensed Clinical Social Worker [facility] Care Management [phone]

## 2015-03-31 NOTE — Telephone Encounter (Signed)
New Message  Pt was implanted by Graciela Husbands. Per pt wanted to be seen in [place] so the scheduler scheduled with Dr. Johney Frame to continue care.

## 2015-04-01 ENCOUNTER — Other Ambulatory Visit: Payer: Self-pay | Admitting: Licensed Clinical Social Worker

## 2015-04-01 DIAGNOSIS — I482 Chronic atrial fibrillation: Secondary | ICD-10-CM | POA: Diagnosis not present

## 2015-04-01 DIAGNOSIS — I255 Ischemic cardiomyopathy: Secondary | ICD-10-CM | POA: Diagnosis not present

## 2015-04-01 DIAGNOSIS — I5022 Chronic systolic (congestive) heart failure: Secondary | ICD-10-CM | POA: Diagnosis not present

## 2015-04-01 DIAGNOSIS — E441 Mild protein-calorie malnutrition: Secondary | ICD-10-CM | POA: Diagnosis not present

## 2015-04-01 DIAGNOSIS — E119 Type 2 diabetes mellitus without complications: Secondary | ICD-10-CM | POA: Diagnosis not present

## 2015-04-01 DIAGNOSIS — Z950 Presence of cardiac pacemaker: Secondary | ICD-10-CM | POA: Diagnosis not present

## 2015-04-01 NOTE — Patient Outreach (Signed)
  Assessment:  CSW called home phone number of client on 04/01/15 and spoke with client via phone on 04/01/15. CSW verified identity of client.  CSW and client spoke of food needs of client.  CSW informed client that CSW had talked on 03/31/15 with [name], step daughter of client, about food needs of client. CSW informed client that [name], stepdaughter of client, planned to bring client food items on 04/01/15 at noon.  Client was appreciative of hearing this information. CSW informed client that [facility] program needed to assess client's general financial needs to see if client could qualify for transport assist through [facility] program. Client agreed to complete [facility] Financial Assessment Form with CSW.  CSW and client completed [facility] Financial Assessment Form for client on 04/01/15.  Client had informed CSW previously that client needed transport assistance to go to and from appointments.  Client said he also needed assist in medication management for client.  CSW and client spoke of fact that RN [name] had scheduled to conduct routine home visit with client on 04/03/15 at 11:30 AM to speak with client about nursing needs of client.  Client said he had a truck, on which he is making payments, but he is not able to drive the truck at present. He said also that a representative of [facility] services had contacted him on 03/31/15 and that [facility] would be working with him in the home as ordered upon his discharge from [facility] in [place], [place].  CSW thanked client for phone conversation.  Client has phone numbers for RN [name] and CSW Lorna Few to contact as needed to discuss current needs of client.  Plan: Client to take medications as prescribed and to attend scheduled medical appointments. Client to communicate with RN [name] to address nursing needs of client. CSW to contact client later this week to assess needs of client  Kelton Pillar.Chezney Huether MSW, LCSW Licensed Clinical  Social Worker [facility] Care Management [phone]

## 2015-04-03 ENCOUNTER — Other Ambulatory Visit: Payer: Self-pay | Admitting: Licensed Clinical Social Worker

## 2015-04-03 ENCOUNTER — Encounter: Payer: Self-pay | Admitting: *Deleted

## 2015-04-03 ENCOUNTER — Other Ambulatory Visit: Payer: Self-pay | Admitting: *Deleted

## 2015-04-03 VITALS — BP 124/60 | HR 63 | Resp 18 | Ht 70.0 in | Wt 170.0 lb

## 2015-04-03 DIAGNOSIS — I509 Heart failure, unspecified: Secondary | ICD-10-CM

## 2015-04-03 NOTE — Patient Outreach (Signed)
Assessment: CSW called Wylene Simmer at [facility] on 04/03/15.  CSW verified identity of Wylene Simmer.  CSW spoke with Lynden Ang about needs of client and CSW and [place] added client to the Meals on [employer] for [place], [place]. Lynden Ang said client was now on Meals on Wheels waiting list; however, she said waiting list could be about 8 months in duration for client.  CSW informed Lynden Ang that CSW will let client know this information. CSW thanked [employer] for her assistance.   Plan:  Client to take medications as prescribed and to attend scheduled medical appointments. Client to communicate with his family regarding current needs of client. Client to communicate with RN [name] as needed to address nursing needs of client. CSW to call client next week to assess needs of client.  Kelton Pillar.Dvora Buitron MSW, LCSW Licensed Clinical Social Worker [facility] Care Management [phone]

## 2015-04-03 NOTE — Patient Outreach (Signed)
[place] [facility]) Care Management  [facility] Social Work  04/03/2015  [name] [birth year] [id number]  Subjective:    Objective:   Current Medications:  Current Outpatient Prescriptions  Medication Sig Dispense Refill  . ALPRAZolam (XANAX) 0.5 MG tablet Take 1 tablet (0.5 mg total) by mouth 3 (three) times daily as needed for anxiety. 9 tablet 0  . amiodarone (PACERONE) 200 MG tablet Take 0.5 tablets (100 mg total) by mouth daily. 15 tablet 6  . atorvastatin (LIPITOR) 20 MG tablet Take 1 tablet (20 mg total) by mouth daily at 6 PM.    . carvedilol (COREG) 3.125 MG tablet Take 1 tablet (3.125 mg total) by mouth 2 (two) times daily with a meal.    . finasteride (PROSCAR) 5 MG tablet Take 5 mg by mouth daily.     . furosemide (LASIX) 40 MG tablet Take 1 tablet (40 mg total) by mouth daily. 30 tablet   . lactose free nutrition (BOOST) LIQD Take 237 mLs by mouth 3 (three) times daily between meals.    Marland Kitchen levothyroxine (SYNTHROID, LEVOTHROID) 125 MCG tablet Take 125 mcg by mouth daily.    Marland Kitchen losartan (COZAAR) 50 MG tablet Take 0.5 tablets (25 mg total) by mouth daily. 30 tablet 6  . saccharomyces boulardii (FLORASTOR) 250 MG capsule Take 1 capsule (250 mg total) by mouth 2 (two) times daily.    . vancomycin (VANCOCIN) 50 mg/mL oral solution Take 2.5 mLs (125 mg total) by mouth 4 (four) times daily. Take for 12 days then stop. 120 mL 0  . warfarin (COUMADIN) 1 MG tablet Take 1 tablet (1 mg total) by mouth daily. Take until seen in coumadin clinic. 30 tablet 0   No current facility-administered medications for this visit.    Functional Status:  In your present state of health, do you have any difficulty performing the following activities: 03/12/2015 02/24/2015  Hearing? N N  Vision? Y Y  Difficulty concentrating or making decisions? Y N  Walking or climbing stairs? Y Y  Dressing or bathing? Y Y  Doing errands, shopping? Y N  Preparing Food and eating ? Y -  Using the Toilet?  Y -  In the past six months, have you accidently leaked urine? N -  Do you have problems with loss of bowel control? N -  Managing your Medications? Y -  Managing your Finances? Y -  Housekeeping or managing your Housekeeping? Y -    Fall/Depression Screening:  PHQ 2/9 Scores 03/12/2015  PHQ - 2 Score 2  PHQ- 9 Score 6    Assessment:   CSW traveled to home of client on 04/03/15 to conduct routine home visit with client.  CSW met with client at home of client on 04/03/15. Client said his step daughter, [name], had brought him food recently.  CSW talked with client about local food pantry support. Client said he had not gone to local churches or food pantries. CSW encouraged client to talk with [name], stepdaughter, to see if she could help him go to local food pantries for support.  CSW and client spoke of Medicaid application process.  CSW wrote down items client would need to collect in order to complete a Medicaid application. CSW gave this list of items needed for Medicaid application to client on 04/03/15.  CSW encouraged client to work with [name], his stepdaughter, to try to gather these information items needed to help him apply for Medicaid.  CSW informed client that client had been  placed on the [facility] transport assist list.  CSW wrote down the name of Damita Rhodie, case management assistant at [facility] and wrote down Alexis Frock' work phone number and gave this information to client on 04/03/15. CSW encouraged client to call Damita as needed related to transport support for client to go to and from scheduled medical appointments.  Client said he had been working with a nurse from [facility] who had visited client recently in the home.  RN Jacqlyn Larsen plans to contact RN working with client from [place] to coordinate client care efforts.  During visit with client, CSW also called [facility] and left phone message for Lelon Frohlich to call CSW at [phone] to see if  client could be added to Meals on Wheels waiting list for [place], [place].  Client was appreciative of home visit by CSW on 04/03/15.    Plan:  Client to take medications as prescribed and to attend scheduled medical appointments. Client to communicate with RN Jacqlyn Larsen to discuss nursing needs of client. Client to communicate with primary doctor to discuss medical needs of client. CSW to call client next week to assess needs of client.  [name].Prentiss Hammett MSW, LCSW Licensed Clinical Social Worker [facility] Care Management [phone]

## 2015-04-03 NOTE — Patient Outreach (Addendum)
[place] [facility]) Care Management   04/03/2015  [name] 13-Dec-1934 [id number]  [name] is an 80 y.o. male  Subjective: Initial home visit and transition of care week 1, HIPAA verified, met pt at door and pt states " I'm sorry about my house, my wife is a hoarder"  Pt states his wife temporarily living with family members for health condition and says " but I wasn't invited to live with them and I have no one to help me, I don't even hardly have enough food"  Pt states he cannot drive, has no transportation and no reliable caregiver, says [name] his daughter in law helps maybe 2 times per week and that [name] is very busy and can only do so much.  Pt reports home health nurse " helped me get my medicine straight" pt says he does not have a glucometer and is not going to ever check blood sugar stating his blood sugar has been good when doctor checks it, pt says he has scales but does not weigh and says he will start weighing and keep a log.  Pt states " my biggest need is needing food and getting someone to help me here"  Pt says he has considered going to assisted living but would need to discuss this option with his wife.  Objective:   Filed Vitals:   04/03/15 1211  BP: 124/60  Pulse: 63  Resp: 18  Height: 1.778 m (_0 )  Weight: 170 lb (77.111 kg)  SpO2: 97%   ROS  Physical Exam  Constitutional: He is oriented to person, place, and time. He appears well-developed.  HENT:  Head: Normocephalic.  Neck: Normal range of motion.  Cardiovascular: Normal rate and regular rhythm.   Respiratory: Breath sounds normal.  GI: Soft. Bowel sounds are normal.  Musculoskeletal: Normal range of motion. He exhibits no edema.  Neurological: He is alert and oriented to person, place, and time.  Skin: Skin is warm and dry.  Psychiatric: He has a normal mood and affect. His behavior is normal. Thought content normal.  Pt agitated, anxious.    Current Medications:   Current  Outpatient Prescriptions  Medication Sig Dispense Refill  . amiodarone (PACERONE) 200 MG tablet Take 0.5 tablets (100 mg total) by mouth daily. 15 tablet 6  . atorvastatin (LIPITOR) 20 MG tablet Take 1 tablet (20 mg total) by mouth daily at 6 PM.    . carvedilol (COREG) 3.125 MG tablet Take 1 tablet (3.125 mg total) by mouth 2 (two) times daily with a meal.    . finasteride (PROSCAR) 5 MG tablet Take 5 mg by mouth daily.     . furosemide (LASIX) 40 MG tablet Take 1 tablet (40 mg total) by mouth daily. 30 tablet   . lactose free nutrition (BOOST) LIQD Take 237 mLs by mouth 3 (three) times daily between meals.    Marland Kitchen levothyroxine (SYNTHROID, LEVOTHROID) 88 MCG tablet Take 88 mcg by mouth daily before breakfast.    . losartan (COZAAR) 50 MG tablet Take 0.5 tablets (25 mg total) by mouth daily. 30 tablet 6  . saccharomyces boulardii (FLORASTOR) 250 MG capsule Take 1 capsule (250 mg total) by mouth 2 (two) times daily.    Marland Kitchen warfarin (COUMADIN) 1 MG tablet Take 1 tablet (1 mg total) by mouth daily. Take until seen in coumadin clinic. 30 tablet 0  . ALPRAZolam (XANAX) 0.5 MG tablet Take 1 tablet (0.5 mg total) by mouth 3 (three) times daily as  needed for anxiety. (Patient not taking: Reported on 04/03/2015) 9 tablet 0  . vancomycin (VANCOCIN) 50 mg/mL oral solution Take 2.5 mLs (125 mg total) by mouth 4 (four) times daily. Take for 12 days then stop. (Patient not taking: Reported on 04/03/2015) 120 mL 0   No current facility-administered medications for this visit.    Functional Status:   In your present state of health, do you have any difficulty performing the following activities: 04/03/2015 03/12/2015  Hearing? N N  Vision? Y Y  Difficulty concentrating or making decisions? Tempie Donning  Walking or climbing stairs? Y Y  Dressing or bathing? Y Y  Doing errands, shopping? Tempie Donning  Preparing Food and eating ? Y Y  Using the Toilet? N Y  In the past six months, have you accidently leaked urine? N N  Do you have  problems with loss of bowel control? N N  Managing your Medications? Y Y  Managing your Finances? Tempie Donning  Housekeeping or managing your Housekeeping? Tempie Donning    Fall/Depression Screening:    PHQ 2/9 Scores 04/03/2015 03/12/2015  PHQ - 2 Score 2 2  PHQ- 9 Score 6 6    Assessment: Joint visit with [facility] CSW Nicki Reaper Forrest as CSW is assisting pt with transporation and food procurement issues as well as pt has anxiety, no available/ reliable caregiver.  RN CM observed in patient's kitchen and refrigerator, pt has lots of canned soups high in sodium, has luncheon meats, bread, eggs and enough staples to get him through for awhile.  RN CM reviewed importance of low sodium diet and pt says he eats what he can get.  Empty pizza box lying on the floor as pt does order out sometimes and have delivered. RN CM called [place], spoke with Anderson Malta RN for collaboration, Anderson Malta says pt is on services for entire certification period, at least 2 months for HF, coumadin management and may be recertified after 2 months if still homebound, pt refused PT and ST, has RN only. RN CM called Dr. Silvestre Mesi office, spoke with assistant Estill Bamberg and reported pt not checking CBG and has no glucometer, Estill Bamberg says Hgb AIC in February is 5.7 and managed well, and they are aware pt not checking CBG.  RN CM reported pt has alprazolom listed on medication list in epic but RN CM cannot find medication in the home, Estill Bamberg says she will call refill into [facility] ( RN CM called pt and informed him of this).  Pt has no scheduled appointment with MD and Estill Bamberg says when pt has transportation secured with assistance of CSW, she requests CSW call their office to inform and then Estill Bamberg will make pt a follow up appointment (RN CM also let pt know this).  RN CM sent note to  [place] and ask him to let MD office know when transportation is secured. Pt refuses [facility] calendar for organization of appointments,  Pt refuses home health physical  therapy although he is high risk for falls, RN CM reviewed safety precautions, pt home is very cluttered with papers, boxes, clothes etc hoarded in most of the rooms. RN CM sent order to [place] for application low income subsidies as pt says he cannot afford medication. RN CM faxed today's visit note and barrier letter to Dr. Silvestre Mesi office.  [facility] CM Care Plan Problem One        Patient Outreach Telephone from 03/31/2015 in [place]   Interventions for Problem One  Long Term Goal  Goal met.  Client participated in scheduled physical therpay sessions for client at facility. Goal met. Client has now discharged home.   [facility] CM Short Term Goal #1 Met Date  03/31/15   Interventions for Short Term Goal #1  Client met goal. He did participate in scheduled physical therapy sessions for client at the facility.  Client has discharged home.    [place] Problem Two        Patient Outreach from 04/03/2015 in [place]   Interventions for Short Term Goal #2   CSW encouraged client on 04/03/15 to contact at least one local food pantry to see food assistance for client.  CSW called [facility] on 04/03/15 and left phone message for Lelon Frohlich to return call to CSW to discuss adding client to Meals on Wheels waiting list.    [place] Problem Three        Patient Outreach from 04/03/2015 in [name] Problem Three  Knowledge deficit related to congestive heart failure   Care Plan for Problem Three  Active   [facility] Long Term Goal (31-90) days  pt will have no readmissions related to heart failure within 90 days.   [facility] Long Term Goal Start Date  04/03/15   Interventions for Problem Three Long Term Goal  RN CM provided CHF packet with CHF zones and reviewed with pt,  Pt has a working International aid/development worker CM ask pt to start weighing daily and recording and wrote out instructions for pt as reminder.   [facility] CM Short Term Goal #1 (0-30  days)  pt will weigh daily and record within 30 days.   [facility] CM Short Term Goal #1 Start Date  04/03/15   Interventions for Short Term Goal #1  RN CM explained importance of daily weights and calling early for weight gain, change in symptoms, health status, reviewed resources to call (home health, MD RN CM, 911)     Plan: Continue weekly transition of care calls follow up with home visit 04/24/15 Reinforce CHF teaching, zones, low sodium diet. Review medications Collaborate with home health, CSW as needed (regarding food stamp and medicaid application to be completed as pt needs assistance with this).  Jacqlyn Larsen [facility] [place] Coordinator [phone]

## 2015-04-04 ENCOUNTER — Other Ambulatory Visit: Payer: Self-pay | Admitting: Licensed Clinical Social Worker

## 2015-04-04 DIAGNOSIS — I255 Ischemic cardiomyopathy: Secondary | ICD-10-CM | POA: Diagnosis not present

## 2015-04-04 DIAGNOSIS — E441 Mild protein-calorie malnutrition: Secondary | ICD-10-CM | POA: Diagnosis not present

## 2015-04-04 DIAGNOSIS — Z950 Presence of cardiac pacemaker: Secondary | ICD-10-CM | POA: Diagnosis not present

## 2015-04-04 DIAGNOSIS — I5022 Chronic systolic (congestive) heart failure: Secondary | ICD-10-CM | POA: Diagnosis not present

## 2015-04-04 DIAGNOSIS — E119 Type 2 diabetes mellitus without complications: Secondary | ICD-10-CM | POA: Diagnosis not present

## 2015-04-04 DIAGNOSIS — I482 Chronic atrial fibrillation: Secondary | ICD-10-CM | POA: Diagnosis not present

## 2015-04-04 NOTE — Patient Outreach (Signed)
[facility]) Care Management  04/04/2015  [name] September 25, 1935 [id number]   Notification received from [name], RN to outreach patient to assist in completing extra help application. I called [name] today to enroll him into Extra Help.  He was willing to do it over the phone.  We completed the application online and submitted it today.  He should hear from social security administration in the next 2 to 4 weeks.  I informed his RN and SW case manager that once he gets the letter in the mail, to have him give me a call because he stated that he could not write down my contact information.  [name] [facility] Care Management Assistant [phone]

## 2015-04-04 NOTE — Patient Outreach (Signed)
Assessment: CSW called phone number for [name], step daughter of client, on 04/04/15.  CSW left phone message requesting [name] please return call to CSW at [phone] to discuss current needs of client.  CSW later received return call from [facility].  CSW verified identity of [name].  CSW and [name] spoke of current needs of client.  CSW informed [name] that CSW had called [facility] and had added client to Meals on Wheels waiting list. However, CSW informed [name] that waiting list for Meals on Wheels support for client could be about 8 months in duration. CSW informed [name] that client is receiving in home support through [facility] Nursing support). CSW informed [name] of items that would need to be collected by client and family to help client complete Medicaid application. [name] understood this information.  CSW informed [name] that Rochele Pages Rhodie had called client recently and that Damita had helped client complete an on line application for Extra Help support for client through the [employer] CSW gave [name] the return [facility] CSW number of [phone] and encouraged her or client to call CSW as needed to discuss social work needs of client.    Plan: Client to take medications as prescribed and to attend scheduled medical appointments. Client to communicate with RN [name] related to nursing needs of client. CSW to call client in one week to assess needs of client.  Kelton Pillar.Vani Gunner MSW, LCSW Licensed Clinical Social Worker [facility] Care Management [phone]

## 2015-04-04 NOTE — Patient Outreach (Signed)
[facility]) Care Management  04/04/2015  ZAM ENT 03-01-1935 [id number]   Request from [name], RN for assistance with Extra Help application, assigned Steve Rattler, PharmD and Lesle Reek, [employer] Assistant.  Corrie Mckusick. Sharlee Blew [facility] Care Management [facility] CM Assistant Phone: [phone] Fax: [phone]

## 2015-04-07 DIAGNOSIS — I5022 Chronic systolic (congestive) heart failure: Secondary | ICD-10-CM | POA: Diagnosis not present

## 2015-04-07 DIAGNOSIS — I482 Chronic atrial fibrillation: Secondary | ICD-10-CM | POA: Diagnosis not present

## 2015-04-07 DIAGNOSIS — Z950 Presence of cardiac pacemaker: Secondary | ICD-10-CM | POA: Diagnosis not present

## 2015-04-07 DIAGNOSIS — I255 Ischemic cardiomyopathy: Secondary | ICD-10-CM | POA: Diagnosis not present

## 2015-04-07 DIAGNOSIS — E441 Mild protein-calorie malnutrition: Secondary | ICD-10-CM | POA: Diagnosis not present

## 2015-04-07 DIAGNOSIS — E119 Type 2 diabetes mellitus without complications: Secondary | ICD-10-CM | POA: Diagnosis not present

## 2015-04-08 ENCOUNTER — Other Ambulatory Visit: Payer: Self-pay | Admitting: *Deleted

## 2015-04-08 ENCOUNTER — Other Ambulatory Visit: Payer: Self-pay | Admitting: Pharmacist

## 2015-04-08 NOTE — Patient Outreach (Signed)
[facility]) Care Management  [facility]   04/08/2015  [name] 05-May-1935 [id number]  Subjective: [name] is a 80 y.o. male who was referred to pharmacy for medication management.  I called patient to set up home visit and he reports that he "can't see good, can't talk good, can't hear good." He reports that he lines his medications up on his counter and takes his medications. He would like to discuss his medications.   Objective:   Current Medications: Current Outpatient Prescriptions  Medication Sig Dispense Refill  . ALPRAZolam (XANAX) 0.5 MG tablet Take 1 tablet (0.5 mg total) by mouth 3 (three) times daily as needed for anxiety. (Patient not taking: Reported on 04/03/2015) 9 tablet 0  . amiodarone (PACERONE) 200 MG tablet Take 0.5 tablets (100 mg total) by mouth daily. 15 tablet 6  . atorvastatin (LIPITOR) 20 MG tablet Take 1 tablet (20 mg total) by mouth daily at 6 PM.    . carvedilol (COREG) 3.125 MG tablet Take 1 tablet (3.125 mg total) by mouth 2 (two) times daily with a meal.    . finasteride (PROSCAR) 5 MG tablet Take 5 mg by mouth daily.     . furosemide (LASIX) 40 MG tablet Take 1 tablet (40 mg total) by mouth daily. 30 tablet   . lactose free nutrition (BOOST) LIQD Take 237 mLs by mouth 3 (three) times daily between meals.    Marland Kitchen levothyroxine (SYNTHROID, LEVOTHROID) 88 MCG tablet Take 88 mcg by mouth daily before breakfast.    . losartan (COZAAR) 50 MG tablet Take 0.5 tablets (25 mg total) by mouth daily. 30 tablet 6  . saccharomyces boulardii (FLORASTOR) 250 MG capsule Take 1 capsule (250 mg total) by mouth 2 (two) times daily.    . vancomycin (VANCOCIN) 50 mg/mL oral solution Take 2.5 mLs (125 mg total) by mouth 4 (four) times daily. Take for 12 days then stop. (Patient not taking: Reported on 04/03/2015) 120 mL 0  . warfarin (COUMADIN) 1 MG tablet Take 1 tablet (1 mg total) by mouth daily. Take until seen in coumadin clinic. 30 tablet 0    No current facility-administered medications for this visit.    Functional Status: In your present state of health, do you have any difficulty performing the following activities: 04/03/2015 03/12/2015  Hearing? N N  Vision? Y Y  Difficulty concentrating or making decisions? [name]  Walking or climbing stairs? Y Y  Dressing or bathing? Y Y  Doing errands, shopping? [name]  Preparing Food and eating ? Y Y  Using the Toilet? N Y  In the past six months, have you accidently leaked urine? N N  Do you have problems with loss of bowel control? N N  Managing your Medications? Y Y  Managing your Finances? [name]  Housekeeping or managing your Housekeeping? [name]    Fall/Depression Screening: PHQ 2/9 Scores 04/03/2015 03/12/2015  PHQ - 2 Score 2 2  PHQ- 9 Score 6 6    Assessment: 1. Medication management: patient confused on what his medications are and what they are for. Would benefit from home visit for medication education and review. Unable to assess adherence until home visit.   Plan: 1. Medication management: home visit scheduled for 04/10/15 at 12 pm. Patient verbalized understanding.    Juanita Craver, PharmD, BCPS Pharmacy Resident [facility] [phone]

## 2015-04-08 NOTE — Patient Outreach (Signed)
04/08/15- Telephone call received from Particia Jasper RN with [facility], reported she is not getting her CSW involved since [facility] CSW is already involved and this would cause confusion for pt and is unnecessary, RN CM gave update that RN CM is working with pt on CHF, weighing daily, low salt diet, Angie RN states pt is weighing but not logging and does not seem motivated to want to do this aspect of his care and not motivated to learn much of anything.  Angie states pt may be interested in moving to assisted lliving and she will let [facility] know if this is what pt decides, RN CM reported that [facility] CSW is to assist pt complete medicaid and food stamp application in the home as pt unable to go to department of social services and has no one to take him or go with him, also that pt placed on list for meals on wheels.  Angie states pt has low level of education and has difficulty grasping concepts and is also very anxious which hinders his learning.  RN CM sent In Basket to [employer] CSW reporting the above.  [name] [facility], BSN [facility] Community Care Coordinator [phone]

## 2015-04-10 ENCOUNTER — Other Ambulatory Visit: Payer: Self-pay | Admitting: Pharmacist

## 2015-04-10 DIAGNOSIS — I482 Chronic atrial fibrillation: Secondary | ICD-10-CM | POA: Diagnosis not present

## 2015-04-10 DIAGNOSIS — I255 Ischemic cardiomyopathy: Secondary | ICD-10-CM | POA: Diagnosis not present

## 2015-04-10 DIAGNOSIS — E441 Mild protein-calorie malnutrition: Secondary | ICD-10-CM | POA: Diagnosis not present

## 2015-04-10 DIAGNOSIS — I5022 Chronic systolic (congestive) heart failure: Secondary | ICD-10-CM | POA: Diagnosis not present

## 2015-04-10 DIAGNOSIS — Z950 Presence of cardiac pacemaker: Secondary | ICD-10-CM | POA: Diagnosis not present

## 2015-04-10 DIAGNOSIS — E119 Type 2 diabetes mellitus without complications: Secondary | ICD-10-CM | POA: Diagnosis not present

## 2015-04-10 NOTE — Patient Outreach (Signed)
[facility]) Care Management  [facility]   04/10/2015  [name] 04-04-1935 [id number]  Subjective: [name] is a.age male who was referred to [facility] CM Pharmacy for medication education. Patient spoke with Lesle Reek, care management assistant, and was confused on his medications.   I went to the patient's home with Phil Dopp) Orson Aloe, PharmD, [facility] Pharmacy Resident.  Patient had his medications laid out in his kitchen. He had the indication for his medications written on each bottle. He also had a napkin with the days written on it and the dose of warfarin he was supposed to take (he does not take the same dose everyday).  He denied that he had any questions about his medications. He reports that he gets his medications from [facility]. He denies forgetting to take his medications or having any issues obtaining them.   He is currently working with Damita Rhodie on medication assistance.   Objective:   Current Medications: Current Outpatient Prescriptions  Medication Sig Dispense Refill  . amiodarone (PACERONE) 200 MG tablet Take 0.5 tablets (100 mg total) by mouth daily. 15 tablet 6  . atorvastatin (LIPITOR) 20 MG tablet Take 1 tablet (20 mg total) by mouth daily at 6 PM.    . carvedilol (COREG) 3.125 MG tablet Take 1 tablet (3.125 mg total) by mouth 2 (two) times daily with a meal.    . finasteride (PROSCAR) 5 MG tablet Take 5 mg by mouth daily.     . furosemide (LASIX) 40 MG tablet Take 1 tablet (40 mg total) by mouth daily. 30 tablet   . lactose free nutrition (BOOST) LIQD Take 237 mLs by mouth 3 (three) times daily between meals.    Marland Kitchen levothyroxine (SYNTHROID, LEVOTHROID) 88 MCG tablet Take 88 mcg by mouth daily before breakfast.    . losartan (COZAAR) 50 MG tablet Take 0.5 tablets (25 mg total) by mouth daily. 30 tablet 6  . saccharomyces boulardii (FLORASTOR) 250 MG capsule Take 1 capsule (250 mg total) by mouth 2 (two) times daily.     Marland Kitchen warfarin (COUMADIN) 1 MG tablet Take 1 tablet (1 mg total) by mouth daily. Take until seen in coumadin clinic. 30 tablet 0  . ALPRAZolam (XANAX) 0.5 MG tablet Take 1 tablet (0.5 mg total) by mouth 3 (three) times daily as needed for anxiety. (Patient not taking: Reported on 04/10/2015) 9 tablet 0  . vancomycin (VANCOCIN) 50 mg/mL oral solution Take 2.5 mLs (125 mg total) by mouth 4 (four) times daily. Take for 12 days then stop. (Patient not taking: Reported on 04/03/2015) 120 mL 0   No current facility-administered medications for this visit.    Functional Status: In your present state of health, do you have any difficulty performing the following activities: 04/03/2015 03/12/2015  Hearing? N N  Vision? Y Y  Difficulty concentrating or making decisions? [name]  Walking or climbing stairs? Y Y  Dressing or bathing? Y Y  Doing errands, shopping? [name]  Preparing Food and eating ? Y Y  Using the Toilet? N Y  In the past six months, have you accidently leaked urine? N N  Do you have problems with loss of bowel control? N N  Managing your Medications? Y Y  Managing your Finances? [name]  Housekeeping or managing your Housekeeping? [name]    Fall/Depression Screening: PHQ 2/9 Scores 04/03/2015 03/12/2015  PHQ - 2 Score 2 2  PHQ- 9 Score 6 6  Assessment: 1. Medication management: patient not interested in further management and denied any questions. He appeared to be adherent to his medications based on pill count and fill dates.   Plan: 1. Medication management: will close out of pharmacy program as patient denies need for pharmacy assistance. Recommend that patient get a pill box. Will recommend to [name], [facility], to take pill box on next home visit as patient is interested in having one. However, I am not sure that he could coordinate filling it. Patient appears to be able to managed medications adequately at this point and can continue to work with Rochele Pages Rhodie for medication assistance.     Juanita Craver, PharmD, BCPS Pharmacy Resident [facility] [phone]

## 2015-04-11 ENCOUNTER — Other Ambulatory Visit: Payer: Self-pay | Admitting: *Deleted

## 2015-04-11 NOTE — Patient Outreach (Signed)
04/11/15-  Telephone call for transition of care week 2, spoke with pt, HIPAA verified, pt states " I"m doing a little better" pt reports he drove to town for the first time to buy groceries and says " I have more food now than when you were here"  Pt states he is weighing daily but not logging, weight today 177 pounds and pt states he is eating much better in past week, pt says his goal is to gain " a little more weight"   Pt states his wife is still with relatives and comes home once weekly to visit her pet birds.  Pt denies edema or dyspnea, reports he has all medications and getting coumadin refilled,  RN CM reminded pt to call for 3 pound weight gain overnight or 5 pound weight gain in one week, pt says home health "asking" him about his weight and visiting every week.  [facility] CM Care Plan Problem One        Patient Outreach Telephone from 03/31/2015 in [place]   Interventions for Problem One Long Term Goal  Goal met.  Client participated in scheduled physical therpay sessions for client at facility. Goal met. Client has now discharged home.   [facility] CM Short Term Goal #1 Met Date  03/31/15   Interventions for Short Term Goal #1  Client met goal. He did participate in scheduled physical therapy sessions for client at the facility.  Client has discharged home.    [place] Problem Two        Patient Outreach from 04/03/2015 in [place]   Interventions for Short Term Goal #2   CSW encouraged client on 04/03/15 to contact at least one local food pantry to see food assistance for client.  CSW called Aging, Disability and Transient Services on 04/03/15 and left phone message for Lelon Frohlich to return call to CSW to discuss adding client to Meals on Wheels waiting list.    [place] Problem Three        Patient Outreach Telephone from 04/11/2015 in [place] Problem Three  Knowledge deficit related to congestive heart failure   Care Plan for Problem  Three  Active   [facility] Long Term Goal (31-90) days  pt will have no readmissions related to heart failure within 90 days.   [facility] Long Term Goal Start Date  04/03/15   Interventions for Problem Three Long Term Goal  RN CM reinforced CHF zones   [facility] CM Short Term Goal #1 (0-30 days)  pt will weigh daily and record within 30 days.   [facility] CM Short Term Goal #1 Start Date  04/03/15   Interventions for Short Term Goal #1  RN CM reinforced importance of daily weights and calling early for weight gain, change in symptoms, health status, reviewed resources to call (home health, MD RN CM, 911)     PLAN- continue weekly transition of care calls  Jacqlyn Larsen [facility], [place] Coordinator [phone]

## 2015-04-14 DIAGNOSIS — I509 Heart failure, unspecified: Secondary | ICD-10-CM | POA: Diagnosis not present

## 2015-04-14 DIAGNOSIS — A047 Enterocolitis due to Clostridium difficile: Secondary | ICD-10-CM | POA: Diagnosis not present

## 2015-04-14 DIAGNOSIS — I10 Essential (primary) hypertension: Secondary | ICD-10-CM | POA: Diagnosis not present

## 2015-04-14 DIAGNOSIS — E611 Iron deficiency: Secondary | ICD-10-CM | POA: Diagnosis not present

## 2015-04-14 DIAGNOSIS — E1140 Type 2 diabetes mellitus with diabetic neuropathy, unspecified: Secondary | ICD-10-CM | POA: Diagnosis not present

## 2015-04-14 DIAGNOSIS — I480 Paroxysmal atrial fibrillation: Secondary | ICD-10-CM | POA: Diagnosis not present

## 2015-04-14 DIAGNOSIS — E039 Hypothyroidism, unspecified: Secondary | ICD-10-CM | POA: Diagnosis not present

## 2015-04-14 DIAGNOSIS — Z950 Presence of cardiac pacemaker: Secondary | ICD-10-CM | POA: Diagnosis not present

## 2015-04-14 DIAGNOSIS — R634 Abnormal weight loss: Secondary | ICD-10-CM | POA: Diagnosis not present

## 2015-04-17 ENCOUNTER — Other Ambulatory Visit: Payer: Self-pay | Admitting: Licensed Clinical Social Worker

## 2015-04-17 DIAGNOSIS — I255 Ischemic cardiomyopathy: Secondary | ICD-10-CM | POA: Diagnosis not present

## 2015-04-17 DIAGNOSIS — E441 Mild protein-calorie malnutrition: Secondary | ICD-10-CM | POA: Diagnosis not present

## 2015-04-17 DIAGNOSIS — E119 Type 2 diabetes mellitus without complications: Secondary | ICD-10-CM | POA: Diagnosis not present

## 2015-04-17 DIAGNOSIS — I482 Chronic atrial fibrillation: Secondary | ICD-10-CM | POA: Diagnosis not present

## 2015-04-17 DIAGNOSIS — I5022 Chronic systolic (congestive) heart failure: Secondary | ICD-10-CM | POA: Diagnosis not present

## 2015-04-17 DIAGNOSIS — Z950 Presence of cardiac pacemaker: Secondary | ICD-10-CM | POA: Diagnosis not present

## 2015-04-17 NOTE — Patient Outreach (Signed)
Assessment: CSW called [name] Jasper, RN at [facility], [phone]) on 04/17/15 and left phone message for [name] regarding current social work needs of client.  CSW inivited [name] to return call to CSW at [phone] as needed to further discuss CSW needs of client.  CSW informed [name] in phone message left for her, that CSW had talked with client and step daughter of client about food stamps application for client and about Medicaid application for client.  CSW informed [name] in phone message that CSW had given [name], stepdaughter of client, a list of items to collect to prepare for completion of a Medicaid application of client.  Client has memory issues and could not remember items needed.  [name] has been bringing food items to client at home of client to assist with his meal needs.  CSW to continue to collaborate with RN [name] in monitoring needs of client.  Plan: Client to take medications as prescribed and to attend scheduled medical appointments. Client to continue to communicate as needed with [name], case management assistant, in arranging for transport support for client to scheduled medical appointments. CSW to call client/[name] in one week to assess needs of client.  [name].[name] [name], [name] Licensed Clinical Social Worker [facility] Care Management [phone]

## 2015-04-18 ENCOUNTER — Encounter: Payer: Self-pay | Admitting: Licensed Clinical Social Worker

## 2015-04-18 ENCOUNTER — Other Ambulatory Visit: Payer: Self-pay | Admitting: *Deleted

## 2015-04-18 NOTE — Patient Outreach (Signed)
[facility]) Care Management  [facility] Social Work  04/18/2015  [name] [birth year] [id number]  Subjective:    Objective:   Current Medications:  Current Outpatient Prescriptions  Medication Sig Dispense Refill  . ALPRAZolam (XANAX) 0.5 MG tablet Take 1 tablet (0.5 mg total) by mouth 3 (three) times daily as needed for anxiety. (Patient not taking: Reported on 04/10/2015) 9 tablet 0  . amiodarone (PACERONE) 200 MG tablet Take 0.5 tablets (100 mg total) by mouth daily. 15 tablet 6  . atorvastatin (LIPITOR) 20 MG tablet Take 1 tablet (20 mg total) by mouth daily at 6 PM.    . carvedilol (COREG) 3.125 MG tablet Take 1 tablet (3.125 mg total) by mouth 2 (two) times daily with a meal.    . finasteride (PROSCAR) 5 MG tablet Take 5 mg by mouth daily.     . furosemide (LASIX) 40 MG tablet Take 1 tablet (40 mg total) by mouth daily. 30 tablet   . lactose free nutrition (BOOST) LIQD Take 237 mLs by mouth 3 (three) times daily between meals.    Marland Kitchen levothyroxine (SYNTHROID, LEVOTHROID) 88 MCG tablet Take 88 mcg by mouth daily before breakfast.    . losartan (COZAAR) 50 MG tablet Take 0.5 tablets (25 mg total) by mouth daily. 30 tablet 6  . saccharomyces boulardii (FLORASTOR) 250 MG capsule Take 1 capsule (250 mg total) by mouth 2 (two) times daily.    . vancomycin (VANCOCIN) 50 mg/mL oral solution Take 2.5 mLs (125 mg total) by mouth 4 (four) times daily. Take for 12 days then stop. (Patient not taking: Reported on 04/03/2015) 120 mL 0  . warfarin (COUMADIN) 1 MG tablet Take 1 tablet (1 mg total) by mouth daily. Take until seen in coumadin clinic. 30 tablet 0   No current facility-administered medications for this visit.    Functional Status:  In your present state of health, do you have any difficulty performing the following activities: 04/03/2015 03/12/2015  Hearing? N N  Vision? Y Y  Difficulty concentrating or making decisions? [name]  Walking or climbing stairs? Y Y  Dressing  or bathing? Y Y  Doing errands, shopping? [name]  Preparing Food and eating ? Y Y  Using the Toilet? N Y  In the past six months, have you accidently leaked urine? N N  Do you have problems with loss of bowel control? N N  Managing your Medications? Y Y  Managing your Finances? [name]  Housekeeping or managing your Housekeeping? [name]    Fall/Depression Screening:  PHQ 2/9 Scores 04/03/2015 03/12/2015  PHQ - 2 Score 2 2  PHQ- 9 Score 6 6   Assessment:  CSW called [facility] Department of Social Services on 04/18/15 and spoke with Myrla Halsted, Medicaid caseworker about Medicaid eligibility for client.  CSW talked with Misty Stanley about monthly income of client and about unpaid medical bills of client.  After hearing information regarding client's basic finances, Myrla Halsted informed CSW that client would not be able to qualify for Medicaid benefit. Misty Stanley reported to CSW that client's monthly income was too high for him to be able to qualify for Medicaid at present.  CSW also asked Misty Stanley about food stamp support benefits for client.  Misty Stanley advised CSW to call Charlann Boxer at [facility] Department of Social Services to talk with Charlann Boxer more about food stamp application and possible assistance for client with that service.  CSW thanked [employer] for phone conversation on  04/18/15.  Plan: CSW informed RN [name] of above information on 04/18/15. CSW to call Charlann Boxer at [facility] Department of Social Services next week to talk with Kriste Basque about food stamp application/possible assistance for client with that service.  Kelton Pillar.Ave Scharnhorst MSW, LCSW Licensed Clinical Social Worker [facility] Care Management [phone] Plan:

## 2015-04-18 NOTE — Patient Outreach (Signed)
[facility]) Care Management  [facility] Social Work  04/18/2015  MALIQ PILLEY 22-Dec-1934 [id number]  Subjective:    Objective:   Current Medications:  Current Outpatient Prescriptions  Medication Sig Dispense Refill  . ALPRAZolam (XANAX) 0.5 MG tablet Take 1 tablet (0.5 mg total) by mouth 3 (three) times daily as needed for anxiety. (Patient not taking: Reported on 04/10/2015) 9 tablet 0  . amiodarone (PACERONE) 200 MG tablet Take 0.5 tablets (100 mg total) by mouth daily. 15 tablet 6  . atorvastatin (LIPITOR) 20 MG tablet Take 1 tablet (20 mg total) by mouth daily at 6 PM.    . carvedilol (COREG) 3.125 MG tablet Take 1 tablet (3.125 mg total) by mouth 2 (two) times daily with a meal.    . finasteride (PROSCAR) 5 MG tablet Take 5 mg by mouth daily.     . furosemide (LASIX) 40 MG tablet Take 1 tablet (40 mg total) by mouth daily. 30 tablet   . lactose free nutrition (BOOST) LIQD Take 237 mLs by mouth 3 (three) times daily between meals.    Marland Kitchen levothyroxine (SYNTHROID, LEVOTHROID) 88 MCG tablet Take 88 mcg by mouth daily before breakfast.    . losartan (COZAAR) 50 MG tablet Take 0.5 tablets (25 mg total) by mouth daily. 30 tablet 6  . saccharomyces boulardii (FLORASTOR) 250 MG capsule Take 1 capsule (250 mg total) by mouth 2 (two) times daily.    . vancomycin (VANCOCIN) 50 mg/mL oral solution Take 2.5 mLs (125 mg total) by mouth 4 (four) times daily. Take for 12 days then stop. (Patient not taking: Reported on 04/03/2015) 120 mL 0  . warfarin (COUMADIN) 1 MG tablet Take 1 tablet (1 mg total) by mouth daily. Take until seen in coumadin clinic. 30 tablet 0   No current facility-administered medications for this visit.    Functional Status:  In your present state of health, do you have any difficulty performing the following activities: 04/03/2015 03/12/2015  Hearing? N N  Vision? Y Y  Difficulty concentrating or making decisions? [name]  Walking or climbing stairs? Y Y  Dressing  or bathing? Y Y  Doing errands, shopping? [name]  Preparing Food and eating ? Y Y  Using the Toilet? N Y  In the past six months, have you accidently leaked urine? N N  Do you have problems with loss of bowel control? N N  Managing your Medications? Y Y  Managing your Finances? [name]  Housekeeping or managing your Housekeeping? [name]    Fall/Depression Screening:  PHQ 2/9 Scores 04/03/2015 03/12/2015  PHQ - 2 Score 2 2  PHQ- 9 Score 6 6    Assessment:   CSW called phone number of [name], step daughter of client, on 04/18/15.  CSW informed [name] that CSW had called Myrla Halsted at Department of Social Services on 04/18/15 and talked with Misty Stanley about [place] eligibility for client.  CSW informed [name] that Myrla Halsted had reported to CSW on 04/18/15 that client earned too much monthly income at present to qualify for Medicaid benefit for client.  CSW informed [name] that CSW would call Yolande Jolly at Department of Social Services next week to talk more with her about food stamps eligibility for client.  [name] was at the home of client during call; she transferred phone to [facility] and CSW informed [place] on 04/18/15 that per information by Myrla Halsted at Department of Social Services, Yancey earned too much to qualify for  Medicaid at present.  CSW informed Rashiem that CSW would talk further with Department of Social Services regarding client's eligibility for food stamps benefits.  Client said that his step daughter had brought him grocery items today and he had food items at present.  CSW informed Mance that CSW would call client next Monday to assess needs of client at that time.  Plan: CSW to call client next Monday to assess needs of client at that time. Client to cooperate with in home care providers through [facility]. CSW to collaborate with RN [name] to monitor needs of client.   Kelton Pillar.Tiffeny Minchew MSW, LCSW Licensed Clinical Social Worker [facility] Care Management [phone]  Plan:

## 2015-04-18 NOTE — Patient Outreach (Addendum)
04/17/15- Telephone call received from home health nurse Particia Jasper RN requesting update on CSW issues as far as food stamp, medicaid application and stating pt may now want to move into some type of apartment and is not going to allow his wife to come back home. RN CM sent In Basket to [facility] CSW Lorin Picket Forrest asking him to call Particia Jasper RN with [facility] for collaboration about these issues. 04/18/15- telephone call for transition of care week 3, unable to speak with pt, could only hear loud, scratchy static.  Will attempt again at later date.  [name] [facility], BSN [facility] Community Care Coordinator [phone]

## 2015-04-21 ENCOUNTER — Other Ambulatory Visit: Payer: Self-pay | Admitting: Licensed Clinical Social Worker

## 2015-04-21 NOTE — Patient Outreach (Signed)
Assessment:  CSW called [facility] Department of Social Services on 04/21/15 and left phone message for Charlann Boxer, caseworker, to please return call to CSW at [phone] to discuss food stamps application process/criteria related to client eligibility for food stamps.  Client has reported that he is residing alone at home.  His wife, who had broken her hip in past weeks, continues to reside for care support at the home of her daughter in [place], [place].  Sullivan and CSW have talked recently regarding the fact that he earns too much each month to qualify for Medicaid support.  Camerin has been receiving some weekly food supplies from his step daughter who visits him as she is able. However, [name], stepdaughter of client, has reported to CSW that she is very busy caring for her mother (spouse of client) in [place].  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  Client has been working with [facility] nurse for in home nursing support as scheduled.  CSW has encouraged client to call local food pantries to inquire about food support; but , to this point, client has not as yet called a local food pantry to inquire about food support. CSW called client home phone number on 04/21/15. CSW verified identity of client.  Jossiel stated that he had his prescribed medications.  He said he was still challenged with food issues. CSW encouraged Tykwan to call at least one local food pantry to inquire about food support. CSW has given Romeo Apple the phone number of [facility] at [phone].  CSW said his step daughter [name], had brought him some food items recently.  [place] and CSW spoke of fact that he earned too much to qualify for Medicaid assistance.  CSW informed Johnpaul that CSW is awaiting return call from caseworker at Department of Social Services regarding food stamp eligibility for client.  CSW informed Ashrith that CSW would  call [facility] on 04/21/15 to inquire about current nursing involvement of [facility] with client.  Client and CSW spoke of housing options for client. Client said he did not think his wife would be returning to live at their residence in [place], [place]. He said she had numerous care issues at present and needed to stay with her daughter for care support.  CSW spoke with Romeo Apple about assisted living facility options. Sasan also spoke of possibly seeking small apartment to live in independently.  CSW reminded Bexley that if he moved from his current home that he would have to most likely private pay for his residence and all expenses.  His income is too high per month for Medicaid benefit.  However, client still has financial challenges he faces each month. Client was appreciative of phone call from CSW on 04/21/15.  Plan: CSW to communicate via phone with Charlann Boxer, caseworker at Department of Social Services, regarding food stamps eligibility of client. CSW to collaborate with RN [name] in monitoring needs of client. CSW to call client later this week to assess needs of client. CSW to call [facility] on 04/21/15 to inquire about current nursing involvement of that agency with client.  Kelton Pillar.Tu Bayle MSW, LCSW Licensed Clinical Social Worker [facility] Care Management [phone]

## 2015-04-21 NOTE — Patient Outreach (Signed)
Assessment: CSW called [facility] [phone]) on 04/21/15 and spoke with agency representative on 04/21/15 about in home care nursing support for client through that agency. Agency representative said that client continued to receive in home nursing support as scheduled through [facility].  Agency representative said that she would contact RN assisgned (Angie) and ask Angie to return call to CSW at [phone] to discuss current needs of client.  CSW later spoke on 04/21/15 with Angie, RN from [facility]. She said she probably would work with client for in home nursing support through [facility] for at least another month. She asked if RN [name] could inquire about digital scale for client since client has challenges reading numbers on scale.  CSW informed Angie of fact that client earned too much to qualify for Medicaid benefit. CSW informed Angie that CSW was waiting for return call from Charlann Boxer, caseworker at Department of Social Services, to discuss food stamps eligibility for client.  Angie and CSW spoke of family issues/challenges for client.  CSW informed Angie that CSW and client had spoken on 04/21/15 about housing options for client in the area.  CSW thanked Angie for phone conversation on 04/21/15 to discuss needs of client.   Plan: Client to take medications as prescribed and to attend scheduled medical appointments. Client to cooperate with Angie, RN assigned from [facility] to provide scheduled in home nursing support for client. CSW to inform RN [name] of above information on 04/21/15.  Kelton Pillar.Noel Henandez MSW, LCSW Licensed Clinical Social Worker [facility] Care Management [phone]

## 2015-04-24 ENCOUNTER — Encounter: Payer: Self-pay | Admitting: *Deleted

## 2015-04-24 ENCOUNTER — Other Ambulatory Visit: Payer: Self-pay | Admitting: *Deleted

## 2015-04-24 ENCOUNTER — Other Ambulatory Visit: Payer: Self-pay | Admitting: Licensed Clinical Social Worker

## 2015-04-24 DIAGNOSIS — I5022 Chronic systolic (congestive) heart failure: Secondary | ICD-10-CM | POA: Diagnosis not present

## 2015-04-24 DIAGNOSIS — E119 Type 2 diabetes mellitus without complications: Secondary | ICD-10-CM | POA: Diagnosis not present

## 2015-04-24 DIAGNOSIS — I482 Chronic atrial fibrillation: Secondary | ICD-10-CM | POA: Diagnosis not present

## 2015-04-24 DIAGNOSIS — I255 Ischemic cardiomyopathy: Secondary | ICD-10-CM | POA: Diagnosis not present

## 2015-04-24 DIAGNOSIS — E441 Mild protein-calorie malnutrition: Secondary | ICD-10-CM | POA: Diagnosis not present

## 2015-04-24 DIAGNOSIS — Z950 Presence of cardiac pacemaker: Secondary | ICD-10-CM | POA: Diagnosis not present

## 2015-04-24 NOTE — Patient Outreach (Signed)
Assessment: CSW received call from RN [name] on 04/24/15.  Raynelle Fanning reported to CSW that client's phone was not working.  Client told Raynelle Fanning that he thought phone company had serviced phone recently; however, Raynelle Fanning called number of client on 04/24/15 and phone in client's home did not ring, did not work.  CSW called phone number for [name], step daughter of client, on 04/24/15.  CSW left phone message for [name] and informed her in message that client phone was out of order and asked if she could call phone company client used to see if that company could repair phone or repair phone services for client.  CSW gave [name] CSW return number of [phone]. [name], RN completed home visit with client on 04/24/15. RN said that client was interested in [address] in [place], [place].  CSW called [facility] on 04/24/15.  Representative of that apartment complex said that currently there were no available apartment openings at [employer].  CSW spoke with representative about application for occupancy at apartment complex. CSW will communicate information about [facility] apartments to client when client again has phone services available.  Again, RN [name] made home visit with client on 04/24/15.  She and client spoke of food resources for client.  Client said he would like to obtain boost nutritional supplement if able to do so.  CSW and RN spoke of boost supplement for client and are considering resources available to help with purchase of boost supplement for client.  Plan: Client to take medications as prescribed and to attend scheduled medical appointments. CSW to communicate with client in two weeks to assess needs of client at that time. CSW and RN to collaborate in monitoring needs of client.  Kelton Pillar.Aram Domzalski MSW, LCSW Licensed Clinical Social Worker [facility] Care Management [phone]

## 2015-04-24 NOTE — Patient Outreach (Signed)
[facility]) Care Management   04/24/2015  Carrel Leather Halperin [birth year] [id number]  [name] is an 80 y.o. male  Subjective: Routine home visit with pt, HIPAA verified, pt states " I'm about ready to move from here, my wife's not coming back and I can't keep this place up" states " I'm out of my blood thinner and have been for 3 days, have no money to get it"  Pt states has had not received anything in the mail yet regarding "extra help" for medicine but will call [facility] office when he does. Pt states he would like digital scale as this would be easier to read (RN CM will bring one out, there were no scales available at [facility] office), pt states he would "like some Boost to drink" as RN CM will be getting gift card and offered to buy some groceries and pt would rather have Boost. Pt states he feels overwhelmed and cannot cope very well with his health conditions, medications and other aspects of his care, he wishes to move to an apartment and would consider assisted living as well.  Objective:  Filed Vitals:   04/24/15 1204  BP: 126/60  Pulse: 88  Resp: 16  Weight: 179 lb (81.194 kg)  SpO2: 96%  CBG in low 100's range per pt.  ROS  Physical Exam  Constitutional: He is oriented to person, place, and time. He appears well-developed.  Neck: Normal range of motion.  Cardiovascular: Normal rate.   Respiratory: Effort normal and breath sounds normal.  GI: Soft. Bowel sounds are normal.  Musculoskeletal: Normal range of motion. He exhibits no edema.  Neurological: He is alert and oriented to person, place, and time.  Skin: Skin is warm and dry.  Psychiatric: He has a normal mood and affect. His behavior is normal. Thought content normal.  Pt has anxiety that varies from day to day.    Current Medications:   Current Outpatient Prescriptions  Medication Sig Dispense Refill  . amiodarone (PACERONE) 200 MG tablet Take 0.5 tablets (100 mg total) by mouth daily. 15 tablet 6  .  atorvastatin (LIPITOR) 20 MG tablet Take 1 tablet (20 mg total) by mouth daily at 6 PM.    . carvedilol (COREG) 3.125 MG tablet Take 1 tablet (3.125 mg total) by mouth 2 (two) times daily with a meal.    . finasteride (PROSCAR) 5 MG tablet Take 5 mg by mouth daily.     . furosemide (LASIX) 40 MG tablet Take 1 tablet (40 mg total) by mouth daily. 30 tablet   . lactose free nutrition (BOOST) LIQD Take 237 mLs by mouth 3 (three) times daily between meals.    Marland Kitchen levothyroxine (SYNTHROID, LEVOTHROID) 88 MCG tablet Take 88 mcg by mouth daily before breakfast.    . losartan (COZAAR) 50 MG tablet Take 0.5 tablets (25 mg total) by mouth daily. 30 tablet 6  . saccharomyces boulardii (FLORASTOR) 250 MG capsule Take 1 capsule (250 mg total) by mouth 2 (two) times daily.    Marland Kitchen warfarin (COUMADIN) 1 MG tablet Take 1 tablet (1 mg total) by mouth daily. Take until seen in coumadin clinic. 30 tablet 0  . ALPRAZolam (XANAX) 0.5 MG tablet Take 1 tablet (0.5 mg total) by mouth 3 (three) times daily as needed for anxiety. (Patient not taking: Reported on 04/10/2015) 9 tablet 0  . vancomycin (VANCOCIN) 50 mg/mL oral solution Take 2.5 mLs (125 mg total) by mouth 4 (four) times daily. Take for 12  days then stop. (Patient not taking: Reported on 04/03/2015) 120 mL 0   No current facility-administered medications for this visit.    Functional Status:   In your present state of health, do you have any difficulty performing the following activities: 04/03/2015 03/12/2015  Hearing? N N  Vision? Y Y  Difficulty concentrating or making decisions? Tempie Donning  Walking or climbing stairs? Y Y  Dressing or bathing? Y Y  Doing errands, shopping? Tempie Donning  Preparing Food and eating ? Y Y  Using the Toilet? N Y  In the past six months, have you accidently leaked urine? N N  Do you have problems with loss of bowel control? N N  Managing your Medications? Y Y  Managing your Finances? Tempie Donning  Housekeeping or managing your Housekeeping? Tempie Donning     Fall/Depression Screening:    PHQ 2/9 Scores 04/03/2015 03/12/2015  PHQ - 2 Score 2 2  PHQ- 9 Score 6 6   Fall Risk  04/24/2015 04/03/2015 03/12/2015  Falls in the past year? Yes Yes Yes  Number falls in past yr: 2 or more 2 or more 2 or more  Injury with Fall? No No No  Risk Factor Category  High Fall Risk High Fall Risk High Fall Risk  Risk for fall due to : History of fall(s);Impaired balance/gait History of fall(s);Impaired balance/gait;Medication side effect History of fall(s)  Follow up Education provided;Falls prevention discussed Falls evaluation completed;Education provided;Falls prevention discussed Falls prevention discussed    Assessment:  See Care plan, called and faxed letter to Dr. Silvestre Mesi office, reported pt out of warfarin and [facility] refilling today and that pt is having difficulty managing warfarin in the home and will most likely have ongoing issues long term due to poor education and difficulty understanding medication regime, also reported pt fell on Monday and no visible injury noted, pt has soreness to left side.  RN CM collaborated with home health RN and [facility] CSW regarding the many socioeconomic issues in the home and minimal family support.  [facility] CM Care Plan Problem One        Patient Outreach from 04/24/2015 in [place] Problem One  pt having difficulty managing health conditions at home due to poor education, lack of family support, anxiety.   Care Plan for Problem One  Active   THN CM Short Term Goal #1 (0-30 days)  pt will participate and cooperate with plan of care, working with RN CM, home health and CSW within 30 days.   THN CM Short Term Goal #1 Start Date  04/24/15   Interventions for Short Term Goal #1  RN CM ask pt if he would like digital scale and pt does [facility] office out of scales, they are ordered and RN CM will bring to pt), RN CM offered to bring groceries for pt and pt wants Boost instead citing this would be easier and he  likes to drink this daily,  RN CM collaborated with [facility] CSW and informed that pt wants to move from his home into an apartment and would even consider assisted living as pt cannot manage his home, RN CM collaborated with home health [place] and reported [facility] interventions, reported to A. Williams RN and Dr. Silvestre Mesi office (left voicemail for [place]), that pt fell on Monday, has soreness to left side, no injury visible and instructed pt to see MD if needed or go to ED if needed, also reported pt has  not taken coumadin for 3 days as he is out of, RN CM called [facility], spoke with Lollie Marrow, they are doing emergency refill and will give pt the medication for free as pt has no money to pay today, pt to pickup today.  reported to pharmacy, pt also out of atorvastatin, they wil fill this as well.    [place] Plan Problem Two        Patient Outreach Telephone from 04/21/2015 in [place]   Interventions for Short Term Goal #2   CSW encouraged client on 04/21/15 to contact at least one local food pantry to access food support/assistance for client    [place] Problem Three        Patient Outreach from 04/24/2015 in [place] Problem Three  Knowledge deficit related to congestive heart failure   Care Plan for Problem Three  Active   THN Long Term Goal (31-90) days  pt will have no readmissions related to heart failure within 90 days.   THN Long Term Goal Start Date  04/03/15   Interventions for Problem Three Long Term Goal  RN CM reinforced CHF zones, praised and encouragd pt for weighing daily   THN CM Short Term Goal #1 (0-30 days)  pt will weigh daily and record within 30 days.   THN CM Short Term Goal #1 Start Date  04/03/15   [facility] CM Short Term Goal #1 Met Date  04/24/15 [pt is weighing daily]   Interventions for Short Term Goal #1  RN CM reinforced importance of daily weights and calling early for weight gain, change in symptoms, health status,  reviewed resources to call (home health, MD RN CM, 911)      Plan:follow up with home visit 05/14/15 Continue CHF teaching, assess weight, take scale when available, take Boost to pt Collaborate with home health and [facility] CSW on progress of apartments for pt or assisted living    Jacqlyn Larsen [facility], [place] [phone]

## 2015-04-29 ENCOUNTER — Other Ambulatory Visit: Payer: Self-pay | Admitting: Licensed Clinical Social Worker

## 2015-04-29 NOTE — Patient Outreach (Signed)
[facility]) Care Management  [facility] Social Work  04/29/2015  CALE BETHARD 1935/06/16 [id number]  Subjective:    Objective:   Current Medications:  Current Outpatient Prescriptions  Medication Sig Dispense Refill  . ALPRAZolam (XANAX) 0.5 MG tablet Take 1 tablet (0.5 mg total) by mouth 3 (three) times daily as needed for anxiety. (Patient not taking: Reported on 04/10/2015) 9 tablet 0  . amiodarone (PACERONE) 200 MG tablet Take 0.5 tablets (100 mg total) by mouth daily. 15 tablet 6  . atorvastatin (LIPITOR) 20 MG tablet Take 1 tablet (20 mg total) by mouth daily at 6 PM.    . carvedilol (COREG) 3.125 MG tablet Take 1 tablet (3.125 mg total) by mouth 2 (two) times daily with a meal.    . finasteride (PROSCAR) 5 MG tablet Take 5 mg by mouth daily.     . furosemide (LASIX) 40 MG tablet Take 1 tablet (40 mg total) by mouth daily. 30 tablet   . lactose free nutrition (BOOST) LIQD Take 237 mLs by mouth 3 (three) times daily between meals.    Marland Kitchen levothyroxine (SYNTHROID, LEVOTHROID) 88 MCG tablet Take 88 mcg by mouth daily before breakfast.    . losartan (COZAAR) 50 MG tablet Take 0.5 tablets (25 mg total) by mouth daily. 30 tablet 6  . saccharomyces boulardii (FLORASTOR) 250 MG capsule Take 1 capsule (250 mg total) by mouth 2 (two) times daily.    . vancomycin (VANCOCIN) 50 mg/mL oral solution Take 2.5 mLs (125 mg total) by mouth 4 (four) times daily. Take for 12 days then stop. (Patient not taking: Reported on 04/03/2015) 120 mL 0  . warfarin (COUMADIN) 1 MG tablet Take 1 tablet (1 mg total) by mouth daily. Take until seen in coumadin clinic. 30 tablet 0   No current facility-administered medications for this visit.    Functional Status:  In your present state of health, do you have any difficulty performing the following activities: 04/03/2015 03/12/2015  Hearing? N N  Vision? Y Y  Difficulty concentrating or making decisions? Tempie Donning  Walking or climbing stairs? Y Y  Dressing  or bathing? Y Y  Doing errands, shopping? Tempie Donning  Preparing Food and eating ? Y Y  Using the Toilet? N Y  In the past six months, have you accidently leaked urine? N N  Do you have problems with loss of bowel control? N N  Managing your Medications? Y Y  Managing your Finances? Tempie Donning  Housekeeping or managing your Housekeeping? Tempie Donning    Fall/Depression Screening:  PHQ 2/9 Scores 04/03/2015 03/12/2015  PHQ - 2 Score 2 2  PHQ- 9 Score 6 6    Assessment:   CSW met with client at home of clinet on 04/29/15 for a routine home visit.  Jeneen Rinks, step daughter of client and Shabazz Mckey wife of client also attended meeting with client at home on 04/29/15.  CSW had obtained two [place] cards ($25.00 each) for client use. Client had requested gift cards be used to purchase Boost supplement for client.  CSW took two [facility] gift cards ($25.00) and purchased 3 boxes of Boost supplemnt for client  CSW took gift card with a  remaining balance on it to client and  gave client gift card with remaining balance on 04/29/15. CSW gave client 3 boxes of Boost purchased with [facility] gift cards on 04/29/15.   Client said he had fallen recently and that his rib on  left side is sore.  He said he did not go to doctor.  But he is taking medications as prescribed.  Client said he will wait until he gets paid for this month (on tomorrow) and will try to get prescribed medication refills needed at that time.  CSW talked with group about fact that client earned too much to qualify for Medicaid in community.  Also client earned too much to qualify for Medicaid assistance in [place] facility.  CSW and group talked about fact that client could complete Food Stamps application and mail it in to seek qualification for Food Stamps benefit.  CSW and client worked to Office manager for client on 04/29/15. CSW took completed Food Stamp application for client to local post office and mailed  food stamp application for client  on 04/29/15.  CSW thanked client for visit on 04/29/15   Plan:  Client to take medications as prescribed and attend scheduled medical appointments. Client to communicate as needed with RN Jacqlyn Larsen to discuss nursing needs of client. CSW to contact client in two weeks to assess needs of client at that time.  [name].Becci Batty MSW, LCSW Licensed Clinical Social Worker [facility] Care Management [phone]

## 2015-05-01 ENCOUNTER — Other Ambulatory Visit: Payer: Self-pay | Admitting: *Deleted

## 2015-05-01 DIAGNOSIS — E119 Type 2 diabetes mellitus without complications: Secondary | ICD-10-CM | POA: Diagnosis not present

## 2015-05-01 DIAGNOSIS — Z950 Presence of cardiac pacemaker: Secondary | ICD-10-CM | POA: Diagnosis not present

## 2015-05-01 DIAGNOSIS — I255 Ischemic cardiomyopathy: Secondary | ICD-10-CM | POA: Diagnosis not present

## 2015-05-01 DIAGNOSIS — E441 Mild protein-calorie malnutrition: Secondary | ICD-10-CM | POA: Diagnosis not present

## 2015-05-01 DIAGNOSIS — I482 Chronic atrial fibrillation: Secondary | ICD-10-CM | POA: Diagnosis not present

## 2015-05-01 DIAGNOSIS — I5022 Chronic systolic (congestive) heart failure: Secondary | ICD-10-CM | POA: Diagnosis not present

## 2015-05-01 NOTE — Patient Outreach (Signed)
05/01/15-  Telephone call from Particia Jasper RN with [facility] reporting that she had difficulty getting pt to the door today, went by pt house several times and was finally able to get in touch with patient. Angie RN says pt is still having difficulty managing at home, pt seems not to care or be interested in his self care and has now decided he does not want to move to another location.  Angie RN continues to check PT/ INR and assisting with medication management and disease process.  RN CM called CSW Lorna Few and updated on the above information.  [name] [facility], BSN [facility] Community Care Coordinator [phone]

## 2015-05-02 ENCOUNTER — Other Ambulatory Visit: Payer: Self-pay | Admitting: Licensed Clinical Social Worker

## 2015-05-02 NOTE — Patient Outreach (Signed)
Assessment:  CSW and RN [name] discussed ongoing needs of client as well as current living situation of client. RN [name] informed CSW that [facility] RN was concerned about client's ability to understand his current medications and administration of these current medications.  Client continues to live alone and has ongoing food procurement issues, has no way to travel on his own away from home, and is a fall risk. Client's home is cluttered and demonstrates a hoarding behavior pattern; client has informed CSW that wife of client has hoarding behaviors. RN [name] and CSW agreed for CSW to contact Adult Protective Services to inform agency of concerns regarding client's living environment and client's ability to care for himself. CSW called Adult Protective Services on 05/02/15 and spoke with representative of Adult Protective Services Rosalyn Gess) regarding needs of client. CSW reported to Adult Protective Services nursing concerns of [facility] Nurse and nursing concerns of [facility] RN [name].  CSW described living situation of client to [place] and gave Rosalyn Gess the name,  address, phone number of client and gave Rosalyn Gess the family contact name and number for client .  CSW informed Rosalyn Gess that client was not taking his medications as prescribed, had experienced a recent fall, had decreased transport assistance, had limited family support and had ongoing food procurement issues.  Rosalyn Gess was informed that client is not able to understand well his plan of care and has difficulty obtaining his prescribed medications.  CSW gave Rosalyn Gess the [facility] office address in order that APS could communicate via mail with [facility] program regarding their involvement with needs of client. CSW thanked Rosalyn Gess for his assistance; CSW gave Rosalyn Gess CSW [facility] number of [phone] and invited Rosalyn Gess to call CSW as needed to discuss needs of client.   Plan: Client to take medications as prescribed and  attend scheduled medical appointments. CSW to inform RN [name] of above information on 05/02/15 CSW to call client next week to assess needs of client  Kelton Pillar.Roddrick Sharron MSW, LCSW Licensed Clinical Social Worker [facility] Care Management [phone]

## 2015-05-05 NOTE — Patient Outreach (Signed)
[facility]) Care Management  05/05/2015  TC ALFARO 1934/11/13 [id number]   Telephone outreach to patient daughter to check the status of the extra help application with social security. I had to leave a HIPPA compliant message for her to call me back.  Duwan Adrian L. Braydn Carneiro, AAS [facility] Care Management Assistant

## 2015-05-07 DIAGNOSIS — I255 Ischemic cardiomyopathy: Secondary | ICD-10-CM | POA: Diagnosis not present

## 2015-05-07 DIAGNOSIS — E119 Type 2 diabetes mellitus without complications: Secondary | ICD-10-CM | POA: Diagnosis not present

## 2015-05-07 DIAGNOSIS — I5022 Chronic systolic (congestive) heart failure: Secondary | ICD-10-CM | POA: Diagnosis not present

## 2015-05-07 DIAGNOSIS — E441 Mild protein-calorie malnutrition: Secondary | ICD-10-CM | POA: Diagnosis not present

## 2015-05-07 DIAGNOSIS — Z950 Presence of cardiac pacemaker: Secondary | ICD-10-CM | POA: Diagnosis not present

## 2015-05-07 DIAGNOSIS — I482 Chronic atrial fibrillation: Secondary | ICD-10-CM | POA: Diagnosis not present

## 2015-05-14 ENCOUNTER — Ambulatory Visit: Payer: PRIVATE HEALTH INSURANCE | Admitting: *Deleted

## 2015-05-14 ENCOUNTER — Encounter: Payer: Self-pay | Admitting: *Deleted

## 2015-05-14 ENCOUNTER — Other Ambulatory Visit: Payer: Self-pay | Admitting: *Deleted

## 2015-05-14 DIAGNOSIS — E441 Mild protein-calorie malnutrition: Secondary | ICD-10-CM | POA: Diagnosis not present

## 2015-05-14 DIAGNOSIS — Z950 Presence of cardiac pacemaker: Secondary | ICD-10-CM | POA: Diagnosis not present

## 2015-05-14 DIAGNOSIS — I482 Chronic atrial fibrillation: Secondary | ICD-10-CM | POA: Diagnosis not present

## 2015-05-14 DIAGNOSIS — E119 Type 2 diabetes mellitus without complications: Secondary | ICD-10-CM | POA: Diagnosis not present

## 2015-05-14 DIAGNOSIS — I255 Ischemic cardiomyopathy: Secondary | ICD-10-CM | POA: Diagnosis not present

## 2015-05-14 DIAGNOSIS — I5022 Chronic systolic (congestive) heart failure: Secondary | ICD-10-CM | POA: Diagnosis not present

## 2015-05-14 NOTE — Patient Outreach (Signed)
05/14/15- RN CM drove to patient's home for scheduled home visit, no answer to locked door and no answer to telephone, no option to leave voicemail.  RN CM called Particia Jasper RN and she has discharged pt as of today, pt has been set up to have protime/ INR checked at MD office, Angie RN says no further needs she can impact as pt is uninterested in working towards goals.  RN CM called [facility] CSW Lorna Few and informed him of the above information,  RN CM mailed letter to patient's home and will keep case open for 10 days.  [name] [facility], BSN [facility] Community Care Coordinator [phone]

## 2015-05-19 ENCOUNTER — Encounter: Payer: Self-pay | Admitting: Licensed Clinical Social Worker

## 2015-05-19 NOTE — Patient Outreach (Signed)
[facility]) Care Management  [facility] Social Work  05/19/2015  [name] [birth year] [id number]  Subjective:    Objective:   Current Medications:  Current Outpatient Prescriptions  Medication Sig Dispense Refill  . ALPRAZolam (XANAX) 0.5 MG tablet Take 1 tablet (0.5 mg total) by mouth 3 (three) times daily as needed for anxiety. (Patient not taking: Reported on 04/10/2015) 9 tablet 0  . amiodarone (PACERONE) 200 MG tablet Take 0.5 tablets (100 mg total) by mouth daily. 15 tablet 6  . atorvastatin (LIPITOR) 20 MG tablet Take 1 tablet (20 mg total) by mouth daily at 6 PM.    . carvedilol (COREG) 3.125 MG tablet Take 1 tablet (3.125 mg total) by mouth 2 (two) times daily with a meal.    . finasteride (PROSCAR) 5 MG tablet Take 5 mg by mouth daily.     . furosemide (LASIX) 40 MG tablet Take 1 tablet (40 mg total) by mouth daily. 30 tablet   . lactose free nutrition (BOOST) LIQD Take 237 mLs by mouth 3 (three) times daily between meals.    Marland Kitchen levothyroxine (SYNTHROID, LEVOTHROID) 88 MCG tablet Take 88 mcg by mouth daily before breakfast.    . losartan (COZAAR) 50 MG tablet Take 0.5 tablets (25 mg total) by mouth daily. 30 tablet 6  . saccharomyces boulardii (FLORASTOR) 250 MG capsule Take 1 capsule (250 mg total) by mouth 2 (two) times daily.    . vancomycin (VANCOCIN) 50 mg/mL oral solution Take 2.5 mLs (125 mg total) by mouth 4 (four) times daily. Take for 12 days then stop. (Patient not taking: Reported on 04/03/2015) 120 mL 0  . warfarin (COUMADIN) 1 MG tablet Take 1 tablet (1 mg total) by mouth daily. Take until seen in coumadin clinic. 30 tablet 0   No current facility-administered medications for this visit.    Functional Status:  In your present state of health, do you have any difficulty performing the following activities: 04/03/2015 03/12/2015  Hearing? N N  Vision? Y Y  Difficulty concentrating or making decisions? [name]  Walking or climbing stairs? Y Y  Dressing  or bathing? Y Y  Doing errands, shopping? [name]  Preparing Food and eating ? Y Y  Using the Toilet? N Y  In the past six months, have you accidently leaked urine? N N  Do you have problems with loss of bowel control? N N  Managing your Medications? Y Y  Managing your Finances? [name]  Housekeeping or managing your Housekeeping? [name]    Fall/Depression Screening:  PHQ 2/9 Scores 04/03/2015 03/12/2015  PHQ - 2 Score 2 2  PHQ- 9 Score 6 6    Assessment:  CSW completed chart review on client on 05/19/15.  CSW communicated with RN [name] on 05/19/15 about discharge status of client.  RN [name] had informed CSW that Angie, RN with [facility] had discharged client from [facility] services on 05/14/15.  Angie had said she did not have any further areas of nursing she could impact with client since client was uninterested in working towards nursing goals set.  RN [name] tried to visit client last week but client would not answer knock to door nor would client answer phone calls.  Client often stays in bedroom of home, watching TV and sometimes is selective on who he answers door for or talks to on phone. Likewise, on CSW last home visit with client, CSW had difficulty getting client to  respond to knock to door by CSW.  RN [name] sent client letter last week informing client that [facility] services would be closed for client in 10 days if RN did not receive communication from client. As of this date, RN has not received any communication from client regarding [facility] program participation or continuation.  Likewise, client has also not been working with CSW in working towards goal of food procurement or Lawyer.  CSW has talked with client several times about his contacting food pantry nearby for food support. Client has not made any effort to communicate with local food pantry for support. Client has not been willing to work towards care plan goal of food procurement  set with client with help by CSW.  CSW has helped client to apply for food stamps benefit.  Food stamps application for client was mailed in to Department of Social Services [place], [place]) by CSW several weeks ago. Client is waiting to hear from Department of Social Services regarding food stamps application status for client.  [facility] was also contacted by CSW regarding client status/needs.  CSW has not received a written response from [facility]PS nor a phone call from APS regarding client. However, RN [name] informed CSW recently that APS representative had contacted Karoline Caldwell, RN with [facility], regarding client.  Per RN [name], Angie was informed by APS representative that APS did not have any recommendations for client case at present. Angie RN understood that APS would not have any further involvement with client case at present.  Due to client's decreased willingness to communicate with CSW and since client has not been willing to work towards his care plan goals set with CSW, CSW is discharging [name] on 05/19/15.  CSW discussed with RN [name] on 05/19/15, CSW plan to discharge client; RN [name] agreed with CSW plan.  Plan: CSW is discharging [name] from [facility] CSW services only on 05/19/15 due to client unwillingness to communicate with CSW and client unwillingness to work towards care plan goals set with client. CSW to communicate with Nena Polio on 05/19/15 to inform Misty Stanley that CSW discharged client on 05/19/15.  CSW also to inform Nena Polio that RN [name] is continuing to provide [facility] nursing services to client at present.  Kelton Pillar.Wilma Wuthrich MSW, LCSW Licensed Clinical Social Worker [facility] Care Management [phone]

## 2015-05-22 ENCOUNTER — Encounter: Payer: PRIVATE HEALTH INSURANCE | Admitting: Internal Medicine

## 2015-05-22 ENCOUNTER — Ambulatory Visit (INDEPENDENT_AMBULATORY_CARE_PROVIDER_SITE_OTHER): Payer: Medicare Other | Admitting: *Deleted

## 2015-05-22 DIAGNOSIS — Z5181 Encounter for therapeutic drug level monitoring: Secondary | ICD-10-CM

## 2015-05-22 DIAGNOSIS — I4891 Unspecified atrial fibrillation: Secondary | ICD-10-CM

## 2015-05-22 DIAGNOSIS — Z7901 Long term (current) use of anticoagulants: Secondary | ICD-10-CM

## 2015-05-22 LAB — POCT INR: INR: 1.7

## 2015-05-26 ENCOUNTER — Other Ambulatory Visit: Payer: Self-pay

## 2015-05-26 ENCOUNTER — Encounter: Payer: Self-pay | Admitting: *Deleted

## 2015-05-26 NOTE — Patient Outreach (Signed)
I reviewed Mr. Dethomas medications to see if any were available through patient assistance programs.  He is on generic medications for all of his medications.  He would have to be converted to brand name medications to possibly qualify.   I checked Medicare.gov to determine the price of his medications.  He pays a $7 or less co-pay for each of his medications. Due to him not responding to nursing care management, his case is going to be closed.     Steve Rattler, PharmD, [employer] [facility] consultant [phone]

## 2015-05-26 NOTE — Patient Outreach (Signed)
[facility]) Care Management  05/26/2015  [name] March 15, 1935 [id number]   Spoke with patient daughter-in-law and she informed me that social security sent them a decision letter stating patient was denied for extra help. No further action is needed from care management assistant. I am closing out his case.  Tashonna Descoteaux L. Finesse Fielder, AAS [facility] Care Management Assistant

## 2015-05-29 ENCOUNTER — Other Ambulatory Visit: Payer: Self-pay | Admitting: *Deleted

## 2015-05-29 ENCOUNTER — Encounter: Payer: Self-pay | Admitting: *Deleted

## 2015-05-29 NOTE — Patient Outreach (Signed)
05/28/15- pt called and said he did receive letter that [facility] staff had been trying to reach him by telephone and also came by his home but no one came to door.  Pt says he has no other goals he wants to work towards except "someone to get me some food and grocery shop for me"  Pt verbalizes and aware he did not qualify for "extra help" with medications and is still awaiting decision about food stamps.  Pt is choosing to continue to live in his same environment and not willing to make any changes at present, Adult protective services did see pt in the home but no changes/interventions made. RN CM talked with pt about nutrition center in [place] [facility]" and pt says he would be willing to go there daily for a hot meal.  RN CM mailed pt a letter with information (address, hours, telephone number) and informed the center helps with transportation to the center if needed, although pt does still drive.  Pt appreciative of resource and [facility] services and aware of discharge.  [name] [facility], BSN [facility] Community Care Coordinator [phone]

## 2015-06-03 NOTE — Patient Outreach (Signed)
[facility]) Care Management  06/03/2015  [name] 01-13-1935 [id number]   Notification from [name], RN to close case due to unable to maintain contact with patient for [facility] Care Management services.  Thanks, Corrie Mckusick. Sharlee Blew [facility] Care Management [facility] CM Assistant Phone: [phone] Fax: [phone]

## 2015-06-06 ENCOUNTER — Encounter: Payer: Self-pay | Admitting: Internal Medicine

## 2015-06-06 ENCOUNTER — Ambulatory Visit (INDEPENDENT_AMBULATORY_CARE_PROVIDER_SITE_OTHER): Payer: Medicare Other | Admitting: Internal Medicine

## 2015-06-06 VITALS — BP 120/62 | HR 69 | Ht 70.0 in | Wt 179.0 lb

## 2015-06-06 DIAGNOSIS — I472 Ventricular tachycardia: Secondary | ICD-10-CM | POA: Diagnosis not present

## 2015-06-06 DIAGNOSIS — I4729 Other ventricular tachycardia: Secondary | ICD-10-CM

## 2015-06-06 DIAGNOSIS — I442 Atrioventricular block, complete: Secondary | ICD-10-CM

## 2015-06-06 DIAGNOSIS — R001 Bradycardia, unspecified: Secondary | ICD-10-CM

## 2015-06-06 DIAGNOSIS — I255 Ischemic cardiomyopathy: Secondary | ICD-10-CM

## 2015-06-06 DIAGNOSIS — I4891 Unspecified atrial fibrillation: Secondary | ICD-10-CM | POA: Diagnosis not present

## 2015-06-06 NOTE — Patient Instructions (Signed)
Your physician recommends that you continue on your current medications as directed. Please refer to the Current Medication list given to you today. Device check 09/08/15. Your physician recommends that you schedule a follow-up appointment in:1 year with Dr. Allred. You will receive a reminder letter in the mail in about 10 months reminding you to call and schedule your appointment. If you don't receive this letter, please contact our office. 

## 2015-06-06 NOTE — Progress Notes (Signed)
Electrophysiology Office Note   Date:  06/06/2015   ID:  [name], DOB 01/15/1935, MRN [id number]  PCP:  [name] Kayser, MD   Primary Electrophysiologist: Dr Graciela Husbands  CC: confusion   History of Present Illness: [name] is a 80 y.o. male who presents today for electrophysiology evaluation.   He has done very well since PPM implant.  He was in extremis with near palliative state at time of implant.  Confusion is much improved.  His exercise tolerance has returned to baseline.  His concern today is with chronic resting tremor.   Today, he denies symptoms of palpitations, chest pain, orthopnea, PND, lower extremity edema, claudication, dizziness, presyncope, syncope, bleeding, or neurologic sequela. The patient is tolerating medications without difficulties and is otherwise without complaint today.    Past Medical History  Diagnosis Date  . Ischemic cardiomyopathy     a. prior CRTD implantation with subcutaneous array b. s/p device system extraction 2/2 infection  . VT (ventricular tachycardia)     a. s/p ablation  . Anxiety   . Depression   . Permanent atrial fibrillation   . Hyperlipidemia   . Gynecomastia   . BPH (benign prostatic hypertrophy)   . Diabetes mellitus   . Hypertension   . Degenerative joint disease   . GERD (gastroesophageal reflux disease)   . CAD (coronary artery disease)     a. s/p CABG   Past Surgical History  Procedure Laterality Date  . Cardiac defibrillator removal    . Coronary artery bypass graft  2000    X5  . Cardiac catheterization  12/25/2008    THE LEFT VENTRICLE WAS ENLARGED. THERE IS ANTERIOR APICAL AND INFERIOR APICAL AKINESIA. EF ESTIMATED 20%. NO MITRAL REGURGITATION.  . Cardiac catheterization  12/2008    REPEAT CATH, SHOWED BYPASS GRAFTS WERE PATENT  . US echocardiography  2007    EF 20 to 25%  . Ablation of dysrhythmic focus    . Pars plana vitrectomy  09/24/2011    Procedure: PARS PLANA VITRECTOMY WITH 25 GAUGE;  Surgeon:  Alford Highland Rankin;  Location: [facility] OR;  Service: Ophthalmology;  Laterality: Left;  MEMBRANE PEEL, SILICONE OIL  . Cardioversion N/A 11/20/2012    Procedure: CARDIOVERSION;  Surgeon: Pricilla Riffle, MD;  Location[facility] ENDOSCOPY;  Service: Cardiovascular;  Laterality: N/A;  . Cardioversion N/A 12/28/2012    Procedure: CARDIOVERSION;  Surgeon: Cassell Clement, MD;  Location: [facility] ENDOSCOPY;  Service: Cardiovascular;  Laterality: N/A;  . Cataract extraction, bilateral    . Ep implantable device N/A 02/21/2015    SJM single chamber pacemaker implanted for complete heart block and permanent afib by Dr Johney Frame     Current Outpatient Prescriptions  Medication Sig Dispense Refill  . amiodarone (PACERONE) 200 MG tablet Take 0.5 tablets (100 mg total) by mouth daily. 15 tablet 6  . atorvastatin (LIPITOR) 20 MG tablet Take 1 tablet (20 mg total) by mouth daily at 6 PM.    . carvedilol (COREG) 3.125 MG tablet Take 1 tablet (3.125 mg total) by mouth 2 (two) times daily with a meal.    . finasteride (PROSCAR) 5 MG tablet Take 5 mg by mouth daily.     . furosemide (LASIX) 40 MG tablet Take 1 tablet (40 mg total) by mouth daily. 30 tablet   . lactose free nutrition (BOOST) LIQD Take 237 mLs by mouth 3 (three) times daily between meals.    Marland Kitchen levothyroxine (SYNTHROID, LEVOTHROID) 88 MCG tablet Take 88 mcg by  mouth daily before breakfast.    . losartan (COZAAR) 50 MG tablet Take 0.5 tablets (25 mg total) by mouth daily. 30 tablet 6  . saccharomyces boulardii (FLORASTOR) 250 MG capsule Take 1 capsule (250 mg total) by mouth 2 (two) times daily.    Marland Kitchen warfarin (COUMADIN) 1 MG tablet Take 1 tablet (1 mg total) by mouth daily. Take until seen in coumadin clinic. 30 tablet 0   No current facility-administered medications for this visit.    Allergies:   Review of patient's allergies indicates no known allergies.   Social History:  The patient  reports that he has never smoked. He has never used smokeless tobacco. He reports  that he does not drink alcohol or use illicit drugs.   Family History:  The patient's family history includes Heart disease in an other family member; Heart failure in his father, mother, and sister.    ROS:  Please see the history of present illness.   All other systems are reviewed and negative.    PHYSICAL EXAM: VS:  BP 120/62 mmHg  Pulse 69  Ht 5\' 10"  (1.778 m)  Wt 81.194 kg (179 lb)  BMI 25.68 kg/m2  SpO2 94% , BMI Body mass index is 25.68 kg/(m^2). GEN: Well nourished, well developed, in no acute distress HEENT: normal Neck: no JVD, carotid bruits, or masses Cardiac: RRR; no murmurs, rubs, or gallops,no edema  Respiratory:  clear to auscultation bilaterally, normal work of breathing GI: soft, nontender, nondistended, + BS MS: no deformity or atrophy Skin: warm and dry, R sided device pocket is well healed Neuro:  Strength and sensation are intact, resting upper extremity tremor Psych: euthymic mood, full affect  Device interrogation is reviewed today in detail.  See PaceArt for details.   Recent Labs: 02/16/2015: TSH 0.047* 02/21/2015: Magnesium 2.4 02/24/2015: ALT 23; B Natriuretic Peptide 1936.4*; Hemoglobin 11.1*; Platelets 188 02/27/2015: BUN 20; Creatinine, Ser 1.27*; Potassium 4.4; Sodium 135    Lipid Panel     Component Value Date/Time   CHOL 167 06/18/2013 1226   TRIG 192.0* 06/18/2013 1226   HDL 31.10* 06/18/2013 1226   CHOLHDL 5 06/18/2013 1226   VLDL 38.4 06/18/2013 1226   LDLCALC 98 06/18/2013 1226     Wt Readings from Last 3 Encounters:  06/06/15 81.194 kg (179 lb)  04/24/15 81.194 kg (179 lb)  04/03/15 77.111 kg (170 lb)      Other studies Reviewed: Additional studies/ records that were reviewed today include: Hospital notes and Dr Koren Bound office notes   ASSESSMENT AND PLAN:  1.  Complete heart block Normal pacemaker function See Pace Art report No changes today  2. Permanent afib On coumadin  3. VT Prior h/o VT ICD implanted by Dr  Graciela Husbands and later extracted due to infection Felt to be a poor candidate for ICD at recent ppm implantation Has been on low dose amiodarone for years--> may be contributing to tremors,  Would be reluctant to stop this however PCP to follow LFTs/TFTs   Follow-up: merlin Return in 1 year  Current medicines are reviewed at length with the patient today.   The patient does not have concerns regarding his medicines.  The following changes were made today:  none  Signed, Hillis Range, MD  06/06/2015 9:08 AM     [facility] [address] [place] [address] [phone] (office) [phone] (fax)

## 2015-06-10 ENCOUNTER — Ambulatory Visit (INDEPENDENT_AMBULATORY_CARE_PROVIDER_SITE_OTHER): Payer: Medicare Other | Admitting: *Deleted

## 2015-06-10 DIAGNOSIS — Z7901 Long term (current) use of anticoagulants: Secondary | ICD-10-CM | POA: Diagnosis not present

## 2015-06-10 DIAGNOSIS — Z5181 Encounter for therapeutic drug level monitoring: Secondary | ICD-10-CM

## 2015-06-10 DIAGNOSIS — I4891 Unspecified atrial fibrillation: Secondary | ICD-10-CM | POA: Diagnosis not present

## 2015-06-10 LAB — CUP PACEART INCLINIC DEVICE CHECK
Battery Remaining Longevity: 118.8 mo
Lead Channel Impedance Value: 475 Ohm
Lead Channel Pacing Threshold Amplitude: 0.75 V
Lead Channel Pacing Threshold Amplitude: 0.75 V
Lead Channel Pacing Threshold Pulse Width: 0.4 ms
Lead Channel Pacing Threshold Pulse Width: 0.4 ms
Lead Channel Setting Pacing Amplitude: 2.5 V
Lead Channel Setting Sensing Sensitivity: 2 mV
MDC IDC MSMT BATTERY VOLTAGE: 3.01 V
MDC IDC MSMT LEADCHNL RV SENSING INTR AMPL: 9.5 mV
MDC IDC PG SERIAL: 7699684
MDC IDC SESS DTM: 20160902123625
MDC IDC SET LEADCHNL RV PACING PULSEWIDTH: 0.4 ms
MDC IDC STAT BRADY RV PERCENT PACED: 94 %
Pulse Gen Model: 1240

## 2015-06-10 LAB — POCT INR: INR: 1.5

## 2015-06-24 ENCOUNTER — Ambulatory Visit (INDEPENDENT_AMBULATORY_CARE_PROVIDER_SITE_OTHER): Payer: Medicare Other | Admitting: *Deleted

## 2015-06-24 DIAGNOSIS — I4891 Unspecified atrial fibrillation: Secondary | ICD-10-CM | POA: Diagnosis not present

## 2015-06-24 DIAGNOSIS — Z7901 Long term (current) use of anticoagulants: Secondary | ICD-10-CM | POA: Diagnosis not present

## 2015-06-24 DIAGNOSIS — Z23 Encounter for immunization: Secondary | ICD-10-CM | POA: Diagnosis not present

## 2015-06-24 DIAGNOSIS — Z5181 Encounter for therapeutic drug level monitoring: Secondary | ICD-10-CM | POA: Diagnosis not present

## 2015-06-24 LAB — POCT INR: INR: 3.1

## 2015-06-24 MED ORDER — WARFARIN SODIUM 2.5 MG PO TABS: ORAL_TABLET | ORAL | Status: DC

## 2015-07-10 ENCOUNTER — Ambulatory Visit (INDEPENDENT_AMBULATORY_CARE_PROVIDER_SITE_OTHER): Payer: Medicare Other | Admitting: *Deleted

## 2015-07-10 DIAGNOSIS — Z5181 Encounter for therapeutic drug level monitoring: Secondary | ICD-10-CM

## 2015-07-10 DIAGNOSIS — Z7901 Long term (current) use of anticoagulants: Secondary | ICD-10-CM | POA: Diagnosis not present

## 2015-07-10 DIAGNOSIS — I4891 Unspecified atrial fibrillation: Secondary | ICD-10-CM

## 2015-07-10 LAB — POCT INR: INR: 2

## 2015-08-14 ENCOUNTER — Ambulatory Visit (INDEPENDENT_AMBULATORY_CARE_PROVIDER_SITE_OTHER): Payer: Medicare Other | Admitting: *Deleted

## 2015-08-14 DIAGNOSIS — Z5181 Encounter for therapeutic drug level monitoring: Secondary | ICD-10-CM

## 2015-08-14 DIAGNOSIS — Z7901 Long term (current) use of anticoagulants: Secondary | ICD-10-CM | POA: Diagnosis not present

## 2015-08-14 DIAGNOSIS — I4891 Unspecified atrial fibrillation: Secondary | ICD-10-CM

## 2015-08-14 LAB — POCT INR: INR: 1.5

## 2015-08-14 MED ORDER — WARFARIN SODIUM 2.5 MG PO TABS: ORAL_TABLET | ORAL | Status: DC

## 2015-09-08 ENCOUNTER — Telehealth: Payer: Self-pay | Admitting: Cardiology

## 2015-09-08 ENCOUNTER — Ambulatory Visit (INDEPENDENT_AMBULATORY_CARE_PROVIDER_SITE_OTHER): Payer: Medicare Other | Admitting: *Deleted

## 2015-09-08 DIAGNOSIS — I442 Atrioventricular block, complete: Secondary | ICD-10-CM

## 2015-09-08 NOTE — Telephone Encounter (Signed)
Attempted to confirm remote transmission with pt. No answer and was unable to leave a message.   

## 2015-09-09 NOTE — Progress Notes (Signed)
Remote pacemaker transmission.   

## 2015-09-17 LAB — CUP PACEART REMOTE DEVICE CHECK
Battery Remaining Longevity: 119 mo
Battery Remaining Percentage: 95.5 %
Battery Voltage: 3.02 V
Brady Statistic RV Percent Paced: 99 %
Lead Channel Setting Pacing Amplitude: 2.5 V
Lead Channel Setting Pacing Pulse Width: 0.4 ms
Lead Channel Setting Sensing Sensitivity: 2 mV
MDC IDC LEAD IMPLANT DT: 20160520
MDC IDC LEAD LOCATION: 753860
MDC IDC MSMT LEADCHNL RV IMPEDANCE VALUE: 450 Ohm
MDC IDC MSMT LEADCHNL RV PACING THRESHOLD AMPLITUDE: 0.75 V
MDC IDC MSMT LEADCHNL RV PACING THRESHOLD PULSEWIDTH: 0.4 ms
MDC IDC MSMT LEADCHNL RV SENSING INTR AMPL: 5.2 mV
MDC IDC PG SERIAL: 7699684
MDC IDC SESS DTM: 20161206010511
Pulse Gen Model: 1240

## 2015-09-19 ENCOUNTER — Encounter: Payer: Self-pay | Admitting: Cardiology

## 2015-12-04 ENCOUNTER — Ambulatory Visit (INDEPENDENT_AMBULATORY_CARE_PROVIDER_SITE_OTHER): Payer: Medicare Other | Admitting: *Deleted

## 2015-12-04 DIAGNOSIS — Z5181 Encounter for therapeutic drug level monitoring: Secondary | ICD-10-CM | POA: Diagnosis not present

## 2015-12-04 DIAGNOSIS — I4891 Unspecified atrial fibrillation: Secondary | ICD-10-CM

## 2015-12-04 LAB — POCT INR: INR: 1.6

## 2015-12-08 ENCOUNTER — Ambulatory Visit (INDEPENDENT_AMBULATORY_CARE_PROVIDER_SITE_OTHER): Payer: Medicare Other | Admitting: *Deleted

## 2015-12-08 DIAGNOSIS — I442 Atrioventricular block, complete: Secondary | ICD-10-CM | POA: Diagnosis not present

## 2015-12-12 NOTE — Progress Notes (Signed)
Remote pacemaker transmission.   

## 2015-12-13 LAB — CUP PACEART REMOTE DEVICE CHECK
Battery Remaining Longevity: 118 mo
Battery Remaining Percentage: 95.5 %
Battery Voltage: 3.01 V
Brady Statistic RV Percent Paced: 99 %
Date Time Interrogation Session: 20170305084818
Implantable Lead Location: 753860
Lead Channel Pacing Threshold Amplitude: 0.75 V
Lead Channel Sensing Intrinsic Amplitude: 5.2 mV
Lead Channel Setting Pacing Amplitude: 2.5 V
Lead Channel Setting Sensing Sensitivity: 2 mV
MDC IDC LEAD IMPLANT DT: 20160520
MDC IDC MSMT LEADCHNL RV IMPEDANCE VALUE: 460 Ohm
MDC IDC MSMT LEADCHNL RV PACING THRESHOLD PULSEWIDTH: 0.4 ms
MDC IDC SET LEADCHNL RV PACING PULSEWIDTH: 0.4 ms
Pulse Gen Serial Number: 7699684

## 2015-12-13 NOTE — Progress Notes (Signed)
Normal remote reviewed.  Next Merlin 03/08/16 

## 2015-12-17 ENCOUNTER — Encounter: Payer: Self-pay | Admitting: Cardiology

## 2015-12-18 ENCOUNTER — Ambulatory Visit (INDEPENDENT_AMBULATORY_CARE_PROVIDER_SITE_OTHER): Payer: Medicare Other | Admitting: *Deleted

## 2015-12-18 DIAGNOSIS — Z5181 Encounter for therapeutic drug level monitoring: Secondary | ICD-10-CM

## 2015-12-18 DIAGNOSIS — I4891 Unspecified atrial fibrillation: Secondary | ICD-10-CM | POA: Diagnosis not present

## 2015-12-18 LAB — POCT INR: INR: 2.3

## 2015-12-18 MED ORDER — WARFARIN SODIUM 2.5 MG PO TABS: ORAL_TABLET | ORAL | Status: DC

## 2016-01-08 ENCOUNTER — Ambulatory Visit (INDEPENDENT_AMBULATORY_CARE_PROVIDER_SITE_OTHER): Payer: Medicare Other | Admitting: *Deleted

## 2016-01-08 DIAGNOSIS — I4891 Unspecified atrial fibrillation: Secondary | ICD-10-CM | POA: Diagnosis not present

## 2016-01-08 DIAGNOSIS — Z5181 Encounter for therapeutic drug level monitoring: Secondary | ICD-10-CM

## 2016-01-08 LAB — POCT INR: INR: 2.4

## 2016-01-10 IMAGING — CT CT HEAD W/O CM
1 series · 16 of 30 positions shown, 20 images · non-contrast
Comparison: 12/04/2009

CLINICAL DATA: LOC, diarrhea

EXAM:
CT HEAD WITHOUT CONTRAST
TECHNIQUE: Contiguous axial images were obtained from the base of the skull
through the vertex without intravenous contrast.

[Series 2: headseq 4.8 h37s · axial · 0.50mm/px · z∈[+1232,+1395]mm · 16 of 36 slices shown, 20 images]
[im 2/36  brain]
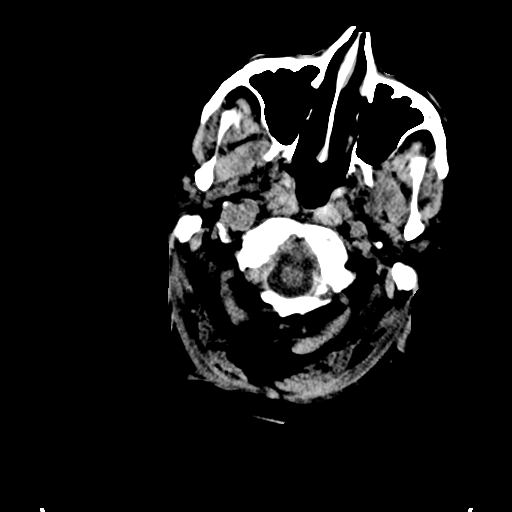
[im 2/36  bone]
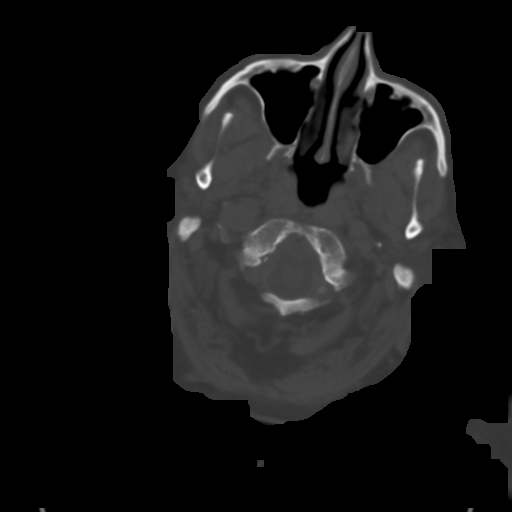
[im 4/36  brain]
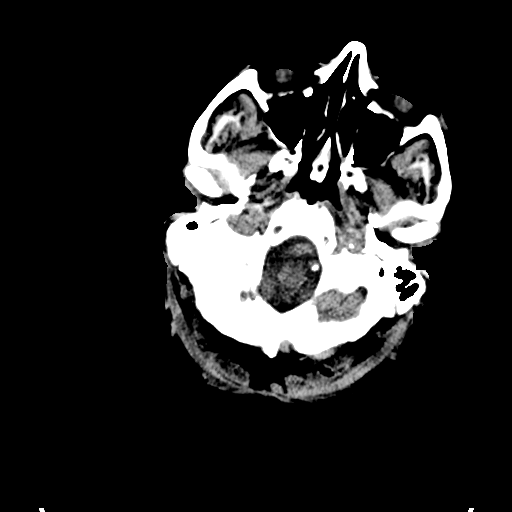
[im 7/36  brain]
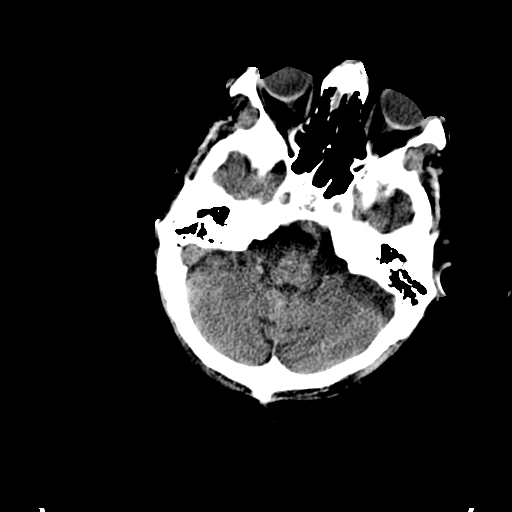
[im 9/36  brain]
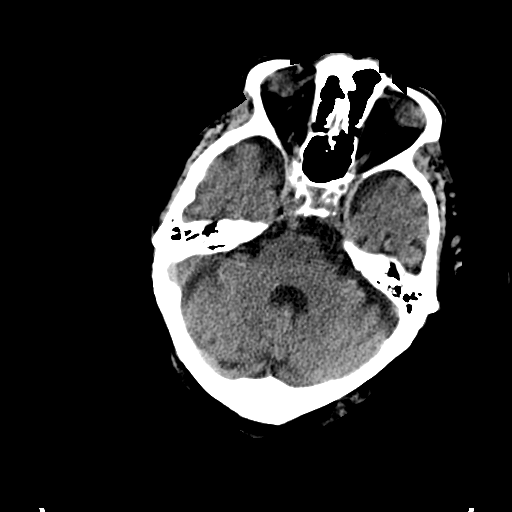
[im 10/36  brain]
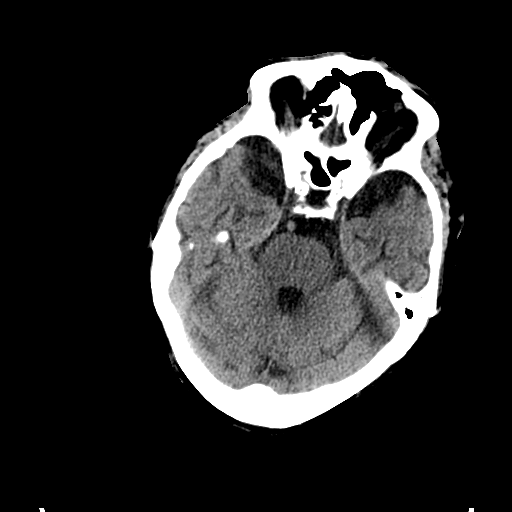
[im 10/36  bone]
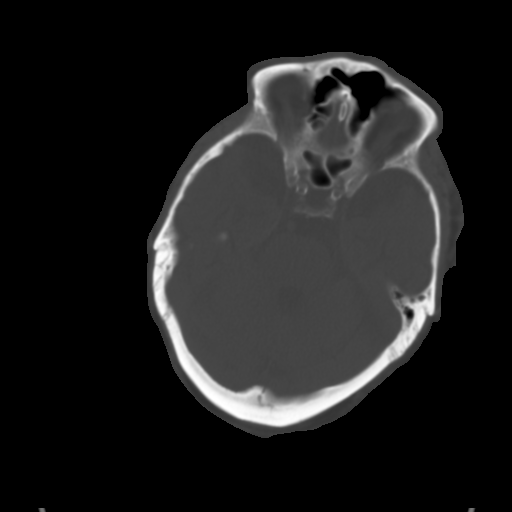
[im 13/36  brain]
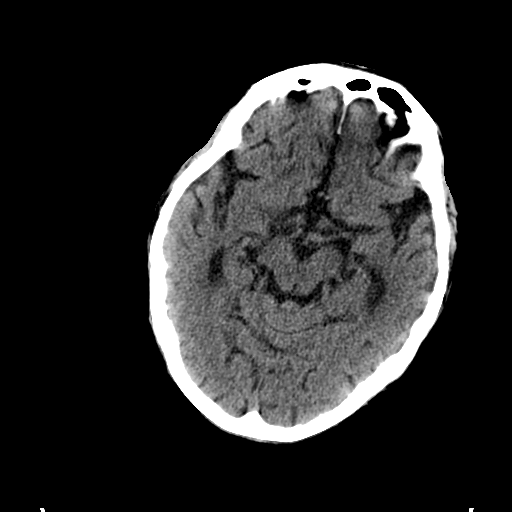
[im 15/36  brain]
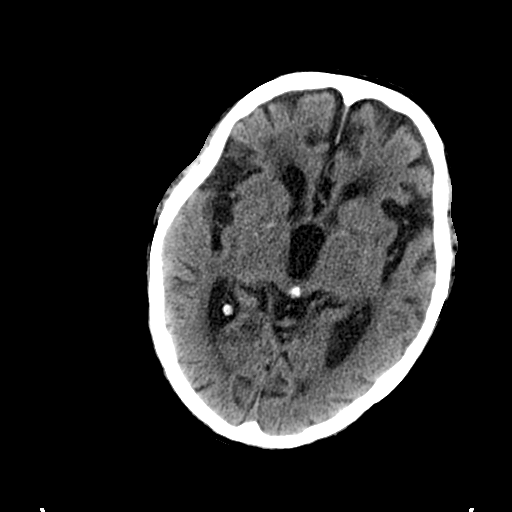
[im 17/36  brain]
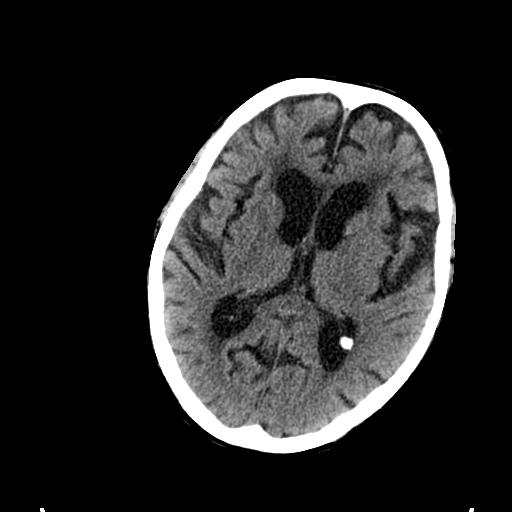
[im 19/36  brain]
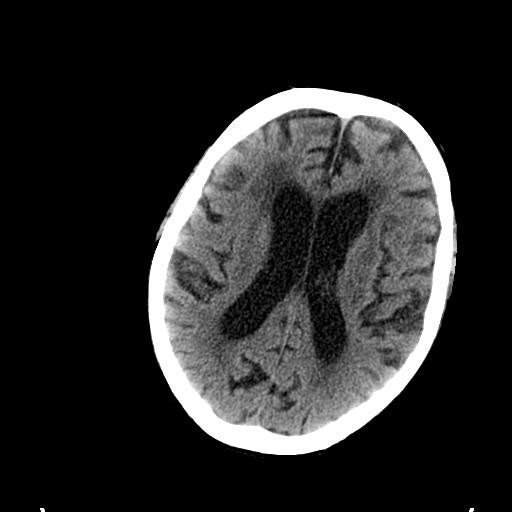
[im 19/36  bone]
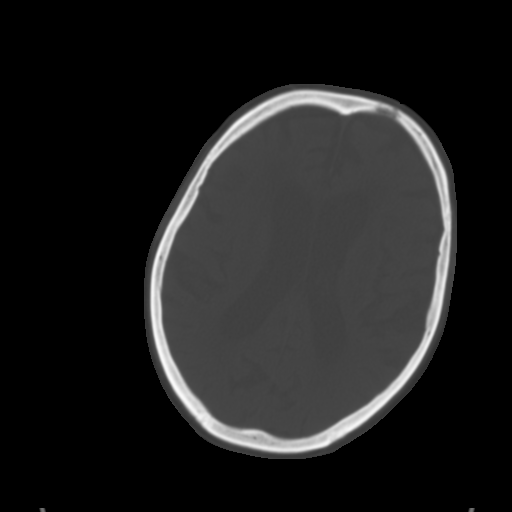
[im 21/36  brain]
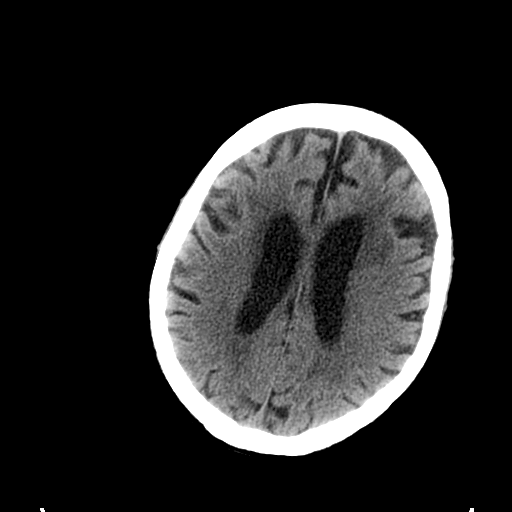
[im 23/36  brain]
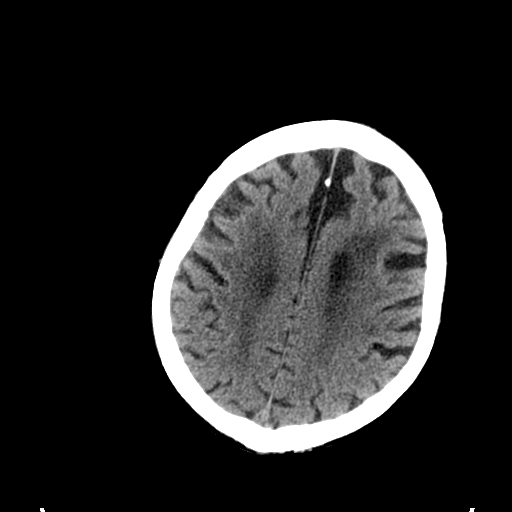
[im 26/36  brain]
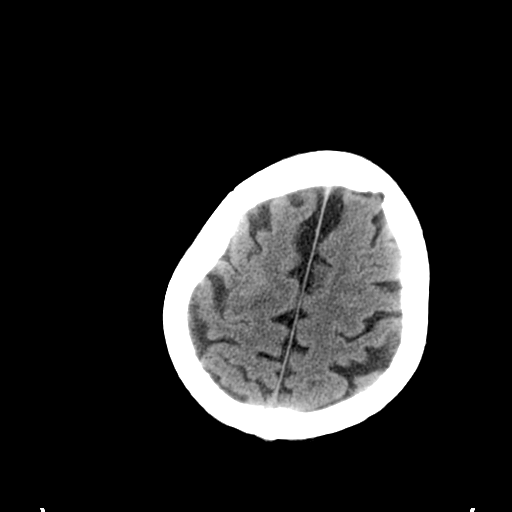
[im 27/36  brain]
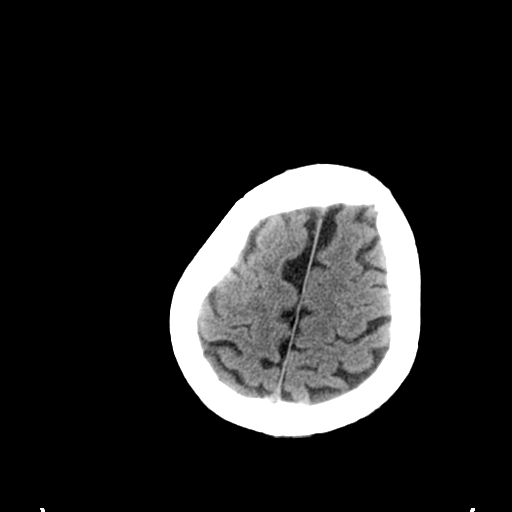
[im 27/36  bone]
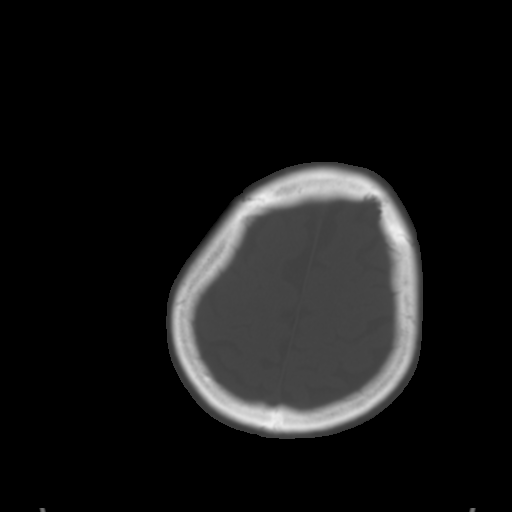
[im 29/36  brain]
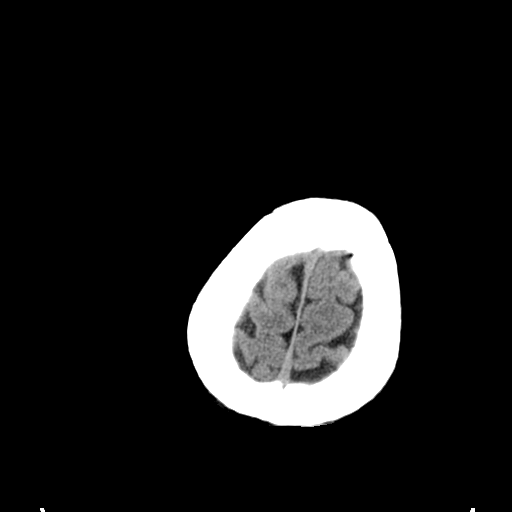
[im 32/36  brain]
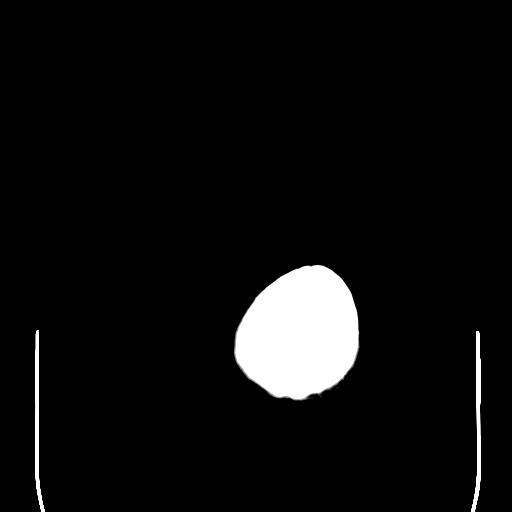
[im 34/36  brain]
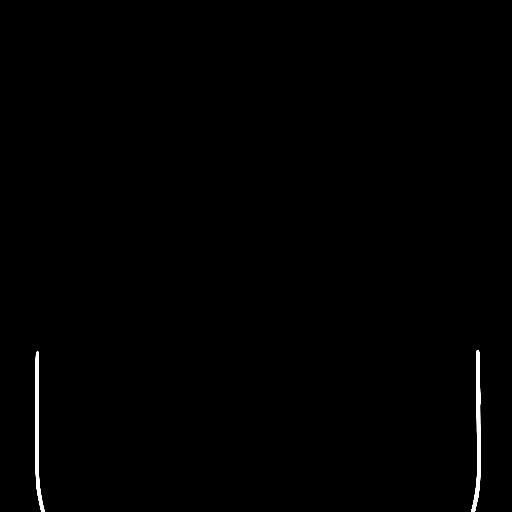

[16 of 30 positions shown; findings below may reference images not displayed]

FINDINGS: No skull fracture is noted. Paranasal sinuses and mastoid air cells
are unremarkable. Atherosclerotic calcifications of carotid siphon
again noted. Stable atrophy and chronic white matter disease. No
acute cortical infarction. No mass lesion is noted on this
unenhanced scan. Ventricular size is stable from prior exam.
IMPRESSION: No acute intracranial abnormality. Stable atrophy and chronic white
matter disease. No acute cortical infarction.

## 2016-01-12 IMAGING — CR DG CHEST 1V PORT
1 series · 1 of 1 positions shown · non-contrast
Comparison: Preoperative chest x-ray 02/20/2015

CLINICAL DATA: 79-year-old male status post placement of cardiac
device

EXAM:
PORTABLE CHEST - 1 VIEW

[AP]
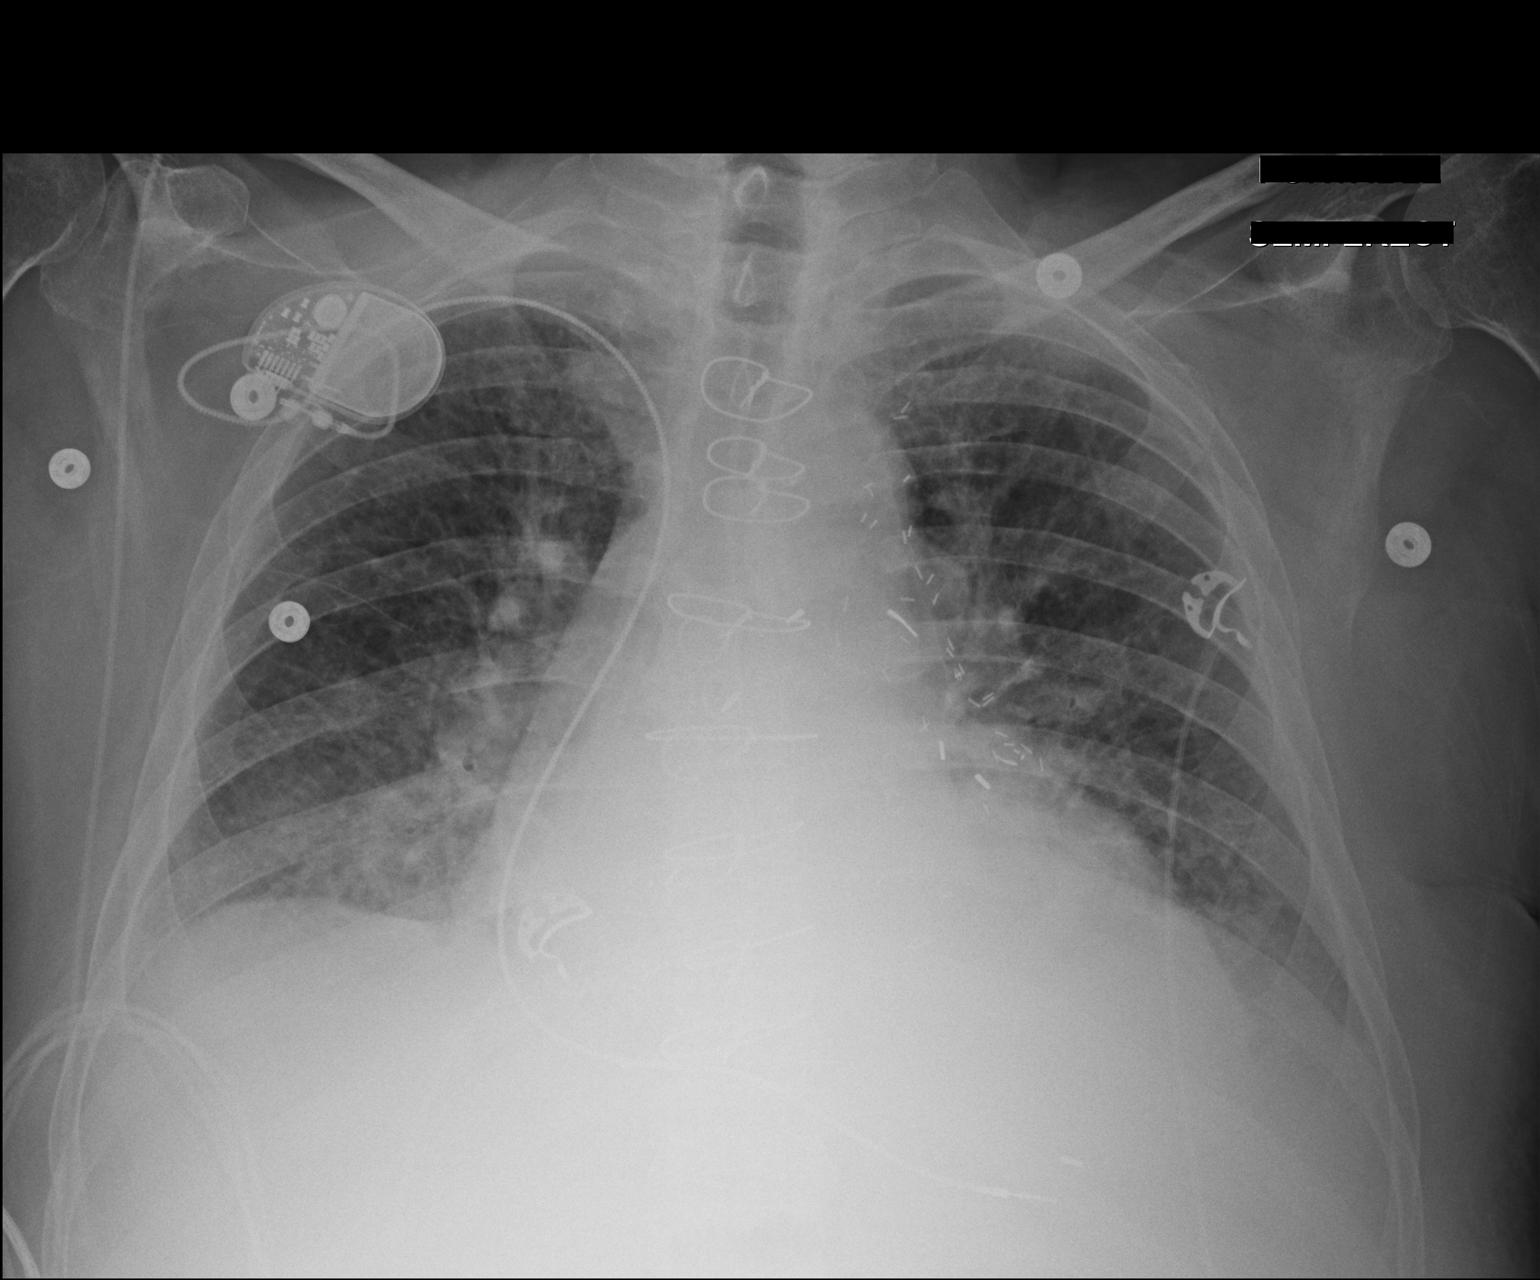

[1 of 1 positions shown; findings below may reference images not displayed]

FINDINGS: Interval placement of a right subclavian approach single lead
cardiac rhythm maintenance device. The lead projects over the right
ventricle. Cardiomegaly with left heart enlargement. Patient is
status post median sternotomy with evidence of prior multivessel
CABG including [REDACTED] bypass. Slightly increased pulmonary vascular
congestion without overt edema. No evidence of pneumothorax or large
pleural effusion. Inspiratory volumes are slightly low with mild
bibasilar atelectasis. No acute osseous abnormality.
IMPRESSION: 1. New right subclavian approach single lead cardiac rhythm
maintenance device. The lead projects over the right ventricle.
2. No evidence of pneumothorax.
3. Slightly increased pulmonary vascular congestion without overt
edema.
4. Lower inspiratory volumes with bibasilar atelectasis.

## 2016-01-14 IMAGING — CR DG ABD PORTABLE 1V
2 series · 2 of 2 positions shown · non-contrast
Comparison: None.

CLINICAL DATA: Lower abdominal pain and weakness

EXAM:
PORTABLE ABDOMEN - 1 VIEW

[supine ap (1 of 2)]
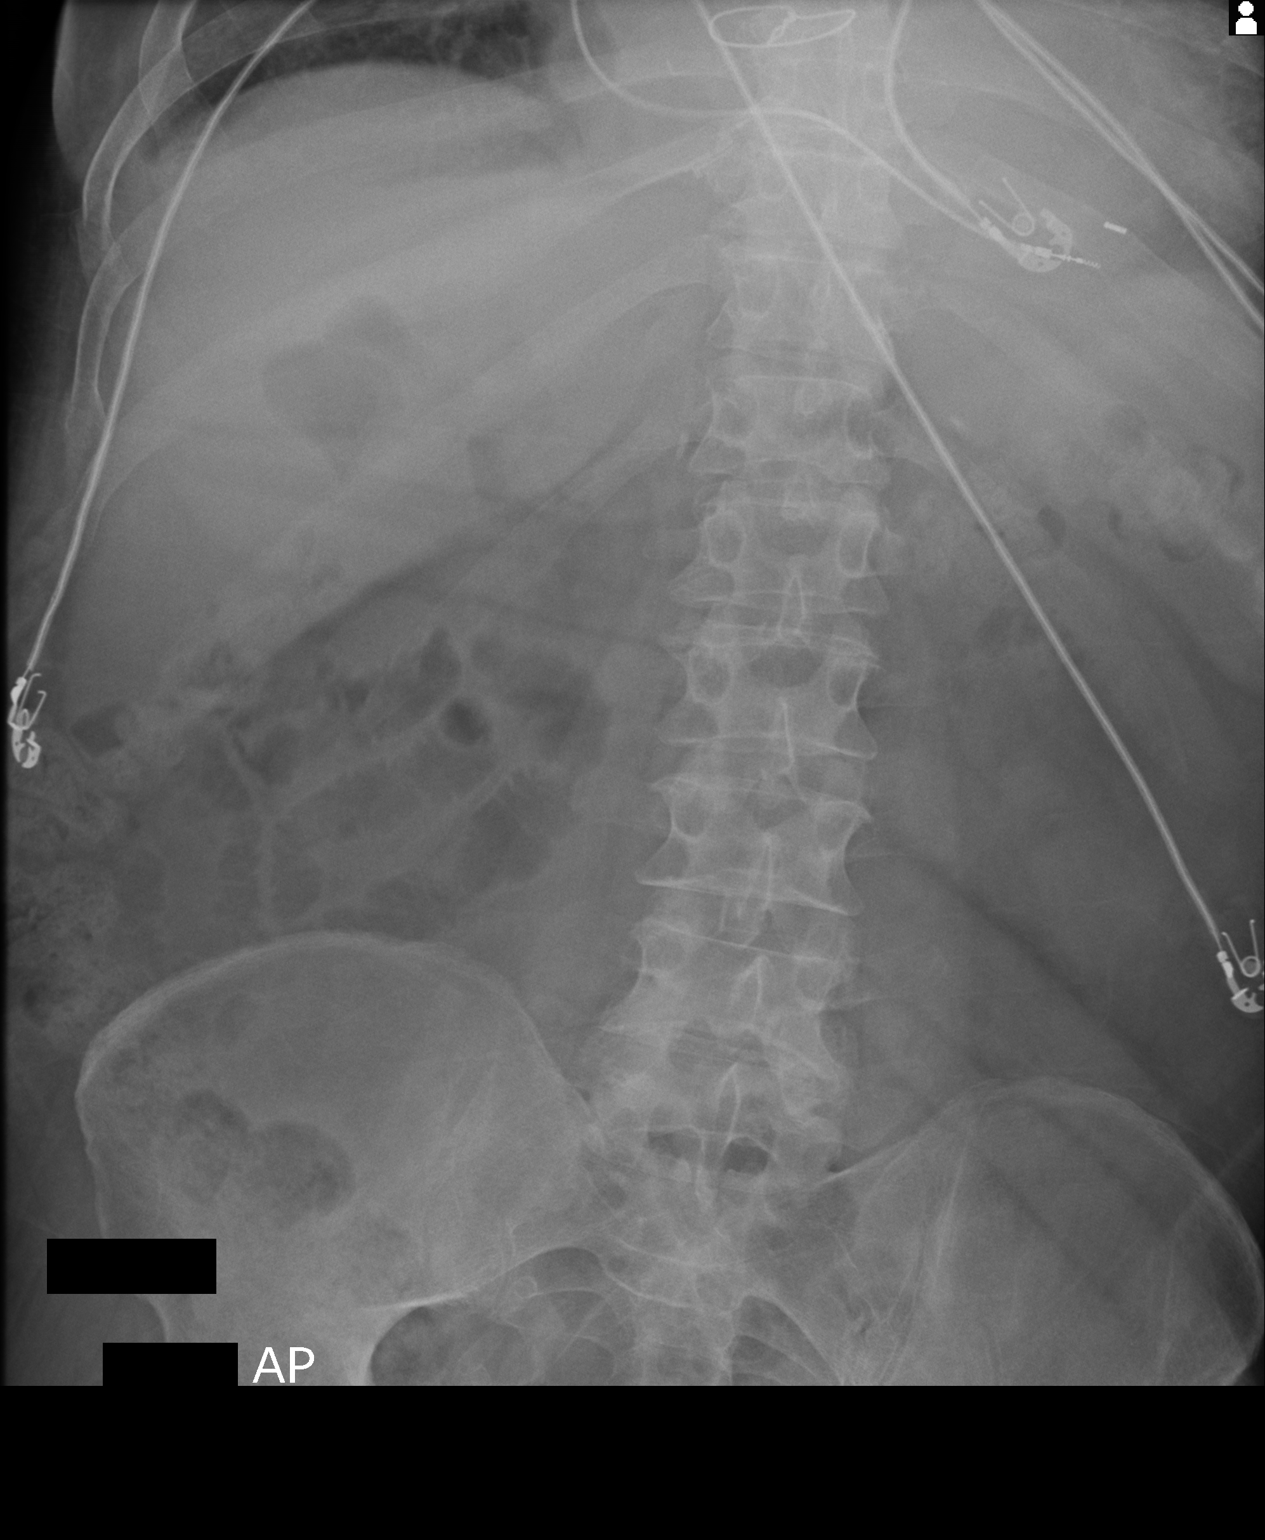

[supine ap (2 of 2)]
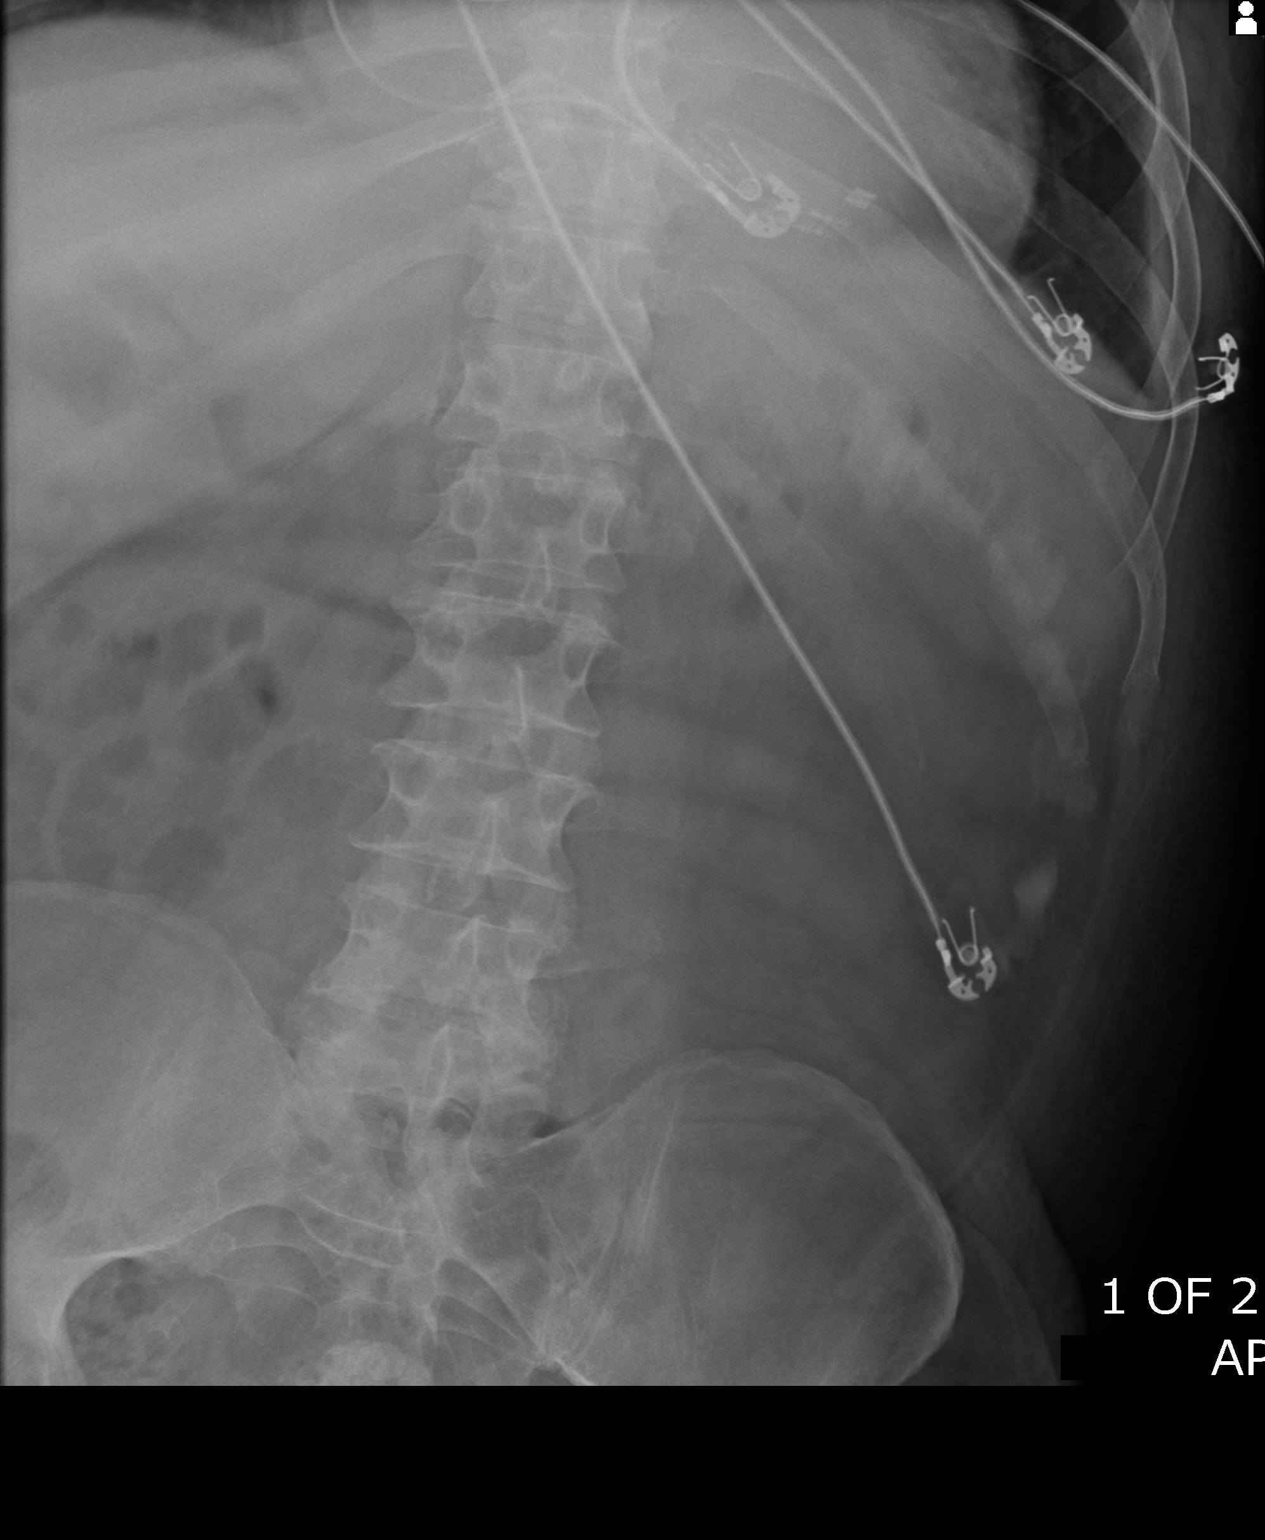

[2 of 2 positions shown; findings below may reference images not displayed]

FINDINGS: Bowel gas pattern unremarkable. No obstruction or free air is seen
on this supine examination. There is contrast in the rectum. There
are vascular calcifications in the pelvis.
IMPRESSION: Bowel gas pattern unremarkable.

## 2016-02-12 ENCOUNTER — Ambulatory Visit (INDEPENDENT_AMBULATORY_CARE_PROVIDER_SITE_OTHER): Payer: Medicare Other | Admitting: *Deleted

## 2016-02-12 DIAGNOSIS — Z5181 Encounter for therapeutic drug level monitoring: Secondary | ICD-10-CM | POA: Diagnosis not present

## 2016-02-12 DIAGNOSIS — I4891 Unspecified atrial fibrillation: Secondary | ICD-10-CM | POA: Diagnosis not present

## 2016-02-12 LAB — POCT INR: INR: 3

## 2016-02-12 MED ORDER — WARFARIN SODIUM 2.5 MG PO TABS: ORAL_TABLET | ORAL | Status: DC

## 2016-03-08 ENCOUNTER — Ambulatory Visit (INDEPENDENT_AMBULATORY_CARE_PROVIDER_SITE_OTHER): Payer: Medicare Other | Admitting: *Deleted

## 2016-03-08 DIAGNOSIS — I442 Atrioventricular block, complete: Secondary | ICD-10-CM

## 2016-03-08 NOTE — Progress Notes (Signed)
Remote pacemaker transmission.   

## 2016-03-18 ENCOUNTER — Ambulatory Visit (INDEPENDENT_AMBULATORY_CARE_PROVIDER_SITE_OTHER): Payer: Medicare Other | Admitting: *Deleted

## 2016-03-18 DIAGNOSIS — Z5181 Encounter for therapeutic drug level monitoring: Secondary | ICD-10-CM | POA: Diagnosis not present

## 2016-03-18 DIAGNOSIS — I4891 Unspecified atrial fibrillation: Secondary | ICD-10-CM | POA: Diagnosis not present

## 2016-03-18 LAB — POCT INR: INR: 2.3

## 2016-03-19 LAB — CUP PACEART REMOTE DEVICE CHECK
Battery Remaining Longevity: 115 mo
Battery Remaining Percentage: 95 %
Brady Statistic RV Percent Paced: 99 %
Implantable Lead Implant Date: 20160520
Implantable Lead Location: 753860
Implantable Lead Model: 5076
Lead Channel Pacing Threshold Amplitude: 0.75 V
Lead Channel Pacing Threshold Pulse Width: 0.4 ms
Lead Channel Sensing Intrinsic Amplitude: 5.2 mV
Lead Channel Setting Pacing Amplitude: 2.5 V
Lead Channel Setting Pacing Pulse Width: 0.4 ms
Lead Channel Setting Sensing Sensitivity: 2 mV
MDC IDC MSMT BATTERY VOLTAGE: 3.02 V
MDC IDC MSMT LEADCHNL RV IMPEDANCE VALUE: 440 Ohm
MDC IDC SESS DTM: 20170605102920
Pulse Gen Serial Number: 7699684

## 2016-03-24 ENCOUNTER — Encounter: Payer: Self-pay | Admitting: Cardiology

## 2016-04-22 ENCOUNTER — Ambulatory Visit (INDEPENDENT_AMBULATORY_CARE_PROVIDER_SITE_OTHER): Payer: Medicare Other | Admitting: *Deleted

## 2016-04-22 DIAGNOSIS — Z5181 Encounter for therapeutic drug level monitoring: Secondary | ICD-10-CM | POA: Diagnosis not present

## 2016-04-22 DIAGNOSIS — E119 Type 2 diabetes mellitus without complications: Secondary | ICD-10-CM | POA: Diagnosis not present

## 2016-04-22 DIAGNOSIS — I4891 Unspecified atrial fibrillation: Secondary | ICD-10-CM | POA: Diagnosis not present

## 2016-04-22 DIAGNOSIS — H35341 Macular cyst, hole, or pseudohole, right eye: Secondary | ICD-10-CM | POA: Diagnosis not present

## 2016-04-22 LAB — POCT INR: INR: 2.8

## 2016-04-22 MED ORDER — WARFARIN SODIUM 2.5 MG PO TABS: ORAL_TABLET | ORAL | Status: DC

## 2016-06-02 ENCOUNTER — Ambulatory Visit (INDEPENDENT_AMBULATORY_CARE_PROVIDER_SITE_OTHER): Payer: Medicare Other | Admitting: Internal Medicine

## 2016-06-02 ENCOUNTER — Encounter: Payer: Self-pay | Admitting: Internal Medicine

## 2016-06-02 ENCOUNTER — Ambulatory Visit (INDEPENDENT_AMBULATORY_CARE_PROVIDER_SITE_OTHER): Payer: Medicare Other | Admitting: *Deleted

## 2016-06-02 VITALS — BP 112/72 | HR 61 | Ht 70.0 in | Wt 201.0 lb

## 2016-06-02 DIAGNOSIS — I4891 Unspecified atrial fibrillation: Secondary | ICD-10-CM | POA: Diagnosis not present

## 2016-06-02 DIAGNOSIS — Z5181 Encounter for therapeutic drug level monitoring: Secondary | ICD-10-CM | POA: Diagnosis not present

## 2016-06-02 DIAGNOSIS — I442 Atrioventricular block, complete: Secondary | ICD-10-CM

## 2016-06-02 DIAGNOSIS — I472 Ventricular tachycardia: Secondary | ICD-10-CM

## 2016-06-02 DIAGNOSIS — I4729 Other ventricular tachycardia: Secondary | ICD-10-CM

## 2016-06-02 LAB — POCT INR: INR: 3.1

## 2016-06-02 NOTE — Patient Instructions (Signed)
Medication Instructions:  Continue all current medications.  Labwork: none  Testing/Procedures: none  Follow-Up: Your physician wants you to follow up in:  1 year.  You will receive a reminder letter in the mail one-two months in advance.  If you don't receive a letter, please call our office to schedule the follow up appointment - Dr. Johney Frame  Any Other Special Instructions Will Be Listed Below (If Applicable). Remote monitoring is used to monitor your Pacemaker of ICD from home. This monitoring reduces the number of office visits required to check your device to one time per year. It allows [place] to keep an eye on the functioning of your device to ensure it is working properly. You are scheduled for a device check from home on 09/01/2016. You may send your transmission at any time that day. If you have a wireless device, the transmission will be sent automatically. After your physician reviews your transmission, you will receive a postcard with your next transmission date.  If you need a refill on your cardiac medications before your next appointment, please call your pharmacy.

## 2016-06-02 NOTE — Progress Notes (Signed)
Electrophysiology Office Note   Date:  06/02/2016   ID:  [name] DadBen F Spratt, DOB Oct 25, 1934, MRN [id number]  PCP:  Ezequiel KayserPERINI,MARK A, MD   Primary Electrophysiologist: Dr Graciela HusbandsKlein  CC: confusion   History of Present Illness: [name] is a 81 y.o. male who presents today for electrophysiology follow-up.   He has done very well since PPM implant.  He has mild dementia.  He seems to be getting around ok.  Tremor is stable.  Today, he denies symptoms of palpitations, chest pain, orthopnea, PND, lower extremity edema, claudication, dizziness, presyncope, syncope, bleeding, or neurologic sequela. The patient is tolerating medications without difficulties and is otherwise without complaint today.    Past Medical History:  Diagnosis Date  . Anxiety   . BPH (benign prostatic hypertrophy)   . CAD (coronary artery disease)    a. s/p CABG  . Degenerative joint disease   . Depression   . Diabetes mellitus   . GERD (gastroesophageal reflux disease)   . Gynecomastia   . Hyperlipidemia   . Hypertension   . Ischemic cardiomyopathy    a. prior CRTD implantation with subcutaneous array b. s/p device system extraction 2/2 infection  . Permanent atrial fibrillation (HCC)   . VT (ventricular tachycardia) (HCC)    a. s/p ablation   Past Surgical History:  Procedure Laterality Date  . ABLATION OF DYSRHYTHMIC FOCUS    . CARDIAC CATHETERIZATION  12/25/2008   THE LEFT VENTRICLE WAS ENLARGED. THERE IS ANTERIOR APICAL AND INFERIOR APICAL AKINESIA. EF ESTIMATED 20%. NO MITRAL REGURGITATION.  Marland Kitchen. CARDIAC CATHETERIZATION  12/2008   REPEAT CATH, SHOWED BYPASS GRAFTS WERE PATENT  . CARDIAC DEFIBRILLATOR REMOVAL    . CARDIOVERSION N/A 11/20/2012   Procedure: CARDIOVERSION;  Surgeon: Pricilla RifflePaula V Ross, MD;  [facility];  Service: Cardiovascular;  Laterality: N/A;  . CARDIOVERSION N/A 12/28/2012   Procedure: CARDIOVERSION;  Surgeon: Cassell Clementhomas Brackbill, MD;  [facility];  Service: Cardiovascular;   Laterality: N/A;  . CATARACT EXTRACTION, BILATERAL    . CORONARY ARTERY BYPASS GRAFT  2000   X5  . EP IMPLANTABLE DEVICE N/A 02/21/2015   SJM single chamber pacemaker implanted for complete heart block and permanent afib by Dr Johney FrameAllred  . PARS PLANA VITRECTOMY  09/24/2011   Procedure: PARS PLANA VITRECTOMY WITH 25 GAUGE;  Surgeon: Alford HighlandGary A Rankin;  Location: [facility] OR;  Service: Ophthalmology;  Laterality: Left;  MEMBRANE PEEL, SILICONE OIL  . US ECHOCARDIOGRAPHY  2007   EF 20 to 25%     Current Outpatient Prescriptions  Medication Sig Dispense Refill  . amiodarone (PACERONE) 200 MG tablet Take 0.5 tablets (100 mg total) by mouth daily. 15 tablet 6  . atorvastatin (LIPITOR) 20 MG tablet Take 1 tablet (20 mg total) by mouth daily at 6 PM.    . carvedilol (COREG) 3.125 MG tablet Take 1 tablet (3.125 mg total) by mouth 2 (two) times daily with a meal.    . finasteride (PROSCAR) 5 MG tablet Take 5 mg by mouth daily.     . furosemide (LASIX) 40 MG tablet Take 1 tablet (40 mg total) by mouth daily. 30 tablet   . lactose free nutrition (BOOST) LIQD Take 237 mLs by mouth 3 (three) times daily between meals.    Marland Kitchen. levothyroxine (SYNTHROID, LEVOTHROID) 88 MCG tablet Take 88 mcg by mouth daily before breakfast.    . losartan (COZAAR) 50 MG tablet Take 0.5 tablets (25 mg total) by mouth daily. 30 tablet 6  . saccharomyces  boulardii (FLORASTOR) 250 MG capsule Take 1 capsule (250 mg total) by mouth 2 (two) times daily.    Marland Kitchen warfarin (COUMADIN) 2.5 MG tablet Take 1 tablet daily except 2 tablets on Sundays or as directed 40 tablet 3   No current facility-administered medications for this visit.     Allergies:   Review of patient's allergies indicates no known allergies.   Social History:  The patient  reports that he has never smoked. He has never used smokeless tobacco. He reports that he does not drink alcohol or use drugs.   Family History:  The patient's family history includes Heart failure in his  father, mother, and sister.    ROS:  Please see the history of present illness.   All other systems are reviewed and negative.    PHYSICAL EXAM: VS:  BP 112/72   Pulse 61   Ht 5\' 10"  (1.778 m)   Wt 201 lb (91.2 kg)   SpO2 97%   BMI 28.84 kg/m  , BMI Body mass index is 28.84 kg/m. GEN: disheveled, in no acute distress  HEENT: normal  Neck: no JVD, carotid bruits, or masses Cardiac: RRR; no murmurs, rubs, or gallops,no edema  Respiratory:  clear to auscultation bilaterally, normal work of breathing GI: soft, nontender, nondistended, + BS MS: no deformity or atrophy  Skin: warm and dry, R sided device pocket is well healed Neuro:  Strength and sensation are intact, resting upper extremity tremor Psych: euthymic mood, full affect  Device interrogation is reviewed today in detail.  See PaceArt for details.  Lipid Panel     Component Value Date/Time   CHOL 167 06/18/2013 1226   TRIG 192.0 (H) 06/18/2013 1226   HDL 31.10 (L) 06/18/2013 1226   CHOLHDL 5 06/18/2013 1226   VLDL 38.4 06/18/2013 1226   LDLCALC 98 06/18/2013 1226     Wt Readings from Last 3 Encounters:  06/02/16 201 lb (91.2 kg)  06/06/15 179 lb (81.2 kg)  04/24/15 179 lb (81.2 kg)    ASSESSMENT AND PLAN:  1.  Complete heart block Normal pacemaker function See Pace Art report No changes today  2. Permanent afib On coumadin  3. VT Prior h/o VT ICD implanted by Dr Graciela Husbands and later extracted due to infection Felt to be a poor candidate for ICD at recent ppm implantation Has been on low dose amiodarone for years--> may be contributing to tremors,  Would be reluctant to stop this however.  Now on 100mg  daily He wishes to have PCP follow LFTs, TFTs.  He was somehow under the impression that I did not want him to follow with primary care.  I today have been very clear that he should continue to follow closely with Dr Waynard Edwards (his PCP).  I have stressed the importance of this relationship.  I have advised that  he have LFTs, TFTs followed by primary care twice per year.  Follow-up: merlin Return in 1 year He states that he has follow-up with   Current medicines are reviewed at length with the patient today.   The patient does not have concerns regarding his medicines.  The following changes were made today:  none  Signed, Hillis Range, MD  06/02/2016 10:37 AM     [facility] [address] [place] [address] [phone] (office) [phone] (fax)

## 2016-06-03 LAB — CUP PACEART INCLINIC DEVICE CHECK
Battery Voltage: 3.01 V
Brady Statistic RV Percent Paced: 99.87 %
Implantable Lead Model: 5076
Lead Channel Impedance Value: 425 Ohm
Lead Channel Pacing Threshold Amplitude: 0.75 V
Lead Channel Pacing Threshold Pulse Width: 0.4 ms
Lead Channel Sensing Intrinsic Amplitude: 5.2 mV
Lead Channel Setting Pacing Amplitude: 2.5 V
Lead Channel Setting Pacing Pulse Width: 0.4 ms
MDC IDC LEAD IMPLANT DT: 20160520
MDC IDC LEAD LOCATION: 753860
MDC IDC MSMT BATTERY REMAINING LONGEVITY: 117.6
MDC IDC MSMT LEADCHNL RV PACING THRESHOLD AMPLITUDE: 0.75 V
MDC IDC MSMT LEADCHNL RV PACING THRESHOLD PULSEWIDTH: 0.4 ms
MDC IDC SESS DTM: 20170830135136
MDC IDC SET LEADCHNL RV SENSING SENSITIVITY: 2 mV
Pulse Gen Model: 1240
Pulse Gen Serial Number: 7699684

## 2016-06-04 ENCOUNTER — Encounter: Payer: Medicare Other | Admitting: Internal Medicine

## 2016-06-27 DIAGNOSIS — Z23 Encounter for immunization: Secondary | ICD-10-CM | POA: Diagnosis not present

## 2016-06-30 DIAGNOSIS — I480 Paroxysmal atrial fibrillation: Secondary | ICD-10-CM | POA: Diagnosis not present

## 2016-06-30 DIAGNOSIS — D519 Vitamin B12 deficiency anemia, unspecified: Secondary | ICD-10-CM | POA: Diagnosis not present

## 2016-06-30 DIAGNOSIS — Z950 Presence of cardiac pacemaker: Secondary | ICD-10-CM | POA: Diagnosis not present

## 2016-06-30 DIAGNOSIS — I10 Essential (primary) hypertension: Secondary | ICD-10-CM | POA: Diagnosis not present

## 2016-06-30 DIAGNOSIS — E038 Other specified hypothyroidism: Secondary | ICD-10-CM | POA: Diagnosis not present

## 2016-06-30 DIAGNOSIS — I509 Heart failure, unspecified: Secondary | ICD-10-CM | POA: Diagnosis not present

## 2016-06-30 DIAGNOSIS — M79659 Pain in unspecified thigh: Secondary | ICD-10-CM | POA: Diagnosis not present

## 2016-06-30 DIAGNOSIS — D6489 Other specified anemias: Secondary | ICD-10-CM | POA: Diagnosis not present

## 2016-06-30 DIAGNOSIS — E559 Vitamin D deficiency, unspecified: Secondary | ICD-10-CM | POA: Diagnosis not present

## 2016-06-30 DIAGNOSIS — E1140 Type 2 diabetes mellitus with diabetic neuropathy, unspecified: Secondary | ICD-10-CM | POA: Diagnosis not present

## 2016-07-12 DIAGNOSIS — E611 Iron deficiency: Secondary | ICD-10-CM | POA: Diagnosis not present

## 2016-07-12 DIAGNOSIS — E038 Other specified hypothyroidism: Secondary | ICD-10-CM | POA: Diagnosis not present

## 2016-07-12 DIAGNOSIS — D519 Vitamin B12 deficiency anemia, unspecified: Secondary | ICD-10-CM | POA: Diagnosis not present

## 2016-07-20 ENCOUNTER — Ambulatory Visit (INDEPENDENT_AMBULATORY_CARE_PROVIDER_SITE_OTHER): Payer: Medicare Other | Admitting: *Deleted

## 2016-07-20 DIAGNOSIS — I4891 Unspecified atrial fibrillation: Secondary | ICD-10-CM | POA: Diagnosis not present

## 2016-07-20 DIAGNOSIS — Z5181 Encounter for therapeutic drug level monitoring: Secondary | ICD-10-CM | POA: Diagnosis not present

## 2016-07-20 LAB — POCT INR: INR: 5.9

## 2016-07-20 MED ORDER — WARFARIN SODIUM 2.5 MG PO TABS: ORAL_TABLET | ORAL | 3 refills | Status: AC

## 2016-07-27 ENCOUNTER — Ambulatory Visit (INDEPENDENT_AMBULATORY_CARE_PROVIDER_SITE_OTHER): Payer: Medicare Other | Admitting: *Deleted

## 2016-07-27 DIAGNOSIS — Z5181 Encounter for therapeutic drug level monitoring: Secondary | ICD-10-CM

## 2016-07-27 DIAGNOSIS — I4891 Unspecified atrial fibrillation: Secondary | ICD-10-CM | POA: Diagnosis not present

## 2016-07-27 LAB — POCT INR: INR: 2.8

## 2016-09-01 ENCOUNTER — Encounter: Payer: Medicare Other | Admitting: *Deleted

## 2016-09-01 ENCOUNTER — Telehealth: Payer: Self-pay | Admitting: Cardiology

## 2016-09-01 NOTE — Telephone Encounter (Signed)
Spoke with pt and reminded pt of remote transmission that is due today. Pt verbalized understanding.   

## 2016-09-02 ENCOUNTER — Ambulatory Visit (INDEPENDENT_AMBULATORY_CARE_PROVIDER_SITE_OTHER): Payer: Medicare Other | Admitting: *Deleted

## 2016-09-02 DIAGNOSIS — I4891 Unspecified atrial fibrillation: Secondary | ICD-10-CM

## 2016-09-02 DIAGNOSIS — Z5181 Encounter for therapeutic drug level monitoring: Secondary | ICD-10-CM

## 2016-09-02 LAB — POCT INR: INR: 5

## 2016-09-03 ENCOUNTER — Encounter: Payer: Self-pay | Admitting: Cardiology

## 2016-09-09 ENCOUNTER — Encounter: Payer: Self-pay | Admitting: Internal Medicine

## 2016-09-14 ENCOUNTER — Ambulatory Visit (INDEPENDENT_AMBULATORY_CARE_PROVIDER_SITE_OTHER): Payer: Medicare Other | Admitting: *Deleted

## 2016-09-14 DIAGNOSIS — Z5181 Encounter for therapeutic drug level monitoring: Secondary | ICD-10-CM

## 2016-09-14 DIAGNOSIS — I4891 Unspecified atrial fibrillation: Secondary | ICD-10-CM | POA: Diagnosis not present

## 2016-09-14 LAB — POCT INR: INR: 3.3

## 2016-10-14 DIAGNOSIS — T1490XA Injury, unspecified, initial encounter: Secondary | ICD-10-CM | POA: Diagnosis not present

## 2016-10-15 ENCOUNTER — Emergency Department (HOSPITAL_COMMUNITY)
Admission: EM | Admit: 2016-10-15 | Discharge: 2016-10-15 | Disposition: A | Discharge status: Home / Self Care | Payer: Medicare Other | Attending: Emergency Medicine | Admitting: Emergency Medicine

## 2016-10-15 DIAGNOSIS — N183 Chronic kidney disease, stage 3 (moderate): Secondary | ICD-10-CM | POA: Insufficient documentation

## 2016-10-15 DIAGNOSIS — Z951 Presence of aortocoronary bypass graft: Secondary | ICD-10-CM | POA: Insufficient documentation

## 2016-10-15 DIAGNOSIS — M25512 Pain in left shoulder: Secondary | ICD-10-CM | POA: Diagnosis not present

## 2016-10-15 DIAGNOSIS — R55 Syncope and collapse: Secondary | ICD-10-CM | POA: Insufficient documentation

## 2016-10-15 DIAGNOSIS — Z7901 Long term (current) use of anticoagulants: Secondary | ICD-10-CM | POA: Diagnosis not present

## 2016-10-15 DIAGNOSIS — I252 Old myocardial infarction: Secondary | ICD-10-CM | POA: Diagnosis not present

## 2016-10-15 DIAGNOSIS — I130 Hypertensive heart and chronic kidney disease with heart failure and stage 1 through stage 4 chronic kidney disease, or unspecified chronic kidney disease: Secondary | ICD-10-CM | POA: Diagnosis not present

## 2016-10-15 DIAGNOSIS — Y939 Activity, unspecified: Secondary | ICD-10-CM | POA: Diagnosis not present

## 2016-10-15 DIAGNOSIS — X58XXXA Exposure to other specified factors, initial encounter: Secondary | ICD-10-CM | POA: Diagnosis not present

## 2016-10-15 DIAGNOSIS — I110 Hypertensive heart disease with heart failure: Secondary | ICD-10-CM | POA: Diagnosis not present

## 2016-10-15 DIAGNOSIS — I2510 Atherosclerotic heart disease of native coronary artery without angina pectoris: Secondary | ICD-10-CM | POA: Insufficient documentation

## 2016-10-15 DIAGNOSIS — S50812A Abrasion of left forearm, initial encounter: Secondary | ICD-10-CM | POA: Insufficient documentation

## 2016-10-15 DIAGNOSIS — R531 Weakness: Secondary | ICD-10-CM | POA: Insufficient documentation

## 2016-10-15 DIAGNOSIS — R404 Transient alteration of awareness: Secondary | ICD-10-CM | POA: Diagnosis not present

## 2016-10-15 DIAGNOSIS — N390 Urinary tract infection, site not specified: Secondary | ICD-10-CM | POA: Insufficient documentation

## 2016-10-15 DIAGNOSIS — Y929 Unspecified place or not applicable: Secondary | ICD-10-CM | POA: Diagnosis not present

## 2016-10-15 DIAGNOSIS — R319 Hematuria, unspecified: Secondary | ICD-10-CM

## 2016-10-15 DIAGNOSIS — I5023 Acute on chronic systolic (congestive) heart failure: Secondary | ICD-10-CM | POA: Insufficient documentation

## 2016-10-15 DIAGNOSIS — Y999 Unspecified external cause status: Secondary | ICD-10-CM | POA: Diagnosis not present

## 2016-10-15 DIAGNOSIS — E1122 Type 2 diabetes mellitus with diabetic chronic kidney disease: Secondary | ICD-10-CM | POA: Diagnosis not present

## 2016-10-15 LAB — HEPATIC FUNCTION PANEL
ALT: 19 U/L (ref 17–63)
AST: 30 U/L (ref 15–41)
Albumin: 3.3 g/dL — ABNORMAL LOW (ref 3.5–5.0)
Alkaline Phosphatase: 68 U/L (ref 38–126)
Bilirubin, Direct: 1.6 mg/dL — ABNORMAL HIGH (ref 0.1–0.5)
Indirect Bilirubin: 2.1 mg/dL — ABNORMAL HIGH (ref 0.3–0.9)
TOTAL PROTEIN: 5.6 g/dL — AB (ref 6.5–8.1)
Total Bilirubin: 3.7 mg/dL — ABNORMAL HIGH (ref 0.3–1.2)

## 2016-10-15 LAB — BASIC METABOLIC PANEL
Anion gap: 12 (ref 5–15)
BUN: 20 mg/dL (ref 6–20)
CHLORIDE: 105 mmol/L (ref 101–111)
CO2: 22 mmol/L (ref 22–32)
Calcium: 9.1 mg/dL (ref 8.9–10.3)
Creatinine, Ser: 1.63 mg/dL — ABNORMAL HIGH (ref 0.61–1.24)
GFR calc Af Amer: 45 mL/min — ABNORMAL LOW (ref >60)
GFR calc non Af Amer: 38 mL/min — ABNORMAL LOW (ref >60)
GLUCOSE: 113 mg/dL — AB (ref 65–99)
Potassium: 3.3 mmol/L — ABNORMAL LOW (ref 3.5–5.1)
Sodium: 139 mmol/L (ref 135–145)

## 2016-10-15 LAB — URINALYSIS, ROUTINE W REFLEX MICROSCOPIC
BACTERIA UA: NONE SEEN
Bilirubin Urine: NEGATIVE
Glucose, UA: NEGATIVE mg/dL
KETONES UR: NEGATIVE mg/dL
Leukocytes, UA: NEGATIVE
Nitrite: NEGATIVE
PROTEIN: 30 mg/dL — AB
SQUAMOUS EPITHELIAL / LPF: NONE SEEN
Specific Gravity, Urine: 1.024 (ref 1.005–1.030)
pH: 5 (ref 5.0–8.0)

## 2016-10-15 LAB — CBC
HEMATOCRIT: 31.2 % — AB (ref 39.0–52.0)
Hemoglobin: 9.1 g/dL — ABNORMAL LOW (ref 13.0–17.0)
MCH: 22.4 pg — ABNORMAL LOW (ref 26.0–34.0)
MCHC: 29.2 g/dL — AB (ref 30.0–36.0)
MCV: 76.7 fL — AB (ref 78.0–100.0)
Platelets: 190 10*3/uL (ref 150–400)
RBC: 4.07 MIL/uL — ABNORMAL LOW (ref 4.22–5.81)
RDW: 22.1 % — AB (ref 11.5–15.5)
WBC: 6.9 10*3/uL (ref 4.0–10.5)

## 2016-10-15 MED ORDER — CEPHALEXIN 500 MG PO CAPS: 500.0000 mg | ORAL_CAPSULE | Freq: Three times a day (TID) | ORAL | 0 refills | Status: AC

## 2016-10-15 MED ORDER — SODIUM CHLORIDE 0.9 % IV BOLUS (SEPSIS)
1000.0000 mL | Freq: Once | INTRAVENOUS | Status: AC
  Administered 2016-10-15: 1000 mL via INTRAVENOUS

## 2016-10-15 MED ORDER — CEPHALEXIN 250 MG PO CAPS
500.0000 mg | ORAL_CAPSULE | Freq: Once | ORAL | Status: AC
  Administered 2016-10-15: 500 mg via ORAL
  Filled 2016-10-15: qty 2

## 2016-10-15 MED ORDER — CEPHALEXIN 500 MG PO CAPS: 500.0000 mg | ORAL_CAPSULE | Freq: Three times a day (TID) | ORAL | 0 refills | Status: DC

## 2016-10-15 NOTE — ED Provider Notes (Signed)
[facility] Provider Note   CSN: [id number] Arrival date & time: 10/15/16  0219   By signing my name below, I, Clovis Pu, attest that this documentation has been prepared under the direction and in the presence of Azalia Bilis, MD  Electronically Signed: Clovis Pu, ED Scribe. 10/15/16. 3:22 AM.   History   Chief Complaint Chief Complaint  Patient presents with  . Loss of Consciousness   The history is provided by the patient. No language interpreter was used.   HPI Comments:  [name] is a 82 y.o. male, with a hx of CAD, a-fib, hyperlipidemia, HTN and ischemic cardiomyopathy, who presents to the Emergency Department, via EMS, status post a sudden onset syncopal episode which occurred prior to arrival. Pt states he felt lightheaded prior to syncope and notes he has not been eating due to a lack of food at home. He also reports diarrhea. Pt denies nausea and vomiting. No other associated symptoms noted. Pt also complains of a sudden onset abrasion to his left forearm x yesterday.   PCP: Dr. Darcella Cheshire  Past Medical History:  Diagnosis Date  . Anxiety   . BPH (benign prostatic hypertrophy)   . CAD (coronary artery disease)    a. s/p CABG  . Degenerative joint disease   . Depression   . Diabetes mellitus   . GERD (gastroesophageal reflux disease)   . Gynecomastia   . Hyperlipidemia   . Hypertension   . Ischemic cardiomyopathy    a. prior CRTD implantation with subcutaneous array b. s/p device system extraction 2/2 infection  . Permanent atrial fibrillation (HCC)   . VT (ventricular tachycardia) (HCC)    a. s/p ablation    Patient Active Problem List   Diagnosis Date Noted  . Hyperlipidemia LDL goal <100 03/25/2015  . Cardiac pacemaker 03/25/2015  . Acute on chronic systolic congestive heart failure (HCC)   . Weakness   . CHF (congestive heart failure) (HCC) 02/24/2015  . Type 2 diabetes mellitus with diabetic chronic kidney disease (HCC)   . C.  difficile colitis 02/21/2015  . Complete heart block (HCC) 02/21/2015  . Atrial fibrillation, unspecified   . Bradycardia 02/20/2015  . Witnessed apneic spells 02/18/2015  . Second degree Mobitz II AV block   . Hypothyroidism 02/16/2015  . Dyspnea 02/16/2015  . Abnormal urinalysis 02/16/2015  . Chronic systolic CHF (congestive heart failure) (HCC)   . Chronic systolic heart failure (HCC) 02/15/2015  . DM (diabetes mellitus), type 2 with renal complications (HCC) 02/15/2015  . CKD (chronic kidney disease), stage III 02/15/2015  . Bradycardia with 31 - 40 beats per minute 02/14/2015  . Elevated INR (international normalized ratio) due to prior anticoagulant medication ingestion 02/14/2015  . Acute renal injury (HCC) 02/14/2015  . Diarrhea 02/14/2015  . Dehydration 02/14/2015  . Encounter for therapeutic drug monitoring 11/06/2013  . Orthostatic lightheadedness 11/14/2012  . CAD (coronary artery disease)   . Chronic anticoagulation 03/02/2011  . Atrial fibrillation (HCC) 12/29/2010  . Ischemic cardiomyopathy 02/26/2009  . SINOATRIAL NODE DYSFUNCTION 02/26/2009  . SYSTOLIC HEART FAILURE, ACUTE ON CHRONIC 02/26/2009  . TREMOR 02/26/2009  . HYPERLIPIDEMIA-MIXED 01/09/2009  . ANEMIA 01/09/2009  . HYPERTENSION, UNSPECIFIED 01/09/2009  . MYOCARDIAL INFARCTION 01/09/2009  . VENTRICULAR TACHYCARDIA 01/09/2009  . MURAL THROMBUS, APEX OF HEART 01/09/2009  . GASTROESOPHAGEAL REFLUX DISEASE, HX OF 01/09/2009    Past Surgical History:  Procedure Laterality Date  . ABLATION OF DYSRHYTHMIC FOCUS    . CARDIAC CATHETERIZATION  12/25/2008  THE LEFT VENTRICLE WAS ENLARGED. THERE IS ANTERIOR APICAL AND INFERIOR APICAL AKINESIA. EF ESTIMATED 20%. NO MITRAL REGURGITATION.  Marland Kitchen CARDIAC CATHETERIZATION  12/2008   REPEAT CATH, SHOWED BYPASS GRAFTS WERE PATENT  . CARDIAC DEFIBRILLATOR REMOVAL    . CARDIOVERSION N/A 11/20/2012   Procedure: CARDIOVERSION;  Surgeon: Pricilla Riffle, MD;  [facility]  ENDOS[facility];  Service: Cardiovascular;  Laterality: N/A;  . CARDIOVERSION N/A 12/28/2012   Procedure: CARDIOVERSION;  Surgeon: Cassell Clement, MD;  Location: [facility] ENDOSCOPY;  Service: Cardiovascular;  Laterality: N/A;  . CATARACT EXTRACTION, BILATERAL    . CORONARY ARTERY BYPASS GRAFT  2000   X5  . EP IMPLANTABLE DEVICE N/A 02/21/2015   SJM single chamber pacemaker implanted for complete heart block and permanent afib by Dr Johney Frame  . PARS PLANA VITRECTOMY  09/24/2011   Procedure: PARS PLANA VITRECTOMY WITH 25 GAUGE;  Surgeon: Alford Highland Rankin;  Location: [facility] OR;  Service: Ophthalmology;  Laterality: Left;  MEMBRANE PEEL, SILICONE OIL  . US ECHOCARDIOGRAPHY  2007   EF 20 to 25%       Home Medications    Prior to Admission medications   Medication Sig Start Date End Date Taking? Authorizing Provider  amiodarone (PACERONE) 200 MG tablet Take 0.5 tablets (100 mg total) by mouth daily. 02/27/15   Rhetta Mura, MD  atorvastatin (LIPITOR) 20 MG tablet Take 1 tablet (20 mg total) by mouth daily at 6 PM. 02/27/15   Rhetta Mura, MD  carvedilol (COREG) 3.125 MG tablet Take 1 tablet (3.125 mg total) by mouth 2 (two) times daily with a meal. 02/27/15   Rhetta Mura, MD  finasteride (PROSCAR) 5 MG tablet Take 5 mg by mouth daily.  02/02/15   Historical Provider, MD  furosemide (LASIX) 40 MG tablet Take 1 tablet (40 mg total) by mouth daily. 02/27/15   Rhetta Mura, MD  lactose free nutrition (BOOST) LIQD Take 237 mLs by mouth 3 (three) times daily between meals.    Historical Provider, MD  levothyroxine (SYNTHROID, LEVOTHROID) 88 MCG tablet Take 88 mcg by mouth daily before breakfast.    Historical Provider, MD  losartan (COZAAR) 50 MG tablet Take 0.5 tablets (25 mg total) by mouth daily. 08/14/14   Duke Salvia, MD  saccharomyces boulardii (FLORASTOR) 250 MG capsule Take 1 capsule (250 mg total) by mouth 2 (two) times daily. 02/23/15   Rodolph Bong, MD  warfarin (COUMADIN) 2.5  MG tablet Take 1 tablet daily except 2 tablets on Sundays or as directed 07/20/16   Duke Salvia, MD    Family History Family History  Problem Relation Age of Onset  . Heart failure Mother   . Heart disease    . Heart failure Sister   . Heart failure Father     Social History Social History  Substance Use Topics  . Smoking status: Never Smoker  . Smokeless tobacco: Never Used  . Alcohol use No     Comment: hx of in younger years, not now     Allergies   Patient has no known allergies.   Review of Systems Review of Systems 10 systems reviewed and all are negative for acute change except as noted in the HPI.  Physical Exam Updated Vital Signs BP 143/72 (BP Location: Right Arm)   Pulse (!) 58   Temp 97.7 F (36.5 C) (Oral)   Resp 20   Ht 5\' 10"  (1.778 m)   Wt 185 lb (83.9 kg)   SpO2 99%   BMI  26.54 kg/m   Physical Exam  Constitutional: He is oriented to person, place, and time. He appears well-developed and well-nourished.  HENT:  Head: Normocephalic and atraumatic.  Eyes: EOM are normal.  Neck: Normal range of motion.  Cardiovascular: Normal rate, regular rhythm, normal heart sounds and intact distal pulses.   Pulmonary/Chest: Effort normal and breath sounds normal. No respiratory distress.  Abdominal: Soft. He exhibits no distension. There is no tenderness.  Musculoskeletal: Normal range of motion.  Neurological: He is alert and oriented to person, place, and time.  Skin: Skin is warm and dry.  large skin abrasion to posterior forearm  Psychiatric: He has a normal mood and affect. Judgment normal.  Nursing note and vitals reviewed.    ED Treatments / Results  DIAGNOSTIC STUDIES:  Oxygen Saturation is 99% on RA, normal by my interpretation.    COORDINATION OF CARE:  3:18 AM Discussed treatment plan with pt at bedside and pt agreed to plan.  Labs (all labs ordered are listed, but only abnormal results are displayed) Labs Reviewed  BASIC  METABOLIC PANEL - Abnormal; Notable for the following:       Result Value   Potassium 3.3 (*)    Glucose, Bld 113 (*)    Creatinine, Ser 1.63 (*)    GFR calc non Af Amer 38 (*)    GFR calc Af Amer 45 (*)    All other components within normal limits  CBC - Abnormal; Notable for the following:    RBC 4.07 (*)    Hemoglobin 9.1 (*)    HCT 31.2 (*)    MCV 76.7 (*)    MCH 22.4 (*)    MCHC 29.2 (*)    RDW 22.1 (*)    All other components within normal limits  URINALYSIS, ROUTINE W REFLEX MICROSCOPIC - Abnormal; Notable for the following:    Color, Urine AMBER (*)    Hgb urine dipstick LARGE (*)    Protein, ur 30 (*)    All other components within normal limits  HEPATIC FUNCTION PANEL - Abnormal; Notable for the following:    Total Protein 5.6 (*)    Albumin 3.3 (*)    Total Bilirubin 3.7 (*)    Bilirubin, Direct 1.6 (*)    Indirect Bilirubin 2.1 (*)    All other components within normal limits  URINE CULTURE  CBG MONITORING, ED    EKG  EKG Interpretation  Date/Time:  Friday October 15 2016 02:26:49 EST Ventricular Rate:  60 PR Interval:    QRS Duration: 160 QT Interval:  484 QTC Calculation: 484 R Axis:   -67 Text Interpretation:  Sinus rhythm Ventricular premature complex Ventricular-paced rhythm No significant change was found Confirmed by Giulliana Mcroberts  MD, Koa Zoeller ([id number]) on 10/15/2016 2:37:30 AM       Radiology No results found.  Procedures Procedures (including critical care time)  Medications Ordered in ED Medications  sodium chloride 0.9 % bolus 1,000 mL (not administered)     Initial Impression / Assessment and Plan / ED Course  I have reviewed the triage vital signs and the nursing notes.  Pertinent labs & imaging results that were available during my care of the patient were reviewed by me and considered in my medical decision making (see chart for details).  Clinical Course     7:21 AM Patient feels better this time.  Vital signs are stable.  Question  urinary tract infection.  Home with Keflex.  Urine culture sent.  Feels better after  IV fluids.  Reports they ran out of all his medications.  I will give him refills of his medications.  Patient is able to ambulate in the emergency department.  Primary care follow-up.  He is not thriving at home.  He will need outpatient evaluation by his primary care physician for possible placement into a assisted living center.  I spoke with the patient's stepdaughter reports she will follow-up with the primary care physician and start looking for placement for her father-in-law   Final Clinical Impressions(s) / ED Diagnoses   Final diagnoses:  Weakness  Syncope, unspecified syncope type  Urinary tract infection with hematuria, site unspecified    New Prescriptions New Prescriptions   CEPHALEXIN (KEFLEX) 500 MG CAPSULE    Take 1 capsule (500 mg total) by mouth 3 (three) times daily.   I personally performed the services described in this documentation, which was scribed in my presence. The recorded information has been reviewed and is accurate.        Azalia Bilis, MD 10/15/16 [phone]

## 2016-10-15 NOTE — ED Notes (Signed)
Pt tolerating food and drink

## 2016-10-15 NOTE — ED Notes (Signed)
[facility] called for transport.  

## 2016-10-15 NOTE — Care Management Note (Signed)
Case Management Note  Patient Details  Name: [name] MRN: [id number] Date of Birth: 08-May-1935  Subjective/Objective:                  From home with spouse. /81 y.o. male, with a hx of CAD, a-fib, hyperlipidemia, HTN and ischemic cardiomyopathy, who presents to the Emergency Department, via EMS, status post a sudden onset syncopal episode which occurred prior to arrival.   Action/Plan: Follow for disposition needs.   Expected Discharge Date:  10/15/16               Expected Discharge Plan:  Home w Home Health Services  In-House Referral:  NA  Discharge planning Services  CM Consult  Post Acute Care Choice:  Home Health Choice offered to:  Patient  DME Arranged:  N/A DME Agency:  NA  HH Arranged:  RN, Nurse's Aide, Social Work [employer] Agency:  [employer] [facility]  Status of Service:  Completed, signed off  If discussed at [employer], dates discussed:    Additional Comments: Andra Matsuo J. Lucretia Roers, RN, BSN, [employer] [phone] Spoke with pt at bedside regarding discharge planning for [facility]. Offered pt list of home health agencies to choose from.  Pt chose [facility] to render services. Angela Adam of [facility] notified.  No DME needs identified at this time.  Oletta Cohn, RN 10/15/2016, 9:03 AM

## 2016-10-15 NOTE — ED Triage Notes (Signed)
Pt presents from home with [facility] for syncope; EMS states they were called out to residence yesterday for same but pt refused to be transported; wife called 911 today for patient; pt states he recently "ran out of all my medications"; also reports pacemaker placed x 2 months ago?; pt states last episode of syncope, couldn't get self out of floor, not eaten in 2 days d/t reflux symptoms; significant edema throughout; EMS reports patient and wife (demented) live in below average/ poor living conditions; pt A+Ox4 at this time requesting water

## 2016-10-16 LAB — URINE CULTURE: Culture: NO GROWTH

## 2016-10-26 ENCOUNTER — Telehealth: Payer: Self-pay | Admitting: Internal Medicine

## 2016-10-26 NOTE — Telephone Encounter (Signed)
Dr.[name] Allred Signed and completed d/c on patient. Called [facility] they are aware ready for pick up.

## 2016-11-04 ENCOUNTER — Telehealth: Payer: Self-pay | Admitting: Internal Medicine

## 2016-11-04 NOTE — Progress Notes (Signed)
Dr Excell Seltzer received a call from [facility] that pt was found dead at home.  EMS needing someone to sign death certificate.  Per Dr Excell Seltzer the death certificate can be sent to [facility] for Dr Graciela Husbands to sign.  If Dr Graciela Husbands cannot sign death certificate then Dr Excell Seltzer will sign.

## 2016-11-04 NOTE — Telephone Encounter (Signed)
Original d/c received in [facility] will be back in Office Monday 11/08/16 will get him to sign original then.

## 2016-11-04 DEATH — deceased

## 2016-11-08 ENCOUNTER — Telehealth: Payer: Self-pay | Admitting: Internal Medicine

## 2016-11-08 NOTE — Telephone Encounter (Signed)
Dr.Allred signed & completed original d/c mailed to :  [facility] [phone] Jacky Kindle [address]

## 2016-11-11 ENCOUNTER — Telehealth: Payer: Self-pay | Admitting: Internal Medicine

## 2016-11-11 NOTE — Telephone Encounter (Signed)
Received call from [name] who stated she was patients stepdaughter she was calling to see where the Death Certificate was I explained to [place] I could not release this information to her. She stared getting Loud on the phone saying " that's ok that's ok" Ill just get the [facility] to call. I told [name] I was Sorry but I could not release this Information to her. Before I could say anything else she hung up.

## 2016-11-12 ENCOUNTER — Telehealth: Payer: Self-pay | Admitting: Internal Medicine

## 2016-11-12 NOTE — Telephone Encounter (Signed)
[name] with [facility] in [place] [phone] calling to check status on d/c made her aware this was signed and mailed to [facility] ( per envelope I was provided) on 11/08/16.

## 2017-06-10 ENCOUNTER — Encounter: Payer: Medicare Other | Admitting: Internal Medicine

## 2019-02-06 ENCOUNTER — Other Ambulatory Visit: Payer: Self-pay | Admitting: Licensed Clinical Social Worker
